# Patient Record
Sex: Female | Born: 1985 | Race: Black or African American | Hispanic: No | Marital: Single
Health system: Southern US, Academic
[De-identification: ages and names within clinical notes are randomized; demographics above are authoritative.]

## PROBLEM LIST (undated history)

## (undated) ENCOUNTER — Encounter: Attending: Adult Health | Primary: Adult Health

## (undated) ENCOUNTER — Telehealth

## (undated) ENCOUNTER — Encounter

## (undated) ENCOUNTER — Ambulatory Visit: Attending: Clinical | Primary: Clinical

## (undated) ENCOUNTER — Ambulatory Visit

## (undated) ENCOUNTER — Ambulatory Visit: Payer: PRIVATE HEALTH INSURANCE

## (undated) ENCOUNTER — Ambulatory Visit: Attending: Pharmacist | Primary: Pharmacist

## (undated) ENCOUNTER — Ambulatory Visit
Payer: BLUE CROSS/BLUE SHIELD | Attending: Student in an Organized Health Care Education/Training Program | Primary: Student in an Organized Health Care Education/Training Program

## (undated) ENCOUNTER — Ambulatory Visit: Payer: PRIVATE HEALTH INSURANCE | Attending: Adult Health | Primary: Adult Health

## (undated) ENCOUNTER — Encounter
Attending: Student in an Organized Health Care Education/Training Program | Primary: Student in an Organized Health Care Education/Training Program

## (undated) ENCOUNTER — Ambulatory Visit
Payer: BLUE CROSS/BLUE SHIELD | Attending: Rehabilitative and Restorative Service Providers" | Primary: Rehabilitative and Restorative Service Providers"

## (undated) ENCOUNTER — Ambulatory Visit: Payer: BLUE CROSS/BLUE SHIELD

## (undated) ENCOUNTER — Encounter: Attending: Registered" | Primary: Registered"

## (undated) ENCOUNTER — Encounter: Attending: Dermatology | Primary: Dermatology

## (undated) ENCOUNTER — Encounter: Attending: Addiction (Substance Use Disorder) | Primary: Addiction (Substance Use Disorder)

## (undated) ENCOUNTER — Ambulatory Visit: Payer: PRIVATE HEALTH INSURANCE | Attending: Registered" | Primary: Registered"

## (undated) ENCOUNTER — Encounter: Attending: Clinical | Primary: Clinical

## (undated) ENCOUNTER — Ambulatory Visit
Payer: PRIVATE HEALTH INSURANCE | Attending: Student in an Organized Health Care Education/Training Program | Primary: Student in an Organized Health Care Education/Training Program

## (undated) DIAGNOSIS — K859 Acute pancreatitis without necrosis or infection, unspecified: Secondary | ICD-10-CM

## (undated) DIAGNOSIS — L732 Hidradenitis suppurativa: Secondary | ICD-10-CM

## (undated) DIAGNOSIS — K219 Gastro-esophageal reflux disease without esophagitis: Secondary | ICD-10-CM

## (undated) DIAGNOSIS — F329 Major depressive disorder, single episode, unspecified: Secondary | ICD-10-CM

## (undated) DIAGNOSIS — K589 Irritable bowel syndrome without diarrhea: Secondary | ICD-10-CM

## (undated) DIAGNOSIS — F1210 Cannabis abuse, uncomplicated: Secondary | ICD-10-CM

## (undated) DIAGNOSIS — F32A Depression, unspecified: Secondary | ICD-10-CM

## (undated) DIAGNOSIS — F909 Attention-deficit hyperactivity disorder, unspecified type: Secondary | ICD-10-CM

## (undated) DIAGNOSIS — K2970 Gastritis, unspecified, without bleeding: Secondary | ICD-10-CM

## (undated) DIAGNOSIS — F419 Anxiety disorder, unspecified: Secondary | ICD-10-CM

## (undated) HISTORY — DX: Depression, unspecified: F32.A

## (undated) HISTORY — DX: Hidradenitis suppurativa: L73.2

## (undated) HISTORY — DX: Attention-deficit hyperactivity disorder, unspecified type: F90.9

## (undated) HISTORY — PX: WISDOM TOOTH EXTRACTION: SHX21

## (undated) HISTORY — PX: NO PAST SURGERIES: SHX2092

---
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
## 2008-06-21 ENCOUNTER — Emergency Department (HOSPITAL_COMMUNITY): Admission: EM | Admit: 2008-06-21 | Discharge: 2008-06-21 | Payer: Self-pay | Admitting: Emergency Medicine

## 2008-07-09 ENCOUNTER — Observation Stay (HOSPITAL_COMMUNITY): Admission: EM | Admit: 2008-07-09 | Discharge: 2008-07-10 | Payer: Self-pay | Admitting: Emergency Medicine

## 2008-07-10 ENCOUNTER — Emergency Department (HOSPITAL_COMMUNITY): Admission: EM | Admit: 2008-07-10 | Discharge: 2008-07-10 | Payer: Self-pay | Admitting: Emergency Medicine

## 2008-07-11 ENCOUNTER — Observation Stay (HOSPITAL_COMMUNITY): Admission: EM | Admit: 2008-07-11 | Discharge: 2008-07-12 | Payer: Self-pay | Admitting: Emergency Medicine

## 2009-01-18 ENCOUNTER — Emergency Department (HOSPITAL_BASED_OUTPATIENT_CLINIC_OR_DEPARTMENT_OTHER): Admission: EM | Admit: 2009-01-18 | Discharge: 2009-01-18 | Payer: Self-pay | Admitting: Emergency Medicine

## 2009-02-10 ENCOUNTER — Emergency Department (HOSPITAL_COMMUNITY): Admission: EM | Admit: 2009-02-10 | Discharge: 2009-02-10 | Payer: Self-pay | Admitting: Emergency Medicine

## 2009-03-03 ENCOUNTER — Emergency Department (HOSPITAL_BASED_OUTPATIENT_CLINIC_OR_DEPARTMENT_OTHER): Admission: EM | Admit: 2009-03-03 | Discharge: 2009-03-03 | Payer: Self-pay | Admitting: Emergency Medicine

## 2009-03-14 ENCOUNTER — Emergency Department (HOSPITAL_BASED_OUTPATIENT_CLINIC_OR_DEPARTMENT_OTHER): Admission: EM | Admit: 2009-03-14 | Discharge: 2009-03-15 | Payer: Self-pay | Admitting: Emergency Medicine

## 2009-04-02 ENCOUNTER — Ambulatory Visit: Payer: Self-pay | Admitting: Radiology

## 2009-04-02 ENCOUNTER — Emergency Department (HOSPITAL_BASED_OUTPATIENT_CLINIC_OR_DEPARTMENT_OTHER): Admission: EM | Admit: 2009-04-02 | Discharge: 2009-04-02 | Payer: Self-pay | Admitting: Emergency Medicine

## 2009-04-03 ENCOUNTER — Emergency Department (HOSPITAL_COMMUNITY): Admission: EM | Admit: 2009-04-03 | Discharge: 2009-04-03 | Payer: Self-pay | Admitting: Emergency Medicine

## 2010-07-10 LAB — CBC
HCT: 41.8 % (ref 36.0–46.0)
Hemoglobin: 14 g/dL (ref 12.0–15.0)
MCHC: 33.4 g/dL (ref 30.0–36.0)
MCV: 89.6 fL (ref 78.0–100.0)
Platelets: 182 10*3/uL (ref 150–400)
RBC: 4.67 MIL/uL (ref 3.87–5.11)
RDW: 13.1 % (ref 11.5–15.5)
WBC: 16.8 10*3/uL — ABNORMAL HIGH (ref 4.0–10.5)

## 2010-07-10 LAB — COMPREHENSIVE METABOLIC PANEL
ALT: 20 U/L (ref 0–35)
AST: 21 U/L (ref 0–37)
Albumin: 4.7 g/dL (ref 3.5–5.2)
Alkaline Phosphatase: 60 U/L (ref 39–117)
BUN: 13 mg/dL (ref 6–23)
CO2: 26 mEq/L (ref 19–32)
Calcium: 9.5 mg/dL (ref 8.4–10.5)
Chloride: 102 mEq/L (ref 96–112)
Creatinine, Ser: 0.7 mg/dL (ref 0.4–1.2)
GFR calc Af Amer: >60 mL/min (ref >60)
GFR calc non Af Amer: >60 mL/min (ref >60)
Glucose, Bld: 134 mg/dL — ABNORMAL HIGH (ref 70–99)
Potassium: 3.4 mEq/L — ABNORMAL LOW (ref 3.5–5.1)
Sodium: 141 mEq/L (ref 135–145)
Total Bilirubin: 1.8 mg/dL — ABNORMAL HIGH (ref 0.3–1.2)
Total Protein: 7.5 g/dL (ref 6.0–8.3)

## 2010-07-10 LAB — PROTIME-INR
INR: 1.08 (ref 0.00–1.49)
Prothrombin Time: 13.9 seconds (ref 11.6–15.2)

## 2010-07-10 LAB — DIFFERENTIAL
Basophils Absolute: 0.2 10*3/uL — ABNORMAL HIGH (ref 0.0–0.1)
Basophils Relative: 1 % (ref 0–1)
Eosinophils Absolute: 0 10*3/uL (ref 0.0–0.7)
Eosinophils Relative: 0 % (ref 0–5)
Lymphocytes Relative: 12 % (ref 12–46)
Lymphs Abs: 2 10*3/uL (ref 0.7–4.0)
Monocytes Absolute: 0.7 10*3/uL (ref 0.1–1.0)
Monocytes Relative: 4 % (ref 3–12)
Neutro Abs: 13.9 10*3/uL — ABNORMAL HIGH (ref 1.7–7.7)
Neutrophils Relative %: 83 % — ABNORMAL HIGH (ref 43–77)

## 2010-07-10 LAB — URINE MICROSCOPIC-ADD ON

## 2010-07-10 LAB — URINALYSIS, ROUTINE W REFLEX MICROSCOPIC
Bilirubin Urine: NEGATIVE
Glucose, UA: NEGATIVE mg/dL
Hgb urine dipstick: NEGATIVE
Ketones, ur: NEGATIVE mg/dL
Leukocytes, UA: NEGATIVE
Nitrite: NEGATIVE
Protein, ur: NEGATIVE mg/dL
Specific Gravity, Urine: 1.024 (ref 1.005–1.030)
Urobilinogen, UA: 1 mg/dL (ref 0.0–1.0)
pH: 8.5 — ABNORMAL HIGH (ref 5.0–8.0)

## 2010-07-10 LAB — ETHANOL: Alcohol, Ethyl (B): <5 mg/dL (ref 0–10)

## 2010-07-10 LAB — LIPASE, BLOOD: Lipase: 86 U/L (ref 23–300)

## 2010-07-10 LAB — PREGNANCY, URINE: Preg Test, Ur: NEGATIVE

## 2010-07-11 LAB — CBC
HCT: 42.3 % (ref 36.0–46.0)
Hemoglobin: 14.1 g/dL (ref 12.0–15.0)
MCHC: 33.3 g/dL (ref 30.0–36.0)
MCV: 90 fL (ref 78.0–100.0)
Platelets: 177 10*3/uL (ref 150–400)
RBC: 4.7 MIL/uL (ref 3.87–5.11)
RDW: 13.4 % (ref 11.5–15.5)
WBC: 18.2 10*3/uL — ABNORMAL HIGH (ref 4.0–10.5)

## 2010-07-11 LAB — URINALYSIS, ROUTINE W REFLEX MICROSCOPIC
Bilirubin Urine: NEGATIVE
Glucose, UA: NEGATIVE mg/dL
Hgb urine dipstick: NEGATIVE
Ketones, ur: NEGATIVE mg/dL
Nitrite: NEGATIVE
Protein, ur: NEGATIVE mg/dL
Specific Gravity, Urine: 1.019 (ref 1.005–1.030)
Urobilinogen, UA: 0.2 mg/dL (ref 0.0–1.0)
pH: 7.5 (ref 5.0–8.0)

## 2010-07-11 LAB — DIFFERENTIAL
Basophils Absolute: 0.4 10*3/uL — ABNORMAL HIGH (ref 0.0–0.1)
Basophils Relative: 2 % — ABNORMAL HIGH (ref 0–1)
Eosinophils Absolute: 0 10*3/uL (ref 0.0–0.7)
Eosinophils Relative: 0 % (ref 0–5)
Lymphocytes Relative: 11 % — ABNORMAL LOW (ref 12–46)
Lymphs Abs: 2 10*3/uL (ref 0.7–4.0)
Monocytes Absolute: 0.5 10*3/uL (ref 0.1–1.0)
Monocytes Relative: 3 % (ref 3–12)
Neutro Abs: 15.3 10*3/uL — ABNORMAL HIGH (ref 1.7–7.7)
Neutrophils Relative %: 84 % — ABNORMAL HIGH (ref 43–77)

## 2010-07-11 LAB — COMPREHENSIVE METABOLIC PANEL
ALT: 21 U/L (ref 0–35)
AST: 18 U/L (ref 0–37)
Albumin: 4.4 g/dL (ref 3.5–5.2)
Alkaline Phosphatase: 54 U/L (ref 39–117)
BUN: 8 mg/dL (ref 6–23)
CO2: 27 mEq/L (ref 19–32)
Calcium: 9.2 mg/dL (ref 8.4–10.5)
Chloride: 104 mEq/L (ref 96–112)
Creatinine, Ser: 0.8 mg/dL (ref 0.4–1.2)
GFR calc Af Amer: >60 mL/min (ref >60)
GFR calc non Af Amer: >60 mL/min (ref >60)
Glucose, Bld: 130 mg/dL — ABNORMAL HIGH (ref 70–99)
Potassium: 3.3 mEq/L — ABNORMAL LOW (ref 3.5–5.1)
Sodium: 142 mEq/L (ref 135–145)
Total Bilirubin: 0.9 mg/dL (ref 0.3–1.2)
Total Protein: 7.5 g/dL (ref 6.0–8.3)

## 2010-07-11 LAB — LIPASE, BLOOD: Lipase: 77 U/L (ref 23–300)

## 2010-07-11 LAB — PREGNANCY, URINE: Preg Test, Ur: NEGATIVE

## 2010-07-12 LAB — DIFFERENTIAL
Basophils Absolute: 0.1 10*3/uL (ref 0.0–0.1)
Basophils Absolute: 0 K/uL (ref 0.0–0.1)
Basophils Relative: 1 % (ref 0–1)
Basophils Relative: 0 % (ref 0–1)
Eosinophils Absolute: 0.1 10*3/uL (ref 0.0–0.7)
Eosinophils Absolute: 0 10*3/uL (ref 0.0–0.7)
Eosinophils Relative: 1 % (ref 0–5)
Eosinophils Relative: 0 % (ref 0–5)
Lymphocytes Relative: 28 % (ref 12–46)
Lymphocytes Relative: 13 % (ref 12–46)
Lymphs Abs: 2.9 10*3/uL (ref 0.7–4.0)
Lymphs Abs: 1.3 10*3/uL (ref 0.7–4.0)
Monocytes Absolute: 0.7 10*3/uL (ref 0.1–1.0)
Monocytes Absolute: 0.4 10*3/uL (ref 0.1–1.0)
Monocytes Relative: 7 % (ref 3–12)
Monocytes Relative: 4 % (ref 3–12)
Neutro Abs: 6.5 10*3/uL (ref 1.7–7.7)
Neutro Abs: 8 K/uL — ABNORMAL HIGH (ref 1.7–7.7)
Neutrophils Relative %: 64 % (ref 43–77)
Neutrophils Relative %: 83 % — ABNORMAL HIGH (ref 43–77)

## 2010-07-12 LAB — COMPREHENSIVE METABOLIC PANEL
ALT: 16 U/L (ref 0–35)
ALT: 16 U/L (ref 0–35)
AST: 21 U/L (ref 0–37)
AST: 23 U/L (ref 0–37)
Albumin: 4.7 g/dL (ref 3.5–5.2)
Albumin: 4.4 g/dL (ref 3.5–5.2)
Alkaline Phosphatase: 55 U/L (ref 39–117)
BUN: 13 mg/dL (ref 6–23)
CO2: 28 mEq/L (ref 19–32)
Calcium: 9.6 mg/dL (ref 8.4–10.5)
Calcium: 9.2 mg/dL (ref 8.4–10.5)
Chloride: 101 mEq/L (ref 96–112)
Chloride: 106 mEq/L (ref 96–112)
Creatinine, Ser: 0.8 mg/dL (ref 0.4–1.2)
Creatinine, Ser: 0.78 mg/dL (ref 0.4–1.2)
GFR calc Af Amer: >60 mL/min (ref >60)
GFR calc Af Amer: >60 mL/min (ref >60)
GFR calc non Af Amer: >60 mL/min (ref >60)
Glucose, Bld: 99 mg/dL (ref 70–99)
Potassium: 3.8 mEq/L (ref 3.5–5.1)
Sodium: 143 mEq/L (ref 135–145)
Sodium: 138 mEq/L (ref 135–145)
Total Bilirubin: 1.4 mg/dL — ABNORMAL HIGH (ref 0.3–1.2)
Total Protein: 7.9 g/dL (ref 6.0–8.3)

## 2010-07-12 LAB — COMPREHENSIVE METABOLIC PANEL WITH GFR
Alkaline Phosphatase: 47 U/L (ref 39–117)
BUN: 7 mg/dL (ref 6–23)
CO2: 24 meq/L (ref 19–32)
GFR calc non Af Amer: >60 mL/min (ref >60)
Glucose, Bld: 135 mg/dL — ABNORMAL HIGH (ref 70–99)
Potassium: 3.6 meq/L (ref 3.5–5.1)
Total Bilirubin: 1.2 mg/dL (ref 0.3–1.2)
Total Protein: 7.5 g/dL (ref 6.0–8.3)

## 2010-07-12 LAB — CBC
HCT: 43.4 % (ref 36.0–46.0)
HCT: 42.2 % (ref 36.0–46.0)
Hemoglobin: 14.7 g/dL (ref 12.0–15.0)
Hemoglobin: 14.4 g/dL (ref 12.0–15.0)
MCHC: 34 g/dL (ref 30.0–36.0)
MCHC: 34.2 g/dL (ref 30.0–36.0)
MCV: 89 fL (ref 78.0–100.0)
MCV: 90.2 fL (ref 78.0–100.0)
Platelets: 192 10*3/uL (ref 150–400)
Platelets: 172 10*3/uL (ref 150–400)
RBC: 4.88 MIL/uL (ref 3.87–5.11)
RBC: 4.67 MIL/uL (ref 3.87–5.11)
RDW: 13.4 % (ref 11.5–15.5)
RDW: 13.9 % (ref 11.5–15.5)
WBC: 10.3 10*3/uL (ref 4.0–10.5)
WBC: 9.7 10*3/uL (ref 4.0–10.5)

## 2010-07-12 LAB — ETHANOL: Alcohol, Ethyl (B): <5 mg/dL (ref 0–10)

## 2010-07-12 LAB — URINALYSIS, ROUTINE W REFLEX MICROSCOPIC
Bilirubin Urine: NEGATIVE
Glucose, UA: NEGATIVE mg/dL
Hgb urine dipstick: NEGATIVE
Ketones, ur: 15 mg/dL — AB
Leukocytes, UA: NEGATIVE
Protein, ur: 30 mg/dL — AB
pH: 7 (ref 5.0–8.0)

## 2010-07-12 LAB — URINE MICROSCOPIC-ADD ON

## 2010-07-12 LAB — LIPASE, BLOOD
Lipase: 68 U/L (ref 23–300)
Lipase: 21 U/L (ref 11–59)

## 2010-07-13 LAB — COMPREHENSIVE METABOLIC PANEL
ALT: 9 U/L (ref 0–35)
BUN: 17 mg/dL (ref 6–23)
CO2: 27 mEq/L (ref 19–32)
Calcium: 9.7 mg/dL (ref 8.4–10.5)
Creatinine, Ser: 0.8 mg/dL (ref 0.4–1.2)
GFR calc non Af Amer: >60 mL/min (ref >60)
Glucose, Bld: 159 mg/dL — ABNORMAL HIGH (ref 70–99)

## 2010-07-13 LAB — CBC
HCT: 41.6 % (ref 36.0–46.0)
Hemoglobin: 14.1 g/dL (ref 12.0–15.0)
MCHC: 33.9 g/dL (ref 30.0–36.0)
MCV: 89.3 fL (ref 78.0–100.0)
RBC: 4.31 MIL/uL (ref 3.87–5.11)
RBC: 4.66 MIL/uL (ref 3.87–5.11)
WBC: 14.5 10*3/uL — ABNORMAL HIGH (ref 4.0–10.5)

## 2010-07-13 LAB — URINALYSIS, ROUTINE W REFLEX MICROSCOPIC
Hgb urine dipstick: NEGATIVE
Protein, ur: 30 mg/dL — AB
Urobilinogen, UA: 0.2 mg/dL (ref 0.0–1.0)

## 2010-07-13 LAB — DIFFERENTIAL
Eosinophils Relative: 0 % (ref 0–5)
Lymphocytes Relative: 11 % — ABNORMAL LOW (ref 12–46)
Lymphocytes Relative: 7 % — ABNORMAL LOW (ref 12–46)
Lymphs Abs: 1.1 10*3/uL (ref 0.7–4.0)
Monocytes Absolute: 0.9 10*3/uL (ref 0.1–1.0)
Monocytes Relative: 1 % — ABNORMAL LOW (ref 3–12)
Monocytes Relative: 4 % (ref 3–12)
Neutro Abs: 13.1 10*3/uL — ABNORMAL HIGH (ref 1.7–7.7)
Neutrophils Relative %: 82 % — ABNORMAL HIGH (ref 43–77)
Neutrophils Relative %: 91 % — ABNORMAL HIGH (ref 43–77)

## 2010-07-13 LAB — URINE MICROSCOPIC-ADD ON

## 2010-07-13 LAB — PREGNANCY, URINE: Preg Test, Ur: NEGATIVE

## 2010-07-19 LAB — DIFFERENTIAL
Basophils Absolute: 0 10*3/uL (ref 0.0–0.1)
Basophils Absolute: 0 10*3/uL (ref 0.0–0.1)
Basophils Relative: 0 % (ref 0–1)
Eosinophils Relative: 0 % (ref 0–5)
Lymphocytes Relative: 11 % — ABNORMAL LOW (ref 12–46)
Lymphs Abs: 1.1 10*3/uL (ref 0.7–4.0)
Lymphs Abs: 2.3 10*3/uL (ref 0.7–4.0)
Neutro Abs: 8.2 10*3/uL — ABNORMAL HIGH (ref 1.7–7.7)
Neutro Abs: 8.7 10*3/uL — ABNORMAL HIGH (ref 1.7–7.7)
Neutrophils Relative %: 73 % (ref 43–77)
Neutrophils Relative %: 85 % — ABNORMAL HIGH (ref 43–77)

## 2010-07-19 LAB — URINALYSIS, ROUTINE W REFLEX MICROSCOPIC
Bilirubin Urine: NEGATIVE
Bilirubin Urine: NEGATIVE
Glucose, UA: NEGATIVE mg/dL
Hgb urine dipstick: NEGATIVE
Ketones, ur: 40 mg/dL — AB
Ketones, ur: >80 mg/dL — AB
Leukocytes, UA: NEGATIVE
Nitrite: NEGATIVE
Specific Gravity, Urine: 1.027 (ref 1.005–1.030)
pH: 6 (ref 5.0–8.0)
pH: 8 (ref 5.0–8.0)

## 2010-07-19 LAB — URINE MICROSCOPIC-ADD ON

## 2010-07-19 LAB — COMPREHENSIVE METABOLIC PANEL
AST: 24 U/L (ref 0–37)
BUN: 6 mg/dL (ref 6–23)
CO2: 25 mEq/L (ref 19–32)
Calcium: 9 mg/dL (ref 8.4–10.5)
Chloride: 104 mEq/L (ref 96–112)
Creatinine, Ser: 0.76 mg/dL (ref 0.4–1.2)
GFR calc Af Amer: >60 mL/min (ref >60)
GFR calc non Af Amer: >60 mL/min (ref >60)
Glucose, Bld: 125 mg/dL — ABNORMAL HIGH (ref 70–99)
Total Bilirubin: 2.1 mg/dL — ABNORMAL HIGH (ref 0.3–1.2)

## 2010-07-19 LAB — CBC
HCT: 38.8 % (ref 36.0–46.0)
MCHC: 33.8 g/dL (ref 30.0–36.0)
MCV: 88.8 fL (ref 78.0–100.0)
MCV: 89.8 fL (ref 78.0–100.0)
Platelets: 170 10*3/uL (ref 150–400)
RBC: 4.32 MIL/uL (ref 3.87–5.11)
RBC: 4.5 MIL/uL (ref 3.87–5.11)
WBC: 11.2 10*3/uL — ABNORMAL HIGH (ref 4.0–10.5)

## 2010-07-19 LAB — HEPATIC FUNCTION PANEL
ALT: 21 U/L (ref 0–35)
Albumin: 4.6 g/dL (ref 3.5–5.2)
Alkaline Phosphatase: 47 U/L (ref 39–117)
Total Protein: 7.7 g/dL (ref 6.0–8.3)

## 2010-07-19 LAB — LIPASE, BLOOD: Lipase: 55 U/L (ref 11–59)

## 2010-07-19 LAB — POCT I-STAT, CHEM 8
BUN: 12 mg/dL (ref 6–23)
Calcium, Ion: 1.08 mmol/L — ABNORMAL LOW (ref 1.12–1.32)
Chloride: 104 mEq/L (ref 96–112)
Glucose, Bld: 133 mg/dL — ABNORMAL HIGH (ref 70–99)

## 2010-07-19 LAB — BASIC METABOLIC PANEL
BUN: 11 mg/dL (ref 6–23)
Calcium: 9 mg/dL (ref 8.4–10.5)
Creatinine, Ser: 0.79 mg/dL (ref 0.4–1.2)
GFR calc Af Amer: >60 mL/min (ref >60)
GFR calc non Af Amer: >60 mL/min (ref >60)

## 2010-07-19 LAB — ETHANOL
Alcohol, Ethyl (B): <5 mg/dL (ref 0–10)
Alcohol, Ethyl (B): <5 mg/dL (ref 0–10)

## 2010-08-25 ENCOUNTER — Inpatient Hospital Stay (INDEPENDENT_AMBULATORY_CARE_PROVIDER_SITE_OTHER)
Admission: RE | Admit: 2010-08-25 | Discharge: 2010-08-25 | Disposition: A | Discharge status: Home / Self Care | Payer: Managed Care, Other (non HMO) | Source: Ambulatory Visit | Attending: Family Medicine | Admitting: Family Medicine

## 2010-08-25 DIAGNOSIS — IMO0002 Reserved for concepts with insufficient information to code with codable children: Secondary | ICD-10-CM

## 2010-08-28 LAB — CULTURE, ROUTINE-ABSCESS: Culture: NO GROWTH

## 2010-09-05 ENCOUNTER — Inpatient Hospital Stay (HOSPITAL_COMMUNITY)
Admission: RE | Admit: 2010-09-05 | Discharge: 2010-09-05 | Disposition: A | Discharge status: Home / Self Care | Payer: Managed Care, Other (non HMO) | Source: Ambulatory Visit | Attending: Family Medicine | Admitting: Family Medicine

## 2010-11-22 ENCOUNTER — Other Ambulatory Visit (HOSPITAL_COMMUNITY): Payer: Managed Care, Other (non HMO)

## 2010-11-22 ENCOUNTER — Emergency Department (HOSPITAL_COMMUNITY): Payer: Managed Care, Other (non HMO)

## 2010-11-22 ENCOUNTER — Emergency Department (HOSPITAL_COMMUNITY)
Admission: EM | Admit: 2010-11-22 | Discharge: 2010-11-22 | Disposition: A | Discharge status: Home / Self Care | Payer: Managed Care, Other (non HMO) | Attending: Emergency Medicine | Admitting: Emergency Medicine

## 2010-11-22 DIAGNOSIS — R10819 Abdominal tenderness, unspecified site: Secondary | ICD-10-CM | POA: Insufficient documentation

## 2010-11-22 DIAGNOSIS — F17200 Nicotine dependence, unspecified, uncomplicated: Secondary | ICD-10-CM | POA: Insufficient documentation

## 2010-11-22 DIAGNOSIS — R1013 Epigastric pain: Secondary | ICD-10-CM | POA: Insufficient documentation

## 2010-11-22 DIAGNOSIS — R112 Nausea with vomiting, unspecified: Secondary | ICD-10-CM | POA: Insufficient documentation

## 2010-11-22 DIAGNOSIS — K2970 Gastritis, unspecified, without bleeding: Secondary | ICD-10-CM | POA: Insufficient documentation

## 2010-11-22 LAB — URINALYSIS, ROUTINE W REFLEX MICROSCOPIC
Leukocytes, UA: NEGATIVE
Nitrite: NEGATIVE
Protein, ur: NEGATIVE mg/dL
Specific Gravity, Urine: 1.025 (ref 1.005–1.030)
Urobilinogen, UA: 1 mg/dL (ref 0.0–1.0)

## 2010-11-22 LAB — COMPREHENSIVE METABOLIC PANEL
CO2: 28 mEq/L (ref 19–32)
Calcium: 9.5 mg/dL (ref 8.4–10.5)
Creatinine, Ser: 0.7 mg/dL (ref 0.50–1.10)
GFR calc Af Amer: >60 mL/min (ref >60)
GFR calc non Af Amer: >60 mL/min (ref >60)
Glucose, Bld: 120 mg/dL — ABNORMAL HIGH (ref 70–99)
Sodium: 138 mEq/L (ref 135–145)
Total Protein: 7.2 g/dL (ref 6.0–8.3)

## 2010-11-22 LAB — DIFFERENTIAL
Basophils Absolute: 0 10*3/uL (ref 0.0–0.1)
Basophils Relative: 0 % (ref 0–1)
Eosinophils Relative: 1 % (ref 0–5)
Monocytes Absolute: 0.5 10*3/uL (ref 0.1–1.0)
Monocytes Relative: 6 % (ref 3–12)
Neutro Abs: 5.5 10*3/uL (ref 1.7–7.7)

## 2010-11-22 LAB — CBC
Hemoglobin: 13.6 g/dL (ref 12.0–15.0)
MCH: 30.4 pg (ref 26.0–34.0)
MCHC: 34.9 g/dL (ref 30.0–36.0)
RDW: 13.1 % (ref 11.5–15.5)

## 2010-11-22 LAB — POCT PREGNANCY, URINE: Preg Test, Ur: NEGATIVE

## 2010-11-22 MED ORDER — IOHEXOL 300 MG/ML  SOLN
100.0000 mL | Freq: Once | INTRAMUSCULAR | Status: AC | PRN
  Administered 2010-11-22: 100 mL via INTRAVENOUS

## 2010-11-24 ENCOUNTER — Emergency Department (HOSPITAL_COMMUNITY)
Admission: EM | Admit: 2010-11-24 | Discharge: 2010-11-24 | Discharge status: Against Medical Advice | Payer: Managed Care, Other (non HMO) | Attending: Emergency Medicine | Admitting: Emergency Medicine

## 2010-11-24 ENCOUNTER — Inpatient Hospital Stay (HOSPITAL_COMMUNITY)
Admission: AD | Admit: 2010-11-24 | Discharge: 2010-11-25 | Disposition: A | Discharge status: Home / Self Care | Payer: Managed Care, Other (non HMO) | Source: Ambulatory Visit | Attending: Obstetrics & Gynecology | Admitting: Obstetrics & Gynecology

## 2010-11-24 ENCOUNTER — Encounter (HOSPITAL_COMMUNITY): Payer: Self-pay | Admitting: *Deleted

## 2010-11-24 DIAGNOSIS — IMO0002 Reserved for concepts with insufficient information to code with codable children: Secondary | ICD-10-CM | POA: Insufficient documentation

## 2010-11-24 DIAGNOSIS — R109 Unspecified abdominal pain: Secondary | ICD-10-CM | POA: Insufficient documentation

## 2010-11-24 DIAGNOSIS — F411 Generalized anxiety disorder: Secondary | ICD-10-CM | POA: Insufficient documentation

## 2010-11-24 DIAGNOSIS — R1013 Epigastric pain: Secondary | ICD-10-CM | POA: Insufficient documentation

## 2010-11-24 DIAGNOSIS — F17200 Nicotine dependence, unspecified, uncomplicated: Secondary | ICD-10-CM | POA: Insufficient documentation

## 2010-11-24 HISTORY — DX: Gastro-esophageal reflux disease without esophagitis: K21.9

## 2010-11-24 HISTORY — DX: Acute pancreatitis without necrosis or infection, unspecified: K85.90

## 2010-11-24 LAB — COMPREHENSIVE METABOLIC PANEL
ALT: 14 U/L (ref 0–35)
AST: 15 U/L (ref 0–37)
Albumin: 4.4 g/dL (ref 3.5–5.2)
Alkaline Phosphatase: 38 U/L — ABNORMAL LOW (ref 39–117)
Alkaline Phosphatase: 38 U/L — ABNORMAL LOW (ref 39–117)
BUN: 9 mg/dL (ref 6–23)
CO2: 26 mEq/L (ref 19–32)
Calcium: 9.4 mg/dL (ref 8.4–10.5)
Calcium: 9.1 mg/dL (ref 8.4–10.5)
Chloride: 100 mEq/L (ref 96–112)
Creatinine, Ser: 0.61 mg/dL (ref 0.50–1.10)
GFR calc Af Amer: >60 mL/min (ref >60)
GFR calc non Af Amer: >60 mL/min (ref >60)
Glucose, Bld: 130 mg/dL — ABNORMAL HIGH (ref 70–99)
Potassium: 3.1 mEq/L — ABNORMAL LOW (ref 3.5–5.1)
Sodium: 139 mEq/L (ref 135–145)
Total Protein: 6.9 g/dL (ref 6.0–8.3)

## 2010-11-24 LAB — URINALYSIS, ROUTINE W REFLEX MICROSCOPIC
Bilirubin Urine: NEGATIVE
Hgb urine dipstick: NEGATIVE
Nitrite: NEGATIVE
Protein, ur: NEGATIVE mg/dL
Specific Gravity, Urine: 1.025 (ref 1.005–1.030)
Urobilinogen, UA: 1 mg/dL (ref 0.0–1.0)

## 2010-11-24 LAB — DIFFERENTIAL
Basophils Absolute: 0 10*3/uL (ref 0.0–0.1)
Basophils Relative: 0 % (ref 0–1)
Eosinophils Absolute: 0 10*3/uL (ref 0.0–0.7)
Neutro Abs: 10.6 10*3/uL — ABNORMAL HIGH (ref 1.7–7.7)
Neutrophils Relative %: 93 % — ABNORMAL HIGH (ref 43–77)

## 2010-11-24 LAB — CBC
HCT: 37.9 % (ref 36.0–46.0)
MCH: 29.8 pg (ref 26.0–34.0)
MCHC: 34.3 g/dL (ref 30.0–36.0)
MCV: 87.9 fL (ref 78.0–100.0)
Platelets: 180 10*3/uL (ref 150–400)
Platelets: 172 10*3/uL (ref 150–400)
RBC: 4.39 MIL/uL (ref 3.87–5.11)
RDW: 13.3 % (ref 11.5–15.5)
WBC: 11.4 10*3/uL — ABNORMAL HIGH (ref 4.0–10.5)
WBC: 11.2 10*3/uL — ABNORMAL HIGH (ref 4.0–10.5)

## 2010-11-24 LAB — WET PREP, GENITAL
Trich, Wet Prep: NONE SEEN
Yeast Wet Prep HPF POC: NONE SEEN

## 2010-11-24 MED ORDER — DEXTROSE IN LACTATED RINGERS 5 % IV SOLN
INTRAVENOUS | Status: DC
  Administered 2010-11-24: 22:00:00 via INTRAVENOUS

## 2010-11-24 MED ORDER — FAMOTIDINE IN NACL 20-0.9 MG/50ML-% IV SOLN
20.0000 mg | Freq: Two times a day (BID) | INTRAVENOUS | Status: DC
  Administered 2010-11-24: 23:00:00 via INTRAVENOUS
  Filled 2010-11-24: qty 50

## 2010-11-24 MED ORDER — KETOROLAC TROMETHAMINE 60 MG/2ML IM SOLN
60.0000 mg | Freq: Once | INTRAMUSCULAR | Status: AC
  Administered 2010-11-24: 60 mg via INTRAMUSCULAR
  Filled 2010-11-24: qty 2

## 2010-11-24 MED ORDER — ONDANSETRON HCL 4 MG/2ML IJ SOLN
4.0000 mg | Freq: Once | INTRAMUSCULAR | Status: AC
  Administered 2010-11-24: 4 mg via INTRAVENOUS
  Filled 2010-11-24: qty 2

## 2010-11-24 MED ORDER — LACTATED RINGERS IV SOLN
INTRAVENOUS | Status: DC
  Administered 2010-11-24: 23:00:00 via INTRAVENOUS

## 2010-11-24 MED ORDER — MORPHINE SULFATE 4 MG/ML IJ SOLN
2.0000 mg | Freq: Once | INTRAMUSCULAR | Status: AC
  Administered 2010-11-24: 2 mg via INTRAVENOUS
  Filled 2010-11-24: qty 1

## 2010-11-24 MED ORDER — PROMETHAZINE HCL 25 MG/ML IJ SOLN
12.5000 mg | Freq: Once | INTRAMUSCULAR | Status: AC
  Administered 2010-11-24: 12.5 mg via INTRAVENOUS
  Filled 2010-11-24: qty 1

## 2010-11-24 NOTE — ED Provider Notes (Signed)
Chief Complaint:  Abdominal Pain   Samantha Noble is  25 y.o. G0P0.  Patient's last menstrual period was 11/07/2010.Marland Kitchen  Her pregnancy status is negative.  She presents complaining of Abdominal Pain Sudden onset of pain Wednesday immediately after eating [employer]. Reports being treated for GERD and Gastritis with resolve of symptoms. Pain returned at 1pm today after eating. Onset is described as sudden and has been intermittent for  3 days. Pt was seen at [facility] ~2 prior to arrival in [facility] with same complaints, she was treated with Reglan and Norco. States pain has not improved and she reports coming to [facility]U because her pain was not better and she had to wait too long in the other ED.   Obstetrical/Gynecological History: OB History    Grav Para Term Preterm Abortions TAB SAB Ect Mult Living   0               Past Medical History: Past Medical History  Diagnosis Date  . Acid reflux disease   . Pancreatitis     Past Surgical History: Past Surgical History  Procedure Date  . No past surgeries     Family History: No family history on file.  Social History: History  Substance Use Topics  . Smoking status: Current Everyday Smoker -- 2 years    Types: Cigars  . Smokeless tobacco: Not on file  . Alcohol Use: No    Allergies: No Known Allergies  Prescriptions prior to admission  Medication Sig Dispense Refill  . ibuprofen (ADVIL,MOTRIN) 200 MG tablet Take 200 mg by mouth every 6 (six) hours as needed. pain       . HYDROcodone-acetaminophen (NORCO) 5-325 MG per tablet Take 1 tablet by mouth at bedtime as needed. For pain       . promethazine (PHENERGAN) 25 MG tablet Take 25 mg by mouth every 6 (six) hours as needed. For nausea       . ranitidine (ZANTAC) 150 MG tablet Take 150 mg by mouth every 12 (twelve) hours.          Review of Systems - History obtained from chart review and the patient Gastrointestinal ROS: positive for - abdominal pain, nausea/vomiting and  peri-umbilical pain  Physical Exam   Blood pressure 113/69, pulse 64, temperature 99.4 F (37.4 C), temperature source Oral, resp. rate 20, height 5' 3.5" (1.613 m), weight 55.792 kg (123 lb), last menstrual period 11/07/2010.  General: General appearance - anxious, crying and uncooperative Mental status - affect inappropriate historonic Abdomen - soft, nontender, nondistended, no masses or organomegaly no rebound tenderness noted bowel sounds normal no bladder distension noted no CVA tenderness Extremities - peripheral pulses normal, no pedal edema, no clubbing or cyanosis Skin - normal coloration and turgor, no rashes, no suspicious skin lesions noted Focused Gynecological Exam: normal external genitalia, vulva, vagina, cervix, uterus and adnexa, no CMT, uterus & adnexa non-enlarged, nontender  Labs: Recent Results (from the past 24 hour(s))  DIFFERENTIAL   Collection Time   11/24/10  7:42 PM      Component Value Range   Neutrophils Relative 93 (*) 43 - 77 (%)   Neutro Abs 10.6 (*) 1.7 - 7.7 (K/uL)   Lymphocytes Relative 6 (*) 12 - 46 (%)   Lymphs Abs 0.7  0.7 - 4.0 (K/uL)   Monocytes Relative 1 (*) 3 - 12 (%)   Monocytes Absolute 0.1  0.1 - 1.0 (K/uL)   Eosinophils Relative 0  0 - 5 (%)   Eosinophils  Absolute 0.0  0.0 - 0.7 (K/uL)   Basophils Relative 0  0 - 1 (%)   Basophils Absolute 0.0  0.0 - 0.1 (K/uL)  CBC   Collection Time   11/24/10  7:42 PM      Component Value Range   WBC 11.4 (*) 4.0 - 10.5 (K/uL)   RBC 4.39  3.87 - 5.11 (MIL/uL)   Hemoglobin 13.1  12.0 - 15.0 (g/dL)   HCT 16.1  09.6 - 04.5 (%)   MCV 88.4  78.0 - 100.0 (fL)   MCH 29.8  26.0 - 34.0 (pg)   MCHC 33.8  30.0 - 36.0 (g/dL)   RDW 40.9  81.1 - 91.4 (%)   Platelets 180  150 - 400 (K/uL)  COMPREHENSIVE METABOLIC PANEL   Collection Time   11/24/10  7:42 PM      Component Value Range   Sodium 139  135 - 145 (mEq/L)   Potassium 3.1 (*) 3.5 - 5.1 (mEq/L)   Chloride 100  96 - 112 (mEq/L)   CO2 26  19  - 32 (mEq/L)   Glucose, Bld 125 (*) 70 - 99 (mg/dL)   BUN 9  6 - 23 (mg/dL)   Creatinine, Ser 7.82  0.50 - 1.10 (mg/dL)   Calcium 9.4  8.4 - 95.6 (mg/dL)   Total Protein 7.3  6.0 - 8.3 (g/dL)   Albumin 4.4  3.5 - 5.2 (g/dL)   AST 15  0 - 37 (U/L)   ALT 14  0 - 35 (U/L)   Alkaline Phosphatase 38 (*) 39 - 117 (U/L)   Total Bilirubin 1.2  0.3 - 1.2 (mg/dL)   GFR calc non Af Amer >60  >60 (mL/min)   GFR calc Af Amer >60  >60 (mL/min)  LIPASE, BLOOD   Collection Time   11/24/10  7:42 PM      Component Value Range   Lipase 14  11 - 59 (U/L)  URINALYSIS, ROUTINE W REFLEX MICROSCOPIC   Collection Time   11/24/10  9:10 PM      Component Value Range   Color, Urine YELLOW  YELLOW    Appearance CLEAR  CLEAR    Specific Gravity, Urine 1.025  1.005 - 1.030    pH 7.0  5.0 - 8.0    Glucose, UA NEGATIVE  NEGATIVE (mg/dL)   Hgb urine dipstick NEGATIVE  NEGATIVE    Bilirubin Urine NEGATIVE  NEGATIVE    Ketones, ur >80 (*) NEGATIVE (mg/dL)   Protein, ur NEGATIVE  NEGATIVE (mg/dL)   Urobilinogen, UA 1.0  0.0 - 1.0 (mg/dL)   Nitrite NEGATIVE  NEGATIVE    Leukocytes, UA NEGATIVE  NEGATIVE   POCT PREGNANCY, URINE   Collection Time   11/24/10  9:21 PM      Component Value Range   Preg Test, Ur NEGATIVE    CBC   Collection Time   11/24/10  9:33 PM      Component Value Range   WBC 11.2 (*) 4.0 - 10.5 (K/uL)   RBC 4.31  3.87 - 5.11 (MIL/uL)   Hemoglobin 13.0  12.0 - 15.0 (g/dL)   HCT 21.3  08.6 - 57.8 (%)   MCV 87.9  78.0 - 100.0 (fL)   MCH 30.2  26.0 - 34.0 (pg)   MCHC 34.3  30.0 - 36.0 (g/dL)   RDW 46.9  62.9 - 52.8 (%)   Platelets 172  150 - 400 (K/uL)  WET PREP, GENITAL   Collection Time   11/24/10  10:30 PM      Component Value Range   Yeast, Wet Prep NONE SEEN  NONE SEEN    Trich, Wet Prep NONE SEEN  NONE SEEN    Clue Cells, Wet Prep FEW (*) NONE SEEN    WBC, Wet Prep HPF POC FEW (*) NONE SEEN    Imaging Studies:  Ct Abdomen Pelvis W Contrast  11/22/2010  *RADIOLOGY REPORT*   Clinical Data: Vomiting and abdominal pain  CT ABDOMEN AND PELVIS WITH CONTRAST  Technique:  Multidetector CT imaging of the abdomen and pelvis was performed following the standard protocol during bolus administration of intravenous contrast.  Contrast: 100 ml Omnipaque-300  Comparison: CT abdomen pelvis 03/13/2009  Findings: The lung bases are clear.   The liver, spleen, adrenal glands, pancreas, and kidneys appears stable within normal limits.  Abdominal aorta is normal in caliber.  Stomach is unremarkable.  Small bowel loops are normal in caliber and wall thickness.  The colon is normal in caliber wall thickness. The appendix is normal.  There is no lymphadenopathy or free air. No acute inflammatory changes are identified in the mesenteries.  Abdominal aorta is normal in caliber and the branch vessels are patent.  The uterus, adnexa, and urinary bladder are within normal limits for CT appearances.  There is a small to moderate amount of free fluid in the pelvis.  Inguinal regions are unremarkable.  Vertebral bodies are normal in height and alignment.  No fracture or suspicious bony abnormality.  IMPRESSION:  1.  Small to moderate amount of free fluid in the pelvis.  This may be physiologic for a female this age. 2.  No acute abnormality in the abdomen or pelvis.  Original Report Authenticated By: Britta Mccreedy, M.D.   ED Course: Tordol, IVF, Pepcid and Zofran given without relief of symptoms. Pain relieved by IV 2mg  Morphine/12.5 Phenergan.  Assessment: Gastric Abdominal Pain  Plan: Discharge home with GERD instructions FU with PCP or GI if symptoms persist  Johnna Bollier E. 11/24/2010,11:21 PM

## 2010-11-24 NOTE — ED Notes (Signed)
Toradol 60 mg IM given @ 2127; at 2130, pt asking for more pain medicine; asking for Morphine; crying and begging for a couple of minutes then having a temper tantrum the next minute; pt's mom is in the lobby and pt does not want her in her room;

## 2010-11-24 NOTE — ED Notes (Signed)
Pt pulling her blanket over her head and states that "yall don't understand I need strong pain medicine"; Mood changing between crying, pouting, begging, acting angry,sleepy and calm; Still does not want her Mom to bein the room  with her;

## 2010-11-24 NOTE — Progress Notes (Signed)
Pt states, " While I was eating and then I started hurting at my belly button, and I took the reglan, and the tylenol, but then starte vomiting. The pain feels like contractions and sharp. I had a CT scan at [facility] ED on Wed."

## 2010-11-25 LAB — AMYLASE: Amylase: 48 U/L (ref 0–105)

## 2010-11-25 LAB — LIPASE, BLOOD: Lipase: 14 U/L (ref 11–59)

## 2010-11-25 LAB — RAPID URINE DRUG SCREEN, HOSP PERFORMED
Barbiturates: POSITIVE — AB
Benzodiazepines: NOT DETECTED

## 2010-11-25 NOTE — ED Provider Notes (Signed)
Agree with above note.  ANYANWU,UGONNA A 11/25/2010 12:04 AM

## 2010-11-27 LAB — GC/CHLAMYDIA PROBE AMP, GENITAL
Chlamydia, DNA Probe: NEGATIVE
GC Probe Amp, Genital: NEGATIVE

## 2011-04-13 ENCOUNTER — Emergency Department (HOSPITAL_BASED_OUTPATIENT_CLINIC_OR_DEPARTMENT_OTHER)
Admission: EM | Admit: 2011-04-13 | Discharge: 2011-04-14 | Disposition: A | Discharge status: Home / Self Care | Payer: Managed Care, Other (non HMO) | Attending: Emergency Medicine | Admitting: Emergency Medicine

## 2011-04-13 ENCOUNTER — Encounter (HOSPITAL_BASED_OUTPATIENT_CLINIC_OR_DEPARTMENT_OTHER): Payer: Self-pay | Admitting: Emergency Medicine

## 2011-04-13 DIAGNOSIS — K219 Gastro-esophageal reflux disease without esophagitis: Secondary | ICD-10-CM | POA: Insufficient documentation

## 2011-04-13 DIAGNOSIS — R112 Nausea with vomiting, unspecified: Secondary | ICD-10-CM | POA: Insufficient documentation

## 2011-04-13 DIAGNOSIS — F17200 Nicotine dependence, unspecified, uncomplicated: Secondary | ICD-10-CM | POA: Insufficient documentation

## 2011-04-13 DIAGNOSIS — R109 Unspecified abdominal pain: Secondary | ICD-10-CM | POA: Insufficient documentation

## 2011-04-13 HISTORY — DX: Gastritis, unspecified, without bleeding: K29.70

## 2011-04-13 LAB — URINALYSIS, ROUTINE W REFLEX MICROSCOPIC
Hgb urine dipstick: NEGATIVE
Nitrite: NEGATIVE
Protein, ur: 100 mg/dL — AB
Specific Gravity, Urine: 1.038 — ABNORMAL HIGH (ref 1.005–1.030)
Urobilinogen, UA: 1 mg/dL (ref 0.0–1.0)

## 2011-04-13 LAB — PREGNANCY, URINE: Preg Test, Ur: NEGATIVE

## 2011-04-13 LAB — DIFFERENTIAL
Basophils Relative: 0 % (ref 0–1)
Eosinophils Relative: 0 % (ref 0–5)
Lymphocytes Relative: 10 % — ABNORMAL LOW (ref 12–46)
Monocytes Absolute: 0.3 10*3/uL (ref 0.1–1.0)
Monocytes Relative: 2 % — ABNORMAL LOW (ref 3–12)
Neutro Abs: 13.5 10*3/uL — ABNORMAL HIGH (ref 1.7–7.7)

## 2011-04-13 LAB — CBC
HCT: 41.5 % (ref 36.0–46.0)
Hemoglobin: 14.2 g/dL (ref 12.0–15.0)
MCHC: 34.2 g/dL (ref 30.0–36.0)
MCV: 85.9 fL (ref 78.0–100.0)

## 2011-04-13 LAB — BASIC METABOLIC PANEL
BUN: 11 mg/dL (ref 6–23)
Calcium: 9.8 mg/dL (ref 8.4–10.5)
Creatinine, Ser: 0.8 mg/dL (ref 0.50–1.10)
GFR calc non Af Amer: >90 mL/min (ref >90)
Glucose, Bld: 156 mg/dL — ABNORMAL HIGH (ref 70–99)

## 2011-04-13 LAB — URINE MICROSCOPIC-ADD ON

## 2011-04-13 MED ORDER — PANTOPRAZOLE SODIUM 40 MG IV SOLR
40.0000 mg | Freq: Once | INTRAVENOUS | Status: AC
  Administered 2011-04-13: 40 mg via INTRAVENOUS
  Filled 2011-04-13: qty 40

## 2011-04-13 MED ORDER — SODIUM CHLORIDE 0.9 % IV BOLUS (SEPSIS)
1000.0000 mL | Freq: Once | INTRAVENOUS | Status: AC
  Administered 2011-04-14: 1000 mL via INTRAVENOUS

## 2011-04-13 MED ORDER — FENTANYL CITRATE 0.05 MG/ML IJ SOLN
50.0000 ug | Freq: Once | INTRAMUSCULAR | Status: AC
  Administered 2011-04-13: 50 ug via INTRAVENOUS
  Filled 2011-04-13: qty 2

## 2011-04-13 MED ORDER — SUCRALFATE 1 G PO TABS
1.0000 g | ORAL_TABLET | Freq: Once | ORAL | Status: AC
  Administered 2011-04-13: 1 g via ORAL
  Filled 2011-04-13: qty 1

## 2011-04-13 MED ORDER — FENTANYL CITRATE 0.05 MG/ML IJ SOLN: 50.0000 ug | Freq: Once | INTRAMUSCULAR | Status: DC

## 2011-04-13 MED ORDER — SODIUM CHLORIDE 0.9 % IV BOLUS (SEPSIS)
1000.0000 mL | Freq: Once | INTRAVENOUS | Status: AC
  Administered 2011-04-13: 1000 mL via INTRAVENOUS

## 2011-04-13 MED ORDER — ONDANSETRON HCL 4 MG/2ML IJ SOLN
4.0000 mg | Freq: Once | INTRAMUSCULAR | Status: AC
  Administered 2011-04-13: 4 mg via INTRAVENOUS
  Filled 2011-04-13: qty 2

## 2011-04-13 NOTE — ED Notes (Signed)
Pt c/o abd pain with n/v onset this afternoon.

## 2011-04-13 NOTE — ED Provider Notes (Signed)
History     CSN: [id number]  Arrival date & time 04/13/11  2151   First MD Initiated Contact with Patient 04/13/11 2314      Chief Complaint  Patient presents with  . Abdominal Pain  . Emesis  . Nausea    (Consider location/radiation/quality/duration/timing/severity/associated sxs/prior treatment) HPI Is a 26 year old black female with a history of acid reflux. Although her records state she has a history of pancreatitis she is not sure if she has this, as she has never been specifically told that she did. She is here with epigastric pain, nausea and vomiting after eating at [employer] at about 4 this afternoon. She states the pain is severe and not especially worse with movement or palpation. She was given an IV fluid bolus and Zofran IV by nursing staff prior to evaluation. She states she is no longer vomiting but has been retching. She denies diarrhea. She states her lips are dry but her mouth is not.  Past Medical History  Diagnosis Date  . Acid reflux disease   . Pancreatitis   . Gastritis     Past Surgical History  Procedure Date  . No past surgeries     History reviewed. No pertinent family history.  History  Substance Use Topics  . Smoking status: Current Everyday Smoker -- 2 years    Types: Cigars  . Smokeless tobacco: Not on file  . Alcohol Use: No    OB History    Grav Para Term Preterm Abortions TAB SAB Ect Mult Living   0               Review of Systems  All other systems reviewed and are negative.    Allergies  Review of patient's allergies indicates no known allergies.  Home Medications   Current Outpatient Rx  Name Route Sig Dispense Refill  . ACETAMINOPHEN 500 MG PO TABS Oral Take 1,000 mg by mouth every 6 (six) hours as needed. For pain     . ASPIRIN EFFERVESCENT 325 MG PO TBEF Oral Take 650 mg by mouth every 6 (six) hours as needed. For indigestion     . IBUPROFEN-DIPHENHYDRAMINE HCL 200-25 MG PO CAPS Oral Take 2 capsules by mouth at  bedtime.      . ADULT MULTIVITAMIN W/MINERALS CH Oral Take 1 tablet by mouth daily.        BP 131/76  Pulse 62  Temp(Src) 98.2 F (36.8 C) (Oral)  Resp 18  SpO2 96%  Physical Exam General: Well-developed, well-nourished female in no acute distress; appearance consistent with age of record HENT: normocephalic, atraumatic; mucous membranes moist Eyes: pupils equal round and reactive to light; extraocular muscles intact Neck: supple Heart: regular rate and rhythm Lungs: clear to auscultation bilaterally Abdomen: soft; mild epigastric tenderness; nondistended; no masses or hepatosplenomegaly; bowel sounds present Extremities: No deformity; full range of motion Neurologic: Awake, alert and oriented; motor function intact in all extremities and symmetric; no facial droop Skin: Warm and dry     ED Course  Procedures (including critical care time)     MDM   Nursing notes and vitals signs, including pulse oximetry, reviewed.  Summary of this visit's results, reviewed by myself:  Labs:  Results for orders placed during the hospital encounter of 04/13/11  URINALYSIS, ROUTINE W REFLEX MICROSCOPIC      Component Value Range   Color, Urine YELLOW  YELLOW    APPearance TURBID (*) CLEAR    Specific Gravity, Urine 1.038 (*) 1.005 - 1.030  pH 8.5 (*) 5.0 - 8.0    Glucose, UA NEGATIVE  NEGATIVE (mg/dL)   Hgb urine dipstick NEGATIVE  NEGATIVE    Bilirubin Urine NEGATIVE  NEGATIVE    Ketones, ur >80 (*) NEGATIVE (mg/dL)   Protein, ur 161 (*) NEGATIVE (mg/dL)   Urobilinogen, UA 1.0  0.0 - 1.0 (mg/dL)   Nitrite NEGATIVE  NEGATIVE    Leukocytes, UA NEGATIVE  NEGATIVE   PREGNANCY, URINE      Component Value Range   Preg Test, Ur NEGATIVE    URINE MICROSCOPIC-ADD ON      Component Value Range   Squamous Epithelial / LPF RARE  RARE    RBC / HPF 0-2  <3 (RBC/hpf)   Bacteria, UA RARE  RARE    Urine-Other AMORPHOUS URATES/PHOSPHATES    CBC      Component Value Range   WBC 15.4  (*) 4.0 - 10.5 (K/uL)   RBC 4.83  3.87 - 5.11 (MIL/uL)   Hemoglobin 14.2  12.0 - 15.0 (g/dL)   HCT 09.6  04.5 - 40.9 (%)   MCV 85.9  78.0 - 100.0 (fL)   MCH 29.4  26.0 - 34.0 (pg)   MCHC 34.2  30.0 - 36.0 (g/dL)   RDW 81.1  91.4 - 78.2 (%)   Platelets 270  150 - 400 (K/uL)  DIFFERENTIAL      Component Value Range   Neutrophils Relative 88 (*) 43 - 77 (%)   Neutro Abs 13.5 (*) 1.7 - 7.7 (K/uL)   Lymphocytes Relative 10 (*) 12 - 46 (%)   Lymphs Abs 1.6  0.7 - 4.0 (K/uL)   Monocytes Relative 2 (*) 3 - 12 (%)   Monocytes Absolute 0.3  0.1 - 1.0 (K/uL)   Eosinophils Relative 0  0 - 5 (%)   Eosinophils Absolute 0.0  0.0 - 0.7 (K/uL)   Basophils Relative 0  0 - 1 (%)   Basophils Absolute 0.0  0.0 - 0.1 (K/uL)  BASIC METABOLIC PANEL      Component Value Range   Sodium 138  135 - 145 (mEq/L)   Potassium 3.6  3.5 - 5.1 (mEq/L)   Chloride 97  96 - 112 (mEq/L)   CO2 27  19 - 32 (mEq/L)   Glucose, Bld 156 (*) 70 - 99 (mg/dL)   BUN 11  6 - 23 (mg/dL)   Creatinine, Ser 9.56  0.50 - 1.10 (mg/dL)   Calcium 9.8  8.4 - 21.3 (mg/dL)   GFR calc non Af Amer >90  >90 (mL/min)   GFR calc Af Amer >90  >90 (mL/min)  LIPASE, BLOOD      Component Value Range   Lipase 16  11 - 59 (U/L)   1:52 AM Feels better, wants to go home.          Hanley Seamen, MD 04/14/11 [phone]

## 2011-04-14 ENCOUNTER — Emergency Department (HOSPITAL_BASED_OUTPATIENT_CLINIC_OR_DEPARTMENT_OTHER)
Admission: EM | Admit: 2011-04-14 | Discharge: 2011-04-14 | Disposition: A | Discharge status: Home / Self Care | Payer: Managed Care, Other (non HMO) | Attending: Emergency Medicine | Admitting: Emergency Medicine

## 2011-04-14 ENCOUNTER — Encounter (HOSPITAL_BASED_OUTPATIENT_CLINIC_OR_DEPARTMENT_OTHER): Payer: Self-pay | Admitting: *Deleted

## 2011-04-14 DIAGNOSIS — K219 Gastro-esophageal reflux disease without esophagitis: Secondary | ICD-10-CM | POA: Insufficient documentation

## 2011-04-14 DIAGNOSIS — R109 Unspecified abdominal pain: Secondary | ICD-10-CM | POA: Insufficient documentation

## 2011-04-14 DIAGNOSIS — R1110 Vomiting, unspecified: Secondary | ICD-10-CM | POA: Insufficient documentation

## 2011-04-14 DIAGNOSIS — F17200 Nicotine dependence, unspecified, uncomplicated: Secondary | ICD-10-CM | POA: Insufficient documentation

## 2011-04-14 MED ORDER — FENTANYL CITRATE 0.05 MG/ML IJ SOLN
INTRAMUSCULAR | Status: AC
  Administered 2011-04-14: 50 ug via INTRAVENOUS
  Filled 2011-04-14: qty 2

## 2011-04-14 MED ORDER — FENTANYL CITRATE 0.05 MG/ML IJ SOLN
50.0000 ug | Freq: Once | INTRAMUSCULAR | Status: AC
  Administered 2011-04-14: 50 ug via INTRAVENOUS

## 2011-04-14 MED ORDER — SUCRALFATE 1 G PO TABS: 1.0000 g | ORAL_TABLET | Freq: Four times a day (QID) | ORAL | Status: DC

## 2011-04-14 MED ORDER — LANSOPRAZOLE 15 MG PO CPDR: 15.0000 mg | DELAYED_RELEASE_CAPSULE | Freq: Every day | ORAL | Status: DC

## 2011-04-14 MED ORDER — ONDANSETRON 4 MG PO TBDP: 4.0000 mg | ORAL_TABLET | Freq: Once | ORAL | Status: DC

## 2011-04-14 MED ORDER — ONDANSETRON 8 MG PO TBDP: 8.0000 mg | ORAL_TABLET | Freq: Three times a day (TID) | ORAL | Status: AC | PRN

## 2011-04-14 MED ORDER — PROMETHAZINE HCL 25 MG/ML IJ SOLN
25.0000 mg | Freq: Once | INTRAMUSCULAR | Status: AC
  Administered 2011-04-14: 25 mg via INTRAMUSCULAR
  Filled 2011-04-14: qty 1

## 2011-04-14 MED ORDER — SODIUM CHLORIDE 0.9 % IV BOLUS (SEPSIS)
1000.0000 mL | Freq: Once | INTRAVENOUS | Status: AC
  Administered 2011-04-14: 1000 mL via INTRAVENOUS

## 2011-04-14 MED ORDER — HYDROCODONE-ACETAMINOPHEN 5-500 MG PO TABS: 1.0000 | ORAL_TABLET | Freq: Four times a day (QID) | ORAL | Status: AC | PRN

## 2011-04-14 MED ORDER — MORPHINE SULFATE 4 MG/ML IJ SOLN
4.0000 mg | Freq: Once | INTRAMUSCULAR | Status: AC
  Administered 2011-04-14: 4 mg via INTRAMUSCULAR
  Filled 2011-04-14: qty 1

## 2011-04-14 NOTE — ED Notes (Signed)
Pt states that she continues to have severe pain.  Provider has given VO for repeat dose of of Fentanyl

## 2011-04-14 NOTE — ED Provider Notes (Signed)
History     CSN: [id number]  Arrival date & time 04/14/11  1417   First MD Initiated Contact with Patient 04/14/11 1523      Chief Complaint  Patient presents with  . Abdominal Pain    (Consider location/radiation/quality/duration/timing/severity/associated sxs/prior treatment) HPI Comments: Pt states that she was seen here yesterday for vomiting and pain:pt states that she wasn't given anything for pain at home:pt states that she has a history of this over several years where certain food seem to trigger the symptoms:pt states that the zofran is helping at home  Patient is a 26 y.o. female presenting with abdominal pain. The history is provided by the patient. No language interpreter was used.  Abdominal Pain The primary symptoms of the illness include abdominal pain, nausea, vomiting and vaginal discharge. The primary symptoms of the illness do not include fever. The current episode started yesterday. The onset of the illness was sudden. The problem has not changed since onset. The patient states that she believes she is currently not pregnant. The patient has not had a change in bowel habit. Symptoms associated with the illness do not include frequency or back pain. Significant associated medical issues include GERD.    Past Medical History  Diagnosis Date  . Acid reflux disease   . Pancreatitis   . Gastritis     Past Surgical History  Procedure Date  . No past surgeries     History reviewed. No pertinent family history.  History  Substance Use Topics  . Smoking status: Current Everyday Smoker -- 2 years    Types: Cigars  . Smokeless tobacco: Not on file  . Alcohol Use: No    OB History    Grav Para Term Preterm Abortions TAB SAB Ect Mult Living   0               Review of Systems  Constitutional: Negative for fever.  Gastrointestinal: Positive for nausea, vomiting and abdominal pain.  Genitourinary: Positive for vaginal discharge. Negative for frequency.    Musculoskeletal: Negative for back pain.  All other systems reviewed and are negative.    Allergies  Review of patient's allergies indicates no known allergies.  Home Medications   Current Outpatient Rx  Name Route Sig Dispense Refill  . ACETAMINOPHEN 500 MG PO TABS Oral Take 1,000 mg by mouth every 6 (six) hours as needed. For pain     . ASPIRIN EFFERVESCENT 325 MG PO TBEF Oral Take 650 mg by mouth every 6 (six) hours as needed. For indigestion     . IBUPROFEN-DIPHENHYDRAMINE HCL 200-25 MG PO CAPS Oral Take 2 capsules by mouth at bedtime.      Marland Kitchen LANSOPRAZOLE 15 MG PO CPDR Oral Take 1 capsule (15 mg total) by mouth daily. 14 capsule 0  . ADULT MULTIVITAMIN W/MINERALS CH Oral Take 1 tablet by mouth daily.      Marland Kitchen ONDANSETRON 8 MG PO TBDP Oral Take 1 tablet (8 mg total) by mouth every 8 (eight) hours as needed for nausea. 10 tablet 0  . SUCRALFATE 1 G PO TABS Oral Take 1 tablet (1 g total) by mouth 4 (four) times daily. 40 tablet 0    BP 144/106  Pulse 66  Temp(Src) 98.1 F (36.7 C) (Oral)  Resp 20  Ht 5\' 3"  (1.6 m)  Wt 120 lb (54.432 kg)  BMI 21.26 kg/m2  SpO2 100%  LMP 04/08/2011  Physical Exam  Nursing note and vitals reviewed. Constitutional: She is oriented to  person, place, and time. She appears well-developed and well-nourished.  HENT:  Head: Normocephalic and atraumatic.  Neck: Normal range of motion. Neck supple.  Cardiovascular: Normal rate and regular rhythm.   Pulmonary/Chest: Effort normal and breath sounds normal.  Abdominal: Soft. Bowel sounds are normal. There is no tenderness. There is no rebound and no guarding.  Neurological: She is alert and oriented to person, place, and time.  Skin: Skin is warm and dry.  Psychiatric: She has a normal mood and affect.    ED Course  Procedures (including critical care time)  Labs Reviewed - No data to display No results found.   1. Vomiting       MDM  3:37 PM Pt had labs and work in the last 24  hours:abdomen not acute:will treat for pain and nausea and re evaluate  5:24 PM Pt tolerating:pt symptoms have resolved:will treat with something for pain and have follow up      Teressa Lower, NP 04/14/11 1724

## 2011-04-14 NOTE — ED Notes (Signed)
Pt seen here yesterday. Meds given. Helped with nausea, but not pain. Vomiting at triage. Restless.

## 2011-04-15 NOTE — ED Provider Notes (Signed)
Medical screening examination/treatment/procedure(s) were performed by non-physician practitioner and as supervising physician I was immediately available for consultation/collaboration.   Kassem Kibbe A. Patrica Duel, MD 04/15/11 [phone]

## 2012-06-26 ENCOUNTER — Encounter (HOSPITAL_BASED_OUTPATIENT_CLINIC_OR_DEPARTMENT_OTHER): Payer: Self-pay

## 2012-06-26 ENCOUNTER — Emergency Department (HOSPITAL_BASED_OUTPATIENT_CLINIC_OR_DEPARTMENT_OTHER): Payer: Managed Care, Other (non HMO)

## 2012-06-26 ENCOUNTER — Emergency Department (HOSPITAL_BASED_OUTPATIENT_CLINIC_OR_DEPARTMENT_OTHER)
Admission: EM | Admit: 2012-06-26 | Discharge: 2012-06-27 | Disposition: A | Discharge status: Home / Self Care | Payer: Managed Care, Other (non HMO) | Attending: Emergency Medicine | Admitting: Emergency Medicine

## 2012-06-26 DIAGNOSIS — K219 Gastro-esophageal reflux disease without esophagitis: Secondary | ICD-10-CM | POA: Insufficient documentation

## 2012-06-26 DIAGNOSIS — Z3202 Encounter for pregnancy test, result negative: Secondary | ICD-10-CM | POA: Insufficient documentation

## 2012-06-26 DIAGNOSIS — R112 Nausea with vomiting, unspecified: Secondary | ICD-10-CM | POA: Insufficient documentation

## 2012-06-26 DIAGNOSIS — K2900 Acute gastritis without bleeding: Secondary | ICD-10-CM | POA: Insufficient documentation

## 2012-06-26 DIAGNOSIS — F17200 Nicotine dependence, unspecified, uncomplicated: Secondary | ICD-10-CM | POA: Insufficient documentation

## 2012-06-26 DIAGNOSIS — Z8719 Personal history of other diseases of the digestive system: Secondary | ICD-10-CM | POA: Insufficient documentation

## 2012-06-26 DIAGNOSIS — Z79899 Other long term (current) drug therapy: Secondary | ICD-10-CM | POA: Insufficient documentation

## 2012-06-26 LAB — COMPREHENSIVE METABOLIC PANEL
ALT: 22 U/L (ref 0–35)
AST: 28 U/L (ref 0–37)
CO2: 26 mEq/L (ref 19–32)
Chloride: 99 mEq/L (ref 96–112)
Creatinine, Ser: 0.9 mg/dL (ref 0.50–1.10)
GFR calc non Af Amer: 89 mL/min — ABNORMAL LOW (ref >90)
Sodium: 141 mEq/L (ref 135–145)
Total Bilirubin: 1.9 mg/dL — ABNORMAL HIGH (ref 0.3–1.2)

## 2012-06-26 LAB — URINALYSIS, ROUTINE W REFLEX MICROSCOPIC
Bilirubin Urine: NEGATIVE
Glucose, UA: NEGATIVE mg/dL
Hgb urine dipstick: NEGATIVE
Ketones, ur: >80 mg/dL — AB
Protein, ur: 30 mg/dL — AB

## 2012-06-26 LAB — CBC WITH DIFFERENTIAL/PLATELET
Basophils Absolute: 0 10*3/uL (ref 0.0–0.1)
HCT: 46 % (ref 36.0–46.0)
Lymphocytes Relative: 4 % — ABNORMAL LOW (ref 12–46)
Lymphs Abs: 0.9 10*3/uL (ref 0.7–4.0)
Monocytes Absolute: 0.5 10*3/uL (ref 0.1–1.0)
Neutro Abs: 19.5 10*3/uL — ABNORMAL HIGH (ref 1.7–7.7)
RBC: 5.26 MIL/uL — ABNORMAL HIGH (ref 3.87–5.11)
RDW: 13.2 % (ref 11.5–15.5)
WBC: 21 10*3/uL — ABNORMAL HIGH (ref 4.0–10.5)

## 2012-06-26 LAB — PREGNANCY, URINE: Preg Test, Ur: NEGATIVE

## 2012-06-26 LAB — URINE MICROSCOPIC-ADD ON

## 2012-06-26 MED ORDER — GI COCKTAIL ~~LOC~~
30.0000 mL | Freq: Once | ORAL | Status: AC
  Administered 2012-06-26: 30 mL via ORAL
  Filled 2012-06-26: qty 30

## 2012-06-26 MED ORDER — ONDANSETRON HCL 4 MG/2ML IJ SOLN
4.0000 mg | Freq: Once | INTRAMUSCULAR | Status: AC
  Administered 2012-06-26: 4 mg via INTRAVENOUS
  Filled 2012-06-26: qty 2

## 2012-06-26 MED ORDER — LORAZEPAM 2 MG/ML IJ SOLN
0.5000 mg | Freq: Once | INTRAMUSCULAR | Status: AC
  Administered 2012-06-26: 0.5 mg via INTRAVENOUS
  Filled 2012-06-26: qty 1

## 2012-06-26 MED ORDER — HYDROMORPHONE HCL PF 1 MG/ML IJ SOLN: 1.0000 mg | Freq: Once | INTRAMUSCULAR | Status: DC

## 2012-06-26 MED ORDER — HYDROMORPHONE HCL PF 1 MG/ML IJ SOLN
1.0000 mg | Freq: Once | INTRAMUSCULAR | Status: AC
  Administered 2012-06-26: 1 mg via INTRAVENOUS
  Filled 2012-06-26: qty 1

## 2012-06-26 NOTE — ED Provider Notes (Signed)
History     CSN: [id number]  Arrival date & time 06/26/12  1953   First MD Initiated Contact with Patient 06/26/12 2113      Chief Complaint  Patient presents with  . Abdominal Pain    (Consider location/radiation/quality/duration/timing/severity/associated sxs/prior treatment) HPI  The patient presents with ongoing abdominal pain, nausea, vomiting.  This episode began approximately 12 hours prior to ER arrival.  This episode is similar to several prior similar events.  She has not seen a gastroenterologist since the initial events.  Since onset the pain has been severe, sharp, burning with mild radiation superiorly.  There is associated nausea, multiple episodes of emesis.  Concurrent anorexia, chills, fever/subjective. No relief with anything.  No clear exacerbating factors, though food seems to aggravate the nausea. No other chest pain, no other abdominal pain, no vaginal complaints, no urinary complaints.   Past Medical History  Diagnosis Date  . Acid reflux disease   . Pancreatitis   . Gastritis     Past Surgical History  Procedure Laterality Date  . No past surgeries      No family history on file.  History  Substance Use Topics  . Smoking status: Current Every Day Smoker -- 2 years  . Smokeless tobacco: Not on file  . Alcohol Use: No    OB History   Grav Para Term Preterm Abortions TAB SAB Ect Mult Living   0               Review of Systems  Constitutional:       Per HPI, otherwise negative  HENT:       Per HPI, otherwise negative  Respiratory:       Per HPI, otherwise negative  Cardiovascular:       Per HPI, otherwise negative  Gastrointestinal: Positive for nausea, vomiting and abdominal pain. Negative for diarrhea.  Endocrine:       Negative aside from HPI  Genitourinary:       Neg aside from HPI   Musculoskeletal:       Per HPI, otherwise negative  Skin: Negative.   Neurological: Negative for syncope.    Allergies  Review of patient's  allergies indicates no known allergies.  Home Medications   Current Outpatient Rx  Name  Route  Sig  Dispense  Refill  . acetaminophen (TYLENOL) 500 MG tablet   Oral   Take 1,000 mg by mouth every 6 (six) hours as needed. For pain          . aspirin-sod bicarb-citric acid (ALKA-SELTZER) 325 MG TBEF   Oral   Take 650 mg by mouth every 6 (six) hours as needed. For indigestion          . Ibuprofen-Diphenhydramine HCl (ADVIL PM) 200-25 MG CAPS   Oral   Take 2 capsules by mouth at bedtime.           Marland Kitchen EXPIRED: lansoprazole (PREVACID 24HR) 15 MG capsule   Oral   Take 1 capsule (15 mg total) by mouth daily.   14 capsule   0   . Multiple Vitamin (MULITIVITAMIN WITH MINERALS) TABS   Oral   Take 1 tablet by mouth daily.           Marland Kitchen EXPIRED: sucralfate (CARAFATE) 1 G tablet   Oral   Take 1 tablet (1 g total) by mouth 4 (four) times daily.   40 tablet   0     BP 120/89  Pulse 80  Temp(Src) 98.3  F (36.8 C) (Oral)  Resp 20  SpO2 100%  LMP 06/12/2012  Physical Exam  Nursing note and vitals reviewed. Constitutional: She is oriented to person, place, and time. She appears well-developed and well-nourished. No distress.  HENT:  Head: Normocephalic and atraumatic.  Eyes: Conjunctivae and EOM are normal.  Cardiovascular: Normal rate and regular rhythm.   Pulmonary/Chest: Effort normal and breath sounds normal. No stridor. No respiratory distress.  Abdominal: Soft. She exhibits no distension. There is no hepatosplenomegaly. There is tenderness in the epigastric area. There is no rigidity, no rebound, no guarding and no CVA tenderness.  Musculoskeletal: She exhibits no edema.  Neurological: She is alert and oriented to person, place, and time. No cranial nerve deficit.  Skin: Skin is warm and dry.  Psychiatric: She has a normal mood and affect.    ED Course  Procedures (including critical care time)  Labs Reviewed  URINALYSIS, ROUTINE W REFLEX MICROSCOPIC - Abnormal;  Notable for the following:    Color, Urine AMBER (*)    Specific Gravity, Urine 1.038 (*)    Ketones, ur >80 (*)    Protein, ur 30 (*)    All other components within normal limits  CBC WITH DIFFERENTIAL - Abnormal; Notable for the following:    WBC 21.0 (*)    RBC 5.26 (*)    Hemoglobin 16.0 (*)    Neutrophils Relative 93 (*)    Neutro Abs 19.5 (*)    Lymphocytes Relative 4 (*)    Monocytes Relative 2 (*)    All other components within normal limits  COMPREHENSIVE METABOLIC PANEL - Abnormal; Notable for the following:    Glucose, Bld 155 (*)    Total Protein 8.7 (*)    Total Bilirubin 1.9 (*)    GFR calc non Af Amer 89 (*)    All other components within normal limits  PREGNANCY, URINE  URINE MICROSCOPIC-ADD ON  LIPASE, BLOOD   US Abdomen Complete  06/26/2012  *RADIOLOGY REPORT*  Clinical Data:  Right upper quadrant abdominal pain.  Nausea and vomiting.  Prior history of pancreatitis and gastritis.  COMPLETE ABDOMINAL ULTRASOUND  Comparison:  CT abdomen pelvis 11/22/2010, 04/02/2009.  No prior ultrasound.  Findings:  Gallbladder:  No shadowing gallstones or echogenic sludge.  No gallbladder wall thickening or pericholecystic fluid.  Negative sonographic Murphy's sign according to the ultrasound technologist.  Common bile duct:  Normal in caliber with maximum diameter approximating 2 mm.  Liver:  Normal size and echotexture without focal parenchymal abnormality.  Patent portal vein with hepatopetal flow.  IVC:  Patent.  Pancreas:  Although the pancreas is difficult to visualize in its entirety, no focal pancreatic abnormality is identified.  Spleen:  Normal size and echotexture without focal parenchymal abnormality.  Right Kidney:  No hydronephrosis.  Well-preserved cortex.  No shadowing calculi.  Normal size and parenchymal echotexture without focal abnormalities.  Approximately 10.4 cm in length.  Left Kidney:  No hydronephrosis.  Well-preserved cortex.  No shadowing calculi.  Normal size  and parenchymal echotexture without focal abnormalities.  Approximately 9.8 cm in length.  Abdominal aorta:  Normal in caliber throughout its visualized course in the abdomen without significant atherosclerosis.  IMPRESSION: Normal examination.   Original Report Authenticated By: Hulan Saas, M.D.      No diagnosis found.  Initial labs demonstrate leukocytosis and with the patient's epigastric pain, persistent nausea vomiting, she ultrasound rule out hepatobiliary dysfunction.   11:31 PM Patient appears better.  Ultrasound results reviewed with patient  and her mother.  No evidence of acute cholecystitis or other hepatobiliary dysfunction.  MDM  This young female with recurrent abdominal pain presents with acute exacerbation.  On exam she is uncomfortable.  Initially, though she presents with fluids, analgesics, antiemetics.  Patient's ultrasound was unremarkable, and although she has a leukocytosis, his presentation is most consistent with gastroesophageal irritation, with little suspicion for atypical appendicitis, or other new acute intra-abdominal process.  The patient was discharged in stable condition after a discussion on the need for GI followup.        Gerhard Munch, MD 06/26/12 2332

## 2012-06-26 NOTE — ED Notes (Signed)
C/o abd pain since 930am-vomited after eating-denies diarrhea, vaginal d/c and urinary s/s

## 2012-06-26 NOTE — ED Notes (Signed)
Pt. Continually yelling out and dry heaving as loud as she can.  Entered room several times and Pt. Stops heaving immediately.  Encouraged Pt. That some one will be in to see her soon.

## 2012-06-27 MED ORDER — PANTOPRAZOLE SODIUM 40 MG PO TBEC
40.0000 mg | DELAYED_RELEASE_TABLET | Freq: Once | ORAL | Status: AC
  Administered 2012-06-27: 40 mg via ORAL
  Filled 2012-06-27: qty 1

## 2012-06-27 MED ORDER — PANTOPRAZOLE SODIUM 20 MG PO TBEC: 40.0000 mg | DELAYED_RELEASE_TABLET | Freq: Every day | ORAL | Status: DC

## 2012-06-27 MED ORDER — SUCRALFATE 1 G PO TABS: 1.0000 g | ORAL_TABLET | Freq: Four times a day (QID) | ORAL | Status: DC

## 2014-02-23 ENCOUNTER — Encounter (HOSPITAL_BASED_OUTPATIENT_CLINIC_OR_DEPARTMENT_OTHER): Payer: Self-pay | Admitting: *Deleted

## 2014-02-23 ENCOUNTER — Emergency Department (HOSPITAL_BASED_OUTPATIENT_CLINIC_OR_DEPARTMENT_OTHER)
Admission: EM | Admit: 2014-02-23 | Discharge: 2014-02-23 | Disposition: A | Discharge status: Home / Self Care | Payer: 59 | Attending: Emergency Medicine | Admitting: Emergency Medicine

## 2014-02-23 ENCOUNTER — Emergency Department (HOSPITAL_BASED_OUTPATIENT_CLINIC_OR_DEPARTMENT_OTHER): Payer: 59

## 2014-02-23 DIAGNOSIS — R112 Nausea with vomiting, unspecified: Secondary | ICD-10-CM

## 2014-02-23 DIAGNOSIS — Z79899 Other long term (current) drug therapy: Secondary | ICD-10-CM | POA: Diagnosis not present

## 2014-02-23 DIAGNOSIS — R1084 Generalized abdominal pain: Secondary | ICD-10-CM | POA: Diagnosis not present

## 2014-02-23 DIAGNOSIS — K219 Gastro-esophageal reflux disease without esophagitis: Secondary | ICD-10-CM | POA: Diagnosis not present

## 2014-02-23 DIAGNOSIS — Z720 Tobacco use: Secondary | ICD-10-CM | POA: Insufficient documentation

## 2014-02-23 DIAGNOSIS — Z3202 Encounter for pregnancy test, result negative: Secondary | ICD-10-CM | POA: Diagnosis not present

## 2014-02-23 DIAGNOSIS — R109 Unspecified abdominal pain: Secondary | ICD-10-CM

## 2014-02-23 HISTORY — DX: Gastro-esophageal reflux disease without esophagitis: K21.9

## 2014-02-23 LAB — URINALYSIS, ROUTINE W REFLEX MICROSCOPIC
Bilirubin Urine: NEGATIVE
GLUCOSE, UA: NEGATIVE mg/dL
HGB URINE DIPSTICK: NEGATIVE
Ketones, ur: 15 mg/dL — AB
Leukocytes, UA: NEGATIVE
Nitrite: NEGATIVE
Protein, ur: NEGATIVE mg/dL
Specific Gravity, Urine: 1.022 (ref 1.005–1.030)
Urobilinogen, UA: 0.2 mg/dL (ref 0.0–1.0)
pH: 7 (ref 5.0–8.0)

## 2014-02-23 LAB — BASIC METABOLIC PANEL
ANION GAP: 16 — AB (ref 5–15)
BUN: 12 mg/dL (ref 6–23)
CHLORIDE: 98 meq/L (ref 96–112)
CO2: 27 meq/L (ref 19–32)
Calcium: 9.7 mg/dL (ref 8.4–10.5)
Creatinine, Ser: 0.8 mg/dL (ref 0.50–1.10)
GFR calc Af Amer: >90 mL/min (ref >90)
GFR calc non Af Amer: >90 mL/min (ref >90)
Glucose, Bld: 163 mg/dL — ABNORMAL HIGH (ref 70–99)
POTASSIUM: 3.5 meq/L — AB (ref 3.7–5.3)
Sodium: 141 mEq/L (ref 137–147)

## 2014-02-23 LAB — HEPATIC FUNCTION PANEL
ALBUMIN: 4.3 g/dL (ref 3.5–5.2)
ALT: 17 U/L (ref 0–35)
AST: 25 U/L (ref 0–37)
Alkaline Phosphatase: 48 U/L (ref 39–117)
Total Bilirubin: 1 mg/dL (ref 0.3–1.2)
Total Protein: 8 g/dL (ref 6.0–8.3)

## 2014-02-23 LAB — CBC WITH DIFFERENTIAL/PLATELET
BASOS ABS: 0 10*3/uL (ref 0.0–0.1)
Basophils Relative: 0 % (ref 0–1)
Eosinophils Absolute: 0.1 10*3/uL (ref 0.0–0.7)
Eosinophils Relative: 0 % (ref 0–5)
HEMATOCRIT: 42.1 % (ref 36.0–46.0)
Hemoglobin: 13.5 g/dL (ref 12.0–15.0)
Lymphocytes Relative: 18 % (ref 12–46)
Lymphs Abs: 3.4 10*3/uL (ref 0.7–4.0)
MCH: 28.6 pg (ref 26.0–34.0)
MCHC: 32.1 g/dL (ref 30.0–36.0)
MCV: 89.2 fL (ref 78.0–100.0)
MONO ABS: 1 10*3/uL (ref 0.1–1.0)
Monocytes Relative: 5 % (ref 3–12)
NEUTROS ABS: 14.9 10*3/uL — AB (ref 1.7–7.7)
NEUTROS PCT: 77 % (ref 43–77)
Platelets: 236 10*3/uL (ref 150–400)
RBC: 4.72 MIL/uL (ref 3.87–5.11)
RDW: 13.7 % (ref 11.5–15.5)
WBC: 19.4 10*3/uL — AB (ref 4.0–10.5)

## 2014-02-23 LAB — PREGNANCY, URINE: Preg Test, Ur: NEGATIVE

## 2014-02-23 MED ORDER — ONDANSETRON HCL 4 MG/2ML IJ SOLN: 4.0000 mg | Freq: Once | INTRAMUSCULAR | Status: DC

## 2014-02-23 MED ORDER — GI COCKTAIL ~~LOC~~
30.0000 mL | Freq: Once | ORAL | Status: AC
  Administered 2014-02-23: 30 mL via ORAL
  Filled 2014-02-23: qty 30

## 2014-02-23 MED ORDER — IOHEXOL 300 MG/ML  SOLN
100.0000 mL | Freq: Once | INTRAMUSCULAR | Status: AC | PRN
  Administered 2014-02-23: 100 mL via INTRAVENOUS

## 2014-02-23 MED ORDER — HALOPERIDOL LACTATE 5 MG/ML IJ SOLN
5.0000 mg | Freq: Once | INTRAMUSCULAR | Status: AC
  Administered 2014-02-23: 5 mg via INTRAVENOUS
  Filled 2014-02-23: qty 1

## 2014-02-23 MED ORDER — ONDANSETRON HCL 4 MG/2ML IJ SOLN
4.0000 mg | Freq: Once | INTRAMUSCULAR | Status: AC
  Administered 2014-02-23: 4 mg via INTRAVENOUS
  Filled 2014-02-23: qty 2

## 2014-02-23 MED ORDER — SODIUM CHLORIDE 0.9 % IV BOLUS (SEPSIS)
1000.0000 mL | Freq: Once | INTRAVENOUS | Status: AC
  Administered 2014-02-23: 1000 mL via INTRAVENOUS

## 2014-02-23 MED ORDER — OXYCODONE-ACETAMINOPHEN 5-325 MG PO TABS
1.0000 | ORAL_TABLET | Freq: Once | ORAL | Status: AC
  Administered 2014-02-23: 1 via ORAL
  Filled 2014-02-23: qty 1

## 2014-02-23 MED ORDER — ONDANSETRON 4 MG PO TBDP: ORAL_TABLET | ORAL | Status: DC

## 2014-02-23 MED ORDER — IOHEXOL 300 MG/ML  SOLN
25.0000 mL | Freq: Once | INTRAMUSCULAR | Status: AC | PRN
  Administered 2014-02-23: 25 mL via ORAL

## 2014-02-23 NOTE — ED Notes (Addendum)
Notified by Calton Dach, RN that she spoke with pt and family members about pt exaggerated behavior-no noise from room at this time

## 2014-02-23 NOTE — ED Notes (Signed)
Reports vomiting non-stop since eating [employer] at 1600- abd pain also

## 2014-02-23 NOTE — ED Notes (Signed)
No loud noises of dry heaves or yelling in pain heard from pt's room at this time

## 2014-02-23 NOTE — ED Notes (Signed)
Pt was up to BR-CCUA obtained with 120cc-pt has restarted loud dry heaves-EDP aware

## 2014-02-23 NOTE — ED Notes (Signed)
Able to hear pt at nse desk with loud dry heaves and yelling-back in with pt-states she is having abd pain and is why she is yelling-pt given instructions for slow deep breathing and no dry heaves noted while in room with pt-advised EDP would be seeing pt asap-and any further meds would be given per EDP order-pt was up to BR but did not obtain urine

## 2014-02-23 NOTE — Discharge Instructions (Signed)
Abdominal Pain Many things can cause abdominal pain. Usually, abdominal pain is not caused by a disease and will improve without treatment. It can often be observed and treated at home. Your health care provider will do a physical exam and possibly order blood tests and X-rays to help determine the seriousness of your pain. However, in many cases, more time must pass before a clear cause of the pain can be found. Before that point, your health care provider may not know if you need more testing or further treatment. HOME CARE INSTRUCTIONS  Monitor your abdominal pain for any changes. The following actions may help to alleviate any discomfort you are experiencing:  Only take over-the-counter or prescription medicines as directed by your health care provider.  Do not take laxatives unless directed to do so by your health care provider.  Try a clear liquid diet (broth, tea, or water) as directed by your health care provider. Slowly move to a bland diet as tolerated. SEEK MEDICAL CARE IF:  You have unexplained abdominal pain.  You have abdominal pain associated with nausea or diarrhea.  You have pain when you urinate or have a bowel movement.  You experience abdominal pain that wakes you in the night.  You have abdominal pain that is worsened or improved by eating food.  You have abdominal pain that is worsened with eating fatty foods.  You have a fever. SEEK IMMEDIATE MEDICAL CARE IF:   Your pain does not go away within 2 hours.  You keep throwing up (vomiting).  Your pain is felt only in portions of the abdomen, such as the right side or the left lower portion of the abdomen.  You pass bloody or black tarry stools. MAKE SURE YOU:  Understand these instructions.   Will watch your condition.   Will get help right away if you are not doing well or get worse.  Document Released: 01/03/2005 Document Revised: 03/31/2013 Document Reviewed: 12/03/2012 [facility] Patient Information  2015 [place], [place]. This information is not intended to replace advice given to you by your health care provider. Make sure you discuss any questions you have with your health care provider.   Emergency Department Resource Guide 1) Find a Doctor and Pay Out of Pocket Although you won't have to find out who is covered by your insurance plan, it is a good idea to ask around and get recommendations. You will then need to call the office and see if the doctor you have chosen will accept you as a new patient and what types of options they offer for patients who are self-pay. Some doctors offer discounts or will set up payment plans for their patients who do not have insurance, but you will need to ask so you aren't surprised when you get to your appointment.  2) Contact Your Local Health Department Not all health departments have doctors that can see patients for sick visits, but many do, so it is worth a call to see if yours does. If you don't know where your local health department is, you can check in your phone book. The CDC also has a tool to help you locate your state's health department, and many state websites also have listings of all of their local health departments.  3) Find a [facility] If your illness is not likely to be very severe or complicated, you may want to try a walk in clinic. These are popping up all over the country in pharmacies, drugstores, and shopping centers. They're  usually staffed by nurse practitioners or physician assistants that have been trained to treat common illnesses and complaints. They're usually fairly quick and inexpensive. However, if you have serious medical issues or chronic medical problems, these are probably not your best option.  No Primary Care Doctor: - Call Health Connect at  [phone] - they can help you locate a primary care doctor that  accepts your insurance, provides certain services, etc. - Physician Referral Service- [phone]  Chronic  Pain Problems: Organization         Address  Phone   Notes  [facility]  [phone] Patients need to be referred by their primary care doctor.   Medication Assistance: Organization         Address  Phone   Notes  [facility] Medication [facility] [address] [place], [place] [address] [phone] --Must be a resident of [facility] -- Must have NO insurance coverage whatsoever (no Medicaid/ Medicare, etc.) -- The pt. MUST have a primary care doctor that directs their care regularly and follows them in the community   [facility]ist  [phone]   [employer]  [phone]    Agencies that provide inexpensive medical care: Organization         Address  Phone   Notes  [place]  [phone]   [facility]    [phone]   [facility] [place], [place] [address] [phone]   [place] [address] [place] [phone]   Planned Parenthood    [phone]   [facility]    [phone]   [place] and [place] [address], [place] Phone:  [phone], Fax:  [phone] Hours of Operation:  9 am - 6 pm, M-F.  Also accepts Medicaid/Medicare and self-pay.  [facility] for [place], [address], [place] Phone: [phone], Fax: [phone]. Hours of Operation:  8:30 am - 5:30 pm, M-F.  Also accepts Medicaid and self-pay.  [facility] [address], [place] Phone: [phone]   [place], [place], [place] [phone], Ext. [phone] Mondays & Thursdays: 7-9 AM.  First 15 patients are seen on a first come, first serve basis.    [place] Providers:  Organization         Address  Phone   Notes  [facility] [address], [place] [phone] Also  accepts self-pay patients.  [facility] [id number] [place], [place]  [phone]   [place], [address], [place] [phone]   [facility] [address] [place] [phone]   Lucianne Lei [address], [place]   [phone] Only accepts [place] Access [place] patients after they have their name applied to their card.   Self-Pay (no insurance) in [facility]:  Organization         Address  Phone   Notes  Sickle Cell Patients, [facility] Internal Medicine [place] [phone]   [facility] [place] [phone]   [facility] Urgent [place]  [place], [address], [place] [phone]   [facility]/Dr. Osei-Bonsu  [address], [place] or [address], [place] [phone] Phone number for both [place] and [place] locations is the same.  [facility] [address], [place] [phone]   [facility] [address], [place] or [address] [phone]   [facility] [address] [place] [phone], phone; [phone], fax Sees patients 1st and 3rd Saturday of every month.  Must not qualify for public or private insurance (i.e. Medicaid, Medicare, [place] Health Choice, Veterans' Benefits)  Household income should be no more than 200% of the poverty level The clinic cannot treat you if you are pregnant or think you are pregnant  Sexually transmitted diseases are not treated at the clinic.    Dental Care: Organization         Address  Phone  Notes  [facility] Department of [facility] [place] [phone] Accepts children up to age 33 who are enrolled in [place] or [place]; pregnant  women with a Medicaid card; and children who have applied for Medicaid or Eugenio Saenz Health Choice, but were declined, whose parents can pay a reduced fee at time of service.  [facility] Department of [facility]  [address], [place] [phone] Accepts children up to age 48 who are enrolled in [place] or [place]; pregnant women with a Medicaid card; and children who have applied for Medicaid or [place] Health Choice, but were declined, whose parents can pay a reduced fee at time of service.  [place] Adult Dental Access PROGRAM  [place] [phone] Patients are seen by appointment only. Walk-ins are not accepted. [place] will see patients 42 years of age and older. Monday - Tuesday (8am-5pm) Most Wednesdays (8:30-5pm) $30 per visit, cash only  [facility] Adult Dental Access PROGRAM  [address], [facility] [phone] Patients are seen by appointment only. Walk-ins are not accepted. [place] will see patients 31 years of age and older. One Wednesday Evening (Monthly: Volunteer Based).  $30 per visit, cash only  [place]  [phone] for adults; Children under age 65, call Graduate Pediatric Dentistry at [phone]. Children aged 5-14, please call [phone] to request a pediatric application.  Dental services are provided in all areas of dental care including fillings, crowns and bridges, complete and partial dentures, implants, gum treatment, root canals, and extractions. Preventive care is also provided. Treatment is provided to both adults and children. Patients are selected via a lottery and there is often a waiting list.   [facility] [address], [place]  [phone] [email or web address]   [facility] [address] [place], [place] [phone], Ext. [phone] Second and Fourth Thursday of each month, opens at 6:30 AM; Clinic ends at 9 AM.  Patients are  seen on a first-come first-served basis, and a limited number are seen during each clinic.   [facility]  [address] Hillard Danker [place], [place] [phone]   Eligibility Requirements You must have lived in [place], [place], or [place] counties for at least the last three months.   You cannot be eligible for state or federal sponsored [employer], including [employer], [place], or [employer].   You generally cannot be eligible for healthcare insurance through  your employer.    How to apply: Eligibility screenings are held every Tuesday and Wednesday afternoon from 1:00 pm until 4:00 pm. You do not need an appointment for the interview!  [facility] [address] [place], [place]   [place]  [place] Department  [place]  [phone]    Behavioral Health Resources in the Community: Intensive Outpatient Programs Organization         Address  Phone  Notes  [place]. [address] [place], [place] [phone]   [facility] Outpatient [address] [place], [place]   ADS: Alcohol & Drug Svcs [address], [place], [place]   [place] [address],  [place], [place] or [phone]   Substance Abuse Resources Organization         Address  Phone  Notes  Alcohol and Drug Services  [phone]   [place]  [phone]   [place]   Chinita Pester  [phone]   Residential & Outpatient Substance Abuse Program  [phone]   Psychological Services Organization         Address  Phone  Notes  [facility] [place]  [place]  [phone]   [place] [address], [place] or [phone]    Mobile Crisis  Teams Organization         Address  Phone  Notes  [facility], Mobile Crisis Care Unit  [phone]   Assertive Psychotherapeutic Services  [address] [place], [place]   [place] [address], [place] [phone]    Self-Help/Support Groups Organization         Address  Phone             Notes  [place]. of [place] - variety of support groups  [place] Call for more information  Narcotics Anonymous (NA), [facility] [address], [employer] [place]  2 meetings at this location   Special educational needs teacher         Address  Phone  Notes  ASAP Residential Treatment [place],    [place]  [phone]   [facility]  [address], [place] [phone], [place], [place]   [place], [place] [phone] Admissions: 8am-3pm M-F  Incentives Substance [place] [address],    [place], [place] [phone]   [facility] [address] [place], [place], [place]   [facility] [address],  [place], [place]   [facility] - Intensive Outpatient [address], [place], [place]   [facility].,  [place], [place] [phone] or [phone]   [facility] Services (RTS) [address] [place], [place] Accepts Medicaid  [facility] [place] [address],  [place] [phone] Substance Abuse/Addiction Treatment   [facility] Organization         Address  Phone  Notes  [facility]  [phone]   Domenic Schwab, PhD [address] [place], [place]   [phone] or [phone]   [place], [place] [phone]  [facility] [address], [place], [place] [phone]  Insurance/Medicaid/sponsorship through [facility]  °[facility] [address]                                    [place], [place] [phone] Therapy/tele-psych/case  °[facility] [address]  ° [place], [place] [phone]    °Dr. Arfeen  [phone]   °[facility] [place]  [employer] [place] [facility] 1) [address], [place] °2) [address], [place] °3)  [address], [place] [phone] °([phone]) [phone] ° °([phone]) [phone]   °[place] Child Abuse Hotline [phone]) [phone] or ([phone] (After Hours)    ° ° ° ° °

## 2014-02-23 NOTE — ED Provider Notes (Signed)
CSN: [id number]     Arrival date & time 02/23/14  1836 History   This chart was scribed for Samantha Freiberg, MD by Randa Evens, ED Scribe. This patient was seen in room MH08/MH08 and the patient's care was started at 7:18 PM.    Chief Complaint  Patient presents with  . Emesis   Patient is a 28 y.o. female presenting with vomiting. The history is provided by the patient. No language interpreter was used.  Emesis Severity:  Moderate Duration:  3 hours Timing:  Intermittent Progression:  Unchanged Chronicity:  New Relieved by:  Nothing Worsened by:  Nothing tried Ineffective treatments:  None tried Associated symptoms: abdominal pain   Associated symptoms: no diarrhea and no fever    HPI Comments: Samantha Noble is a 28 y.o. female who presents to the Emergency Department complaining of vomiting onset 4 PM today. She states she has associated nausea and abdominal pain. Pt states that she started some new medication for anxiety and depression today and hour after taking the medication and eating [employer] she started vomiting. She denies diarrhea, constipation, fever, dysuria, vaginal bleeding or vaginal discharge. Pt states she has a Hx of GERD and her symptoms feel similar today.   Past Medical History  Diagnosis Date  . Acid reflux disease   . Pancreatitis   . Gastritis   . GERD (gastroesophageal reflux disease)    Past Surgical History  Procedure Laterality Date  . No past surgeries     No family history on file. History  Substance Use Topics  . Smoking status: Current Every Day Smoker -- 2 years    Types: Cigarettes  . Smokeless tobacco: Not on file  . Alcohol Use: Yes     Comment: last drink yesterday   OB History    Gravida Para Term Preterm AB TAB SAB Ectopic Multiple Living   0              Review of Systems  Constitutional: Negative for fever.  Gastrointestinal: Positive for nausea, vomiting and abdominal pain. Negative for diarrhea, constipation and  blood in stool.  Genitourinary: Negative for dysuria, vaginal bleeding and vaginal discharge.  All other systems reviewed and are negative.     Allergies  Review of patient's allergies indicates no known allergies.  Home Medications   Prior to Admission medications   Medication Sig Start Date End Date Taking? Authorizing Provider  ARIPiprazole (ABILIFY) 5 MG tablet Take 5 mg by mouth daily.   Yes Historical Provider, MD  escitalopram (LEXAPRO) 5 MG tablet Take 5 mg by mouth daily.   Yes Historical Provider, MD  acetaminophen (TYLENOL) 500 MG tablet Take 1,000 mg by mouth every 6 (six) hours as needed. For pain     Historical Provider, MD  Multiple Vitamin (MULITIVITAMIN WITH MINERALS) TABS Take 1 tablet by mouth daily.      Historical Provider, MD  ondansetron (ZOFRAN ODT) 4 MG disintegrating tablet 4mg  ODT q4 hours prn nausea/vomit 02/23/14   Samantha Freiberg, MD  pantoprazole (PROTONIX) 20 MG tablet Take 2 tablets (40 mg total) by mouth daily. 06/28/12   Carmin Muskrat, MD  sucralfate (CARAFATE) 1 G tablet Take 1 tablet (1 g total) by mouth 4 (four) times daily. 06/27/12 06/27/13  Carmin Muskrat, MD   Triage Vitals: BP 107/86 mmHg  Pulse 67  Temp(Src) 98.1 F (36.7 C) (Oral)  Resp 20  Ht 5\' 4"  (1.626 m)  Wt 147 lb (66.679 kg)  BMI 25.22 kg/m2  SpO2  100%  LMP 02/09/2014  Physical Exam  Constitutional: She is oriented to person, place, and time. She appears well-developed and well-nourished. No distress.  HENT:  Head: Normocephalic and atraumatic.  Right Ear: External ear normal.  Left Ear: External ear normal.  Eyes: Conjunctivae and EOM are normal. Pupils are equal, round, and reactive to light.  Neck: Normal range of motion. Neck supple. No tracheal deviation present.  Cardiovascular: Normal rate, regular rhythm, normal heart sounds and intact distal pulses.   Pulmonary/Chest: Effort normal and breath sounds normal. No respiratory distress.  Abdominal: Soft. Bowel sounds  are normal. There is generalized tenderness.  Musculoskeletal: Normal range of motion.  Neurological: She is alert and oriented to person, place, and time.  Skin: Skin is warm and dry.  Psychiatric: She has a normal mood and affect. Her behavior is normal.  Nursing note and vitals reviewed.   ED Course  Procedures (including critical care time) DIAGNOSTIC STUDIES: Oxygen Saturation is 100% on RA, normal by my interpretation.    COORDINATION OF CARE: 7:23 PM-Discussed treatment plan which includes pain medication and GI cocktail with pt at bedside and pt agreed to plan.     Labs Review Labs Reviewed  URINALYSIS, ROUTINE W REFLEX MICROSCOPIC - Abnormal; Notable for the following:    Ketones, ur 15 (*)    All other components within normal limits  CBC WITH DIFFERENTIAL - Abnormal; Notable for the following:    WBC 19.4 (*)    Neutro Abs 14.9 (*)    All other components within normal limits  BASIC METABOLIC PANEL - Abnormal; Notable for the following:    Potassium 3.5 (*)    Glucose, Bld 163 (*)    Anion gap 16 (*)    All other components within normal limits  PREGNANCY, URINE  HEPATIC FUNCTION PANEL    Imaging Review Ct Abdomen Pelvis W Contrast  02/23/2014   CLINICAL DATA:  Nausea, vomiting and abdominal pain  EXAM: CT ABDOMEN AND PELVIS WITH CONTRAST  TECHNIQUE: Multidetector CT imaging of the abdomen and pelvis was performed using the standard protocol following bolus administration of intravenous contrast.  CONTRAST:  37mL OMNIPAQUE IOHEXOL 300 MG/ML SOLN, 184mL OMNIPAQUE IOHEXOL 300 MG/ML SOLN  COMPARISON:  06/26/2013  FINDINGS: Lung bases are free of acute infiltrate or sizable effusion.  The liver, gallbladder, spleen, adrenal glands and pancreas are all normal in their CT appearance. The kidneys are well visualized bilaterally without renal calculi or urinary tract obstructive changes.  The bladder is partially distended. The pelvis shows evidence of ovarian cystic change  bilaterally. The appendix is not well visualized. No definitive inflammatory changes to suggest appendicitis are noted. The osseous structures are grossly unremarkable.  IMPRESSION: The appendix is not well visualized. No significant inflammatory changes are seen.  Ovarian cystic change stable from the prior exam.  No acute abnormality is noted.   Electronically Signed   By: Inez Catalina M.D.   On: 02/23/2014 22:55     EKG Interpretation None      MDM   Final diagnoses:  Abdominal pain  Non-intractable vomiting with nausea, vomiting of unspecified type   28 y.o. female with pertinent PMH of GERD, chronic abd pain presents with recurrent abdominal pain after eating [employer] approximately 2 hours prior to arrival.  No recent fevers or symptoms yesterday.  Patient endorses frequent vomiting, no diarrhea. No fevers. No chest pain or shortness of breath, no respiratory symptoms.  On arrival today vitals signs and physical exam as  above. Although patient loudly retches, this does not occur while providers are in the room.  Symptoms resolved with Haldol. Patient able to tolerate by mouth intake. CT scan unremarkable. Lab work and urine similarly unremarkable with the exception of nonspecific leukocytosis. Discharged home in stable condition with strict return precautions.    1. Non-intractable vomiting with nausea, vomiting of unspecified type   2. Abdominal pain   3. Generalized abdominal pain               Samantha Freiberg, MD 02/24/14 [phone]

## 2014-02-23 NOTE — ED Notes (Signed)
Pt was given oral contrast to start by Xray tech-adult female at [facility]

## 2014-10-15 ENCOUNTER — Encounter (HOSPITAL_BASED_OUTPATIENT_CLINIC_OR_DEPARTMENT_OTHER): Payer: Self-pay | Admitting: *Deleted

## 2014-10-15 ENCOUNTER — Emergency Department (HOSPITAL_BASED_OUTPATIENT_CLINIC_OR_DEPARTMENT_OTHER)
Admission: EM | Admit: 2014-10-15 | Discharge: 2014-10-15 | Disposition: A | Discharge status: Home / Self Care | Payer: Commercial Managed Care - HMO | Attending: Emergency Medicine | Admitting: Emergency Medicine

## 2014-10-15 DIAGNOSIS — R1110 Vomiting, unspecified: Secondary | ICD-10-CM | POA: Diagnosis not present

## 2014-10-15 DIAGNOSIS — Z720 Tobacco use: Secondary | ICD-10-CM | POA: Insufficient documentation

## 2014-10-15 DIAGNOSIS — R1013 Epigastric pain: Secondary | ICD-10-CM | POA: Insufficient documentation

## 2014-10-15 DIAGNOSIS — Z79899 Other long term (current) drug therapy: Secondary | ICD-10-CM | POA: Insufficient documentation

## 2014-10-15 DIAGNOSIS — K219 Gastro-esophageal reflux disease without esophagitis: Secondary | ICD-10-CM | POA: Diagnosis not present

## 2014-10-15 DIAGNOSIS — Z3202 Encounter for pregnancy test, result negative: Secondary | ICD-10-CM | POA: Diagnosis not present

## 2014-10-15 DIAGNOSIS — R61 Generalized hyperhidrosis: Secondary | ICD-10-CM | POA: Diagnosis not present

## 2014-10-15 LAB — URINALYSIS, ROUTINE W REFLEX MICROSCOPIC
Bilirubin Urine: NEGATIVE
Glucose, UA: NEGATIVE mg/dL
Hgb urine dipstick: NEGATIVE
KETONES UR: NEGATIVE mg/dL
LEUKOCYTES UA: NEGATIVE
NITRITE: NEGATIVE
PROTEIN: NEGATIVE mg/dL
Specific Gravity, Urine: 1.04 — ABNORMAL HIGH (ref 1.005–1.030)
UROBILINOGEN UA: 0.2 mg/dL (ref 0.0–1.0)
pH: 6.5 (ref 5.0–8.0)

## 2014-10-15 LAB — COMPREHENSIVE METABOLIC PANEL
ALT: 17 U/L (ref 14–54)
AST: 24 U/L (ref 15–41)
Albumin: 4.7 g/dL (ref 3.5–5.0)
Alkaline Phosphatase: 48 U/L (ref 38–126)
Anion gap: 11 (ref 5–15)
BILIRUBIN TOTAL: 1 mg/dL (ref 0.3–1.2)
BUN: 14 mg/dL (ref 6–20)
CALCIUM: 9.4 mg/dL (ref 8.9–10.3)
CO2: 29 mmol/L (ref 22–32)
Chloride: 99 mmol/L — ABNORMAL LOW (ref 101–111)
Creatinine, Ser: 0.79 mg/dL (ref 0.44–1.00)
GFR calc Af Amer: >60 mL/min (ref >60)
Glucose, Bld: 145 mg/dL — ABNORMAL HIGH (ref 65–99)
Potassium: 3.8 mmol/L (ref 3.5–5.1)
SODIUM: 139 mmol/L (ref 135–145)
Total Protein: 8.3 g/dL — ABNORMAL HIGH (ref 6.5–8.1)

## 2014-10-15 LAB — PREGNANCY, URINE: Preg Test, Ur: NEGATIVE

## 2014-10-15 LAB — CBC
HCT: 41.3 % (ref 36.0–46.0)
Hemoglobin: 13.6 g/dL (ref 12.0–15.0)
MCH: 28.6 pg (ref 26.0–34.0)
MCHC: 32.9 g/dL (ref 30.0–36.0)
MCV: 86.8 fL (ref 78.0–100.0)
PLATELETS: 235 10*3/uL (ref 150–400)
RBC: 4.76 MIL/uL (ref 3.87–5.11)
RDW: 13.3 % (ref 11.5–15.5)
WBC: 15.5 10*3/uL — ABNORMAL HIGH (ref 4.0–10.5)

## 2014-10-15 LAB — LIPASE, BLOOD: LIPASE: 22 U/L (ref 22–51)

## 2014-10-15 MED ORDER — PROMETHAZINE HCL 25 MG RE SUPP: 25.0000 mg | Freq: Four times a day (QID) | RECTAL | Status: DC | PRN

## 2014-10-15 MED ORDER — OXYCODONE-ACETAMINOPHEN 5-325 MG PO TABS: 1.0000 | ORAL_TABLET | Freq: Four times a day (QID) | ORAL | Status: DC | PRN

## 2014-10-15 MED ORDER — HYDROMORPHONE HCL 1 MG/ML IJ SOLN
1.0000 mg | Freq: Once | INTRAMUSCULAR | Status: AC
Start: 2014-10-15 — End: 2014-10-15
  Administered 2014-10-15: 1 mg via INTRAVENOUS
  Filled 2014-10-15: qty 1

## 2014-10-15 MED ORDER — LORAZEPAM 2 MG/ML IJ SOLN
1.0000 mg | Freq: Once | INTRAMUSCULAR | Status: AC
  Administered 2014-10-15: 1 mg via INTRAVENOUS
  Filled 2014-10-15: qty 1

## 2014-10-15 MED ORDER — SODIUM CHLORIDE 0.9 % IV BOLUS (SEPSIS)
1000.0000 mL | Freq: Once | INTRAVENOUS | Status: AC
  Administered 2014-10-15: 1000 mL via INTRAVENOUS

## 2014-10-15 MED ORDER — ONDANSETRON HCL 4 MG/2ML IJ SOLN
4.0000 mg | Freq: Once | INTRAMUSCULAR | Status: AC
  Administered 2014-10-15: 4 mg via INTRAVENOUS
  Filled 2014-10-15: qty 2

## 2014-10-15 MED ORDER — PROMETHAZINE HCL 25 MG/ML IJ SOLN
25.0000 mg | Freq: Once | INTRAMUSCULAR | Status: AC
  Administered 2014-10-15: 25 mg via INTRAVENOUS
  Filled 2014-10-15: qty 1

## 2014-10-15 NOTE — ED Provider Notes (Signed)
CSN: [id number]     Arrival date & time 10/15/14  1410 History   First MD Initiated Contact with Patient 10/15/14 1457     Chief Complaint  Patient presents with  . Abdominal Pain     (Consider location/radiation/quality/duration/timing/severity/associated sxs/prior Treatment) Patient is a 29 y.o. female presenting with abdominal pain. The history is provided by the patient.  Abdominal Pain Pain location:  Epigastric Pain quality: cramping   Pain radiates to:  Does not radiate Pain severity:  Severe Onset quality:  Sudden Duration:  1 hour Timing:  Constant Progression:  Unchanged Chronicity:  Chronic Context: not alcohol use, not previous surgeries and not sick contacts   Relieved by:  Nothing Worsened by:  Nothing tried Associated symptoms: vomiting   Associated symptoms: no chest pain, no cough, no diarrhea, no fever and no shortness of breath     Past Medical History  Diagnosis Date  . Acid reflux disease   . Pancreatitis   . Gastritis   . GERD (gastroesophageal reflux disease)    Past Surgical History  Procedure Laterality Date  . No past surgeries     History reviewed. No pertinent family history. History  Substance Use Topics  . Smoking status: Current Every Day Smoker -- 0.50 packs/day for 2 years    Types: Cigarettes  . Smokeless tobacco: Not on file  . Alcohol Use: Yes     Comment: last drink yesterday   OB History    Gravida Para Term Preterm AB TAB SAB Ectopic Multiple Living   0              Review of Systems  Constitutional: Positive for diaphoresis. Negative for fever.  Respiratory: Negative for cough and shortness of breath.   Cardiovascular: Negative for chest pain and leg swelling.  Gastrointestinal: Positive for vomiting and abdominal pain. Negative for diarrhea.  All other systems reviewed and are negative.     Allergies  Review of patient's allergies indicates no known allergies.  Home Medications   Prior to Admission medications    Medication Sig Start Date End Date Taking? Authorizing Provider  acetaminophen (TYLENOL) 500 MG tablet Take 1,000 mg by mouth every 6 (six) hours as needed. For pain     Historical Provider, MD  Multiple Vitamin (MULITIVITAMIN WITH MINERALS) TABS Take 1 tablet by mouth daily.      Historical Provider, MD  ondansetron (ZOFRAN ODT) 4 MG disintegrating tablet 4mg  ODT q4 hours prn nausea/vomit 02/23/14   [name], MD  sucralfate (CARAFATE) 1 G tablet Take 1 tablet (1 g total) by mouth 4 (four) times daily. 06/27/12 06/27/13  Carmin Muskrat, MD   BP 152/97 mmHg  Pulse 66  Temp(Src) 98.2 F (36.8 C) (Oral)  Resp 16  Ht 5\' 4"  (1.626 m)  Wt 156 lb (70.761 kg)  BMI 26.76 kg/m2  SpO2 100%  LMP 10/14/2014 Physical Exam  Constitutional: She is oriented to person, place, and time. She appears well-developed and well-nourished. No distress.  HENT:  Head: Normocephalic and atraumatic.  Mouth/Throat: Oropharynx is clear and moist.  Eyes: EOM are normal. Pupils are equal, round, and reactive to light.  Neck: Normal range of motion. Neck supple.  Cardiovascular: Normal rate and regular rhythm.  Exam reveals no friction rub.   No murmur heard. Pulmonary/Chest: Effort normal and breath sounds normal. No respiratory distress. She has no wheezes. She has no rales.  Abdominal: Soft. She exhibits no distension and no mass. There is tenderness (mild epigastric pain). There  is no rebound and no guarding.  Musculoskeletal: Normal range of motion. She exhibits no edema.  Neurological: She is alert and oriented to person, place, and time.  Skin: No rash noted. She is not diaphoretic.  Nursing note and vitals reviewed.   ED Course  Procedures (including critical care time) Labs Review Labs Reviewed  CBC - Abnormal; Notable for the following:    WBC 15.5 (*)    All other components within normal limits  COMPREHENSIVE METABOLIC PANEL - Abnormal; Notable for the following:    Chloride 99 (*)     Glucose, Bld 145 (*)    Total Protein 8.3 (*)    All other components within normal limits  URINALYSIS, ROUTINE W REFLEX MICROSCOPIC (NOT AT [facility]) - Abnormal; Notable for the following:    Specific Gravity, Urine 1.040 (*)    All other components within normal limits  LIPASE, BLOOD  PREGNANCY, URINE    Imaging Review No results found.   EKG Interpretation None      MDM   Final diagnoses:  Epigastric pain    22F with hx of pancreatitis, GERD presents with epigastric pain. Feels like intense epigastric cramping. Hx of multiple ED visits and CTs for this before. Actively vomiting here. Very mild epigastric pain. No rebound or guarding. No other focal tenderness. Will hydrate, check labs, give fluids and pain meds. Abdominal exam is not concerning, no need for CT scan today. Labs ok. Feeling better after pain meds and anti-emetics. Given phenergan suppositories to go home. Given small amount of pain medicine. Given GI referral.    Evelina Bucy, MD 10/15/14 1754

## 2014-10-15 NOTE — ED Notes (Signed)
Patient vomited in the bed, bed linen changed and bed wiped down, and patient given a clean blanket given

## 2014-10-15 NOTE — ED Notes (Signed)
MD at bedside. 

## 2014-10-15 NOTE — Discharge Instructions (Signed)

## 2014-10-15 NOTE — ED Notes (Signed)
Pt c/o diffuse abd pain and vomiting x 1 hr.  HX of same

## 2015-04-05 DIAGNOSIS — F410 Panic disorder [episodic paroxysmal anxiety] without agoraphobia: Secondary | ICD-10-CM | POA: Insufficient documentation

## 2015-04-05 DIAGNOSIS — F901 Attention-deficit hyperactivity disorder, predominantly hyperactive type: Secondary | ICD-10-CM | POA: Insufficient documentation

## 2015-04-05 DIAGNOSIS — F430 Acute stress reaction: Secondary | ICD-10-CM | POA: Insufficient documentation

## 2015-05-15 DIAGNOSIS — F419 Anxiety disorder, unspecified: Secondary | ICD-10-CM | POA: Insufficient documentation

## 2015-05-15 DIAGNOSIS — R1084 Generalized abdominal pain: Secondary | ICD-10-CM

## 2015-05-15 DIAGNOSIS — L709 Acne, unspecified: Secondary | ICD-10-CM | POA: Insufficient documentation

## 2015-08-16 DIAGNOSIS — F32A Depression, unspecified: Secondary | ICD-10-CM | POA: Insufficient documentation

## 2015-12-28 ENCOUNTER — Emergency Department (HOSPITAL_BASED_OUTPATIENT_CLINIC_OR_DEPARTMENT_OTHER)
Admission: EM | Admit: 2015-12-28 | Discharge: 2015-12-28 | Disposition: A | Discharge status: Home / Self Care | Payer: Commercial Managed Care - HMO | Attending: Emergency Medicine | Admitting: Emergency Medicine

## 2015-12-28 ENCOUNTER — Encounter (HOSPITAL_BASED_OUTPATIENT_CLINIC_OR_DEPARTMENT_OTHER): Payer: Self-pay | Admitting: Emergency Medicine

## 2015-12-28 DIAGNOSIS — R109 Unspecified abdominal pain: Secondary | ICD-10-CM | POA: Diagnosis present

## 2015-12-28 DIAGNOSIS — Z79899 Other long term (current) drug therapy: Secondary | ICD-10-CM | POA: Insufficient documentation

## 2015-12-28 DIAGNOSIS — R197 Diarrhea, unspecified: Secondary | ICD-10-CM | POA: Insufficient documentation

## 2015-12-28 DIAGNOSIS — F17210 Nicotine dependence, cigarettes, uncomplicated: Secondary | ICD-10-CM | POA: Diagnosis not present

## 2015-12-28 DIAGNOSIS — R112 Nausea with vomiting, unspecified: Secondary | ICD-10-CM | POA: Diagnosis not present

## 2015-12-28 HISTORY — DX: Irritable bowel syndrome, unspecified: K58.9

## 2015-12-28 LAB — URINALYSIS, ROUTINE W REFLEX MICROSCOPIC
Bilirubin Urine: NEGATIVE
Glucose, UA: NEGATIVE mg/dL
Hgb urine dipstick: NEGATIVE
KETONES UR: NEGATIVE mg/dL
LEUKOCYTES UA: NEGATIVE
NITRITE: NEGATIVE
PROTEIN: NEGATIVE mg/dL
Specific Gravity, Urine: 1.02 (ref 1.005–1.030)
pH: 7.5 (ref 5.0–8.0)

## 2015-12-28 LAB — COMPREHENSIVE METABOLIC PANEL
ALT: 13 U/L — ABNORMAL LOW (ref 14–54)
AST: 18 U/L (ref 15–41)
Albumin: 3.7 g/dL (ref 3.5–5.0)
Alkaline Phosphatase: 41 U/L (ref 38–126)
Anion gap: 5 (ref 5–15)
BUN: 13 mg/dL (ref 6–20)
CO2: 28 mmol/L (ref 22–32)
CREATININE: 0.77 mg/dL (ref 0.44–1.00)
Calcium: 8.7 mg/dL — ABNORMAL LOW (ref 8.9–10.3)
Chloride: 106 mmol/L (ref 101–111)
GFR calc Af Amer: >60 mL/min (ref >60)
Glucose, Bld: 88 mg/dL (ref 65–99)
Potassium: 4 mmol/L (ref 3.5–5.1)
Sodium: 139 mmol/L (ref 135–145)
Total Bilirubin: 0.8 mg/dL (ref 0.3–1.2)
Total Protein: 6.5 g/dL (ref 6.5–8.1)

## 2015-12-28 LAB — CBC
HEMATOCRIT: 37 % (ref 36.0–46.0)
Hemoglobin: 12.2 g/dL (ref 12.0–15.0)
MCH: 29.5 pg (ref 26.0–34.0)
MCHC: 33 g/dL (ref 30.0–36.0)
MCV: 89.4 fL (ref 78.0–100.0)
PLATELETS: 206 10*3/uL (ref 150–400)
RBC: 4.14 MIL/uL (ref 3.87–5.11)
RDW: 14 % (ref 11.5–15.5)
WBC: 9.8 10*3/uL (ref 4.0–10.5)

## 2015-12-28 LAB — LIPASE, BLOOD: Lipase: 30 U/L (ref 11–51)

## 2015-12-28 LAB — PREGNANCY, URINE: PREG TEST UR: NEGATIVE

## 2015-12-28 MED ORDER — ONDANSETRON 4 MG PO TBDP
4.0000 mg | ORAL_TABLET | Freq: Once | ORAL | Status: AC
  Administered 2015-12-28: 4 mg via ORAL
  Filled 2015-12-28: qty 1

## 2015-12-28 MED ORDER — ONDANSETRON HCL 4 MG PO TABS: 4.0000 mg | ORAL_TABLET | Freq: Four times a day (QID) | ORAL | 0 refills | Status: DC

## 2015-12-28 NOTE — ED Triage Notes (Signed)
Pt reports abdominal pain since eating sushi on Sunday. States "I have IBS and have taken bentyl and nothing is making it better."

## 2015-12-28 NOTE — ED Provider Notes (Signed)
Emergency Department Provider Note   I have reviewed the triage vital signs and the nursing notes.   HISTORY  Chief Complaint Abdominal Pain and Irritable Bowel Syndrome   HPI Samantha Noble is a 30 y.o. female with PMH of GERD, IBS, and pancreatitis presents to the emergency department for evaluation of 3 days of severe nausea with mild abdominal cramping. Symptoms began in the setting of eating sushi. Patient had initial nonbloody emesis and 1-2 episodes of diarrhea. Since that time patient has had persistent nausea and intermittent cramping central abdominal discomfort. No radiation of pain. No vaginal bleeding or discharge. No back pain, dysuria, or fever. Vomiting and diarrhea have resolved. No chest pain or difficulty breathing. Patient has taken Bentyl at home with mild relief in pain but continued nausea.    Past Medical History:  Diagnosis Date  . Acid reflux disease   . Gastritis   . GERD (gastroesophageal reflux disease)   . IBS (irritable bowel syndrome)   . Pancreatitis     There are no active problems to display for this patient.   Past Surgical History:  Procedure Laterality Date  . NO PAST SURGERIES      Current Outpatient Rx  . Order #: [id number] Class: Historical Med  . Order #: [id number] Class: Historical Med  . Order #: [id number] Class: Historical Med  . Order #: [id number] Class: Historical Med  . Order #: [id number] Class: Print  . Order #: [id number] Class: Print  . Order #: [id number] Class: Print  . Order #: [id number] Class: Print  . Order #: [id number] Class: Print    Allergies Review of patient's allergies indicates no known allergies.  No family history on file.  Social History Social History  Substance Use Topics  . Smoking status: Current Every Day Smoker    Packs/day: 0.10    Years: 2.00    Types: Cigarettes  . Smokeless tobacco: Never Used  . Alcohol use Yes     Comment: last drink SAT    Review of Systems  Constitutional: No  fever/chills Eyes: No visual changes. ENT: No sore throat. Cardiovascular: Denies chest pain. Respiratory: Denies shortness of breath. Gastrointestinal: Positive cramping abdominal pain. Positive nausea and vomiting. Positive diarrhea.  No constipation. Genitourinary: Negative for dysuria. Musculoskeletal: Negative for back pain. Skin: Negative for rash. Neurological: Negative for headaches, focal weakness or numbness.  10-point ROS otherwise negative.  ____________________________________________   PHYSICAL EXAM:  VITAL SIGNS: ED Triage Vitals  Enc Vitals Group     BP 12/28/15 1710 110/90     Pulse Rate 12/28/15 1710 74     Resp 12/28/15 1710 16     Temp 12/28/15 1710 98.4 F (36.9 C)     Temp Source 12/28/15 1710 Oral     SpO2 12/28/15 1710 100 %     Weight 12/28/15 1710 130 lb (59 kg)     Height 12/28/15 1710 5\' 4"  (1.626 m)     Pain Score 12/28/15 1703 7   Constitutional: Alert and oriented. Well appearing and in no acute distress. Eyes: Conjunctivae are normal.  Head: Atraumatic. Mouth/Throat: Mucous membranes are moist.  Oropharynx non-erythematous. Neck: No stridor.   Cardiovascular: Normal rate, regular rhythm. Good peripheral circulation. Grossly normal heart sounds.   Respiratory: Normal respiratory effort.  No retractions. Lungs CTAB. Gastrointestinal: Soft and nontender. No distention.  Musculoskeletal: No lower extremity tenderness nor edema. No gross deformities of extremities. Neurologic:  Normal speech and language. No gross focal neurologic deficits are appreciated.  Skin:  Skin is warm, dry  and intact. No rash noted. Psychiatric: Mood and affect are normal. Speech and behavior are normal.  ____________________________________________   LABS (all labs ordered are listed, but only abnormal results are displayed)  Labs Reviewed  COMPREHENSIVE METABOLIC PANEL - Abnormal; Notable for the following:       Result Value   Calcium 8.7 (*)    ALT 13 (*)      All other components within normal limits  LIPASE, BLOOD  CBC  URINALYSIS, ROUTINE W REFLEX MICROSCOPIC (NOT AT [facility])  PREGNANCY, URINE   ____________________________________________   PROCEDURES  Procedure(s) performed:   Procedures  None ____________________________________________   INITIAL IMPRESSION / ASSESSMENT AND PLAN / ED COURSE  Pertinent labs & imaging results that were available during my care of the patient were reviewed by me and considered in my medical decision making (see chart for details).  Patient presents to the emergency department for evaluation of persistent nausea and cramping central abdominal discomfort. Her abdominal exam is benign with no appreciable tenderness to palpation, rebound, or guarding. No fever here. Plan for labs, urinalysis, pregnancy test, treatment with nausea medication. Low suspicion for surgical process in the abdomen.   Differential diagnosis includes but is not exclusive to ectopic pregnancy, ovarian cyst, ovarian torsion, acute appendicitis, urinary tract infection, endometriosis, bowel obstruction, hernia, colitis, renal colic, gastroenteritis, volvulus etc.  06:45 PM Nausea significantly improved with Zofran. Tolerating PO. Non-concerning abdominal pain. No indication for further imaging. Plan for discharge with work note, Zofran, and return precautions.   At this time, I do not feel there is any life-threatening condition present. I have reviewed and discussed all results (EKG, imaging, lab, urine as appropriate), exam findings with patient. I have reviewed nursing notes and appropriate previous records.  I feel the patient is safe to be discharged home without further emergent workup. Discussed usual and customary return precautions. Patient and family (if present) verbalize understanding and are comfortable with this plan.  Patient will follow-up with their primary care provider. If they do not have a primary care provider,  information for follow-up has been provided to them. All questions have been answered.  ____________________________________________  FINAL CLINICAL IMPRESSION(S) / ED DIAGNOSES  Final diagnoses:  Nausea vomiting and diarrhea     MEDICATIONS GIVEN DURING THIS VISIT:  Medications  ondansetron (ZOFRAN-ODT) disintegrating tablet 4 mg (4 mg Oral Given 12/28/15 1813)     NEW OUTPATIENT MEDICATIONS STARTED DURING THIS VISIT:  Discharge Medication List as of 12/28/2015  6:46 PM    START taking these medications   Details  ondansetron (ZOFRAN) 4 MG tablet Take 1 tablet (4 mg total) by mouth every 6 (six) hours., Starting Wed 12/28/2015, Print        Note:  This document was prepared using Dragon voice recognition software and may include unintentional dictation errors.  Nanda Quinton, MD Emergency Medicine   Margette Fast, MD 12/28/15 2322

## 2015-12-28 NOTE — Discharge Instructions (Signed)

## 2017-10-25 ENCOUNTER — Encounter (HOSPITAL_COMMUNITY): Payer: Self-pay

## 2017-10-25 ENCOUNTER — Ambulatory Visit (HOSPITAL_COMMUNITY)
Admission: EM | Admit: 2017-10-25 | Discharge: 2017-10-25 | Disposition: A | Discharge status: Home / Self Care | Payer: 59 | Attending: Internal Medicine | Admitting: Internal Medicine

## 2017-10-25 DIAGNOSIS — R1012 Left upper quadrant pain: Secondary | ICD-10-CM | POA: Diagnosis not present

## 2017-10-25 DIAGNOSIS — K581 Irritable bowel syndrome with constipation: Secondary | ICD-10-CM

## 2017-10-25 MED ORDER — POLYETHYLENE GLYCOL 3350 17 G PO PACK: 17.0000 g | PACK | Freq: Every day | ORAL | 0 refills | Status: DC

## 2017-10-25 MED ORDER — DICYCLOMINE HCL 20 MG PO TABS: 20.0000 mg | ORAL_TABLET | Freq: Two times a day (BID) | ORAL | 0 refills | Status: DC

## 2017-10-25 NOTE — Discharge Instructions (Addendum)
No alarming signs on exam. Start miralax as directed. You can also use an over the counter enema/suppository to help with bowel movement. Bentyl can help with the abdominal cramping, but can cause slower bowel movement. So take sparingly for severe cramping. Continue zofran as needed for nausea/vomiting. Bland diet, advance as tolerated. Keep hydrated, your urine should be clear to pale yellow in color. Follow up with PCP for further evaluation and management for IBS.

## 2017-10-25 NOTE — ED Triage Notes (Signed)
Pt presents with an IBS flare up

## 2017-10-25 NOTE — ED Notes (Signed)
Bed: UC01 Expected date:  Expected time:  Means of arrival:  Comments: 

## 2017-10-25 NOTE — ED Provider Notes (Signed)
[place]    CSN: [id number] Arrival date & time: 10/25/17  1438     History   Chief Complaint Chief Complaint  Patient presents with  . Abdominal Pain    APPT 234 (IBS recurrence)    HPI Samantha Noble is a 32 y.o. female.   32 year old female with history of IBS, GERD, comes in for 4-5 day history of abdominal cramping and constipation. States last weekend, had some wine and caused an IBS flare up. Had felt bloated with constipation. Took over the counter medicine 4 days ago and had a bowel movement. Since then, has not been able to move her bowels and has had nausea and few episodes of nonbilious nonbloody vomit. States LUQ cramping is intermittent, and can be associated with food. Denies blood in stool. Denies fever, chills, night sweats. Denies URI symptoms such as cough, congestion, sore throat. States has not taken other laxatives since 4 days ago. She has been taking zofran for nausea/vomiting with temporary relief. Denies urinary symptoms such as frequency, dysuria, hematuria. LMP 09/28/2017 on oral birth control. Denies abdominal surgery.      Past Medical History:  Diagnosis Date  . Acid reflux disease   . Gastritis   . GERD (gastroesophageal reflux disease)   . IBS (irritable bowel syndrome)   . Pancreatitis     There are no active problems to display for this patient.   Past Surgical History:  Procedure Laterality Date  . NO PAST SURGERIES      OB History    Gravida  0   Para      Term      Preterm      AB      Living        SAB      TAB      Ectopic      Multiple      Live Births               Home Medications    Prior to Admission medications   Medication Sig Start Date End Date Taking? Authorizing Provider  acetaminophen (TYLENOL) 500 MG tablet Take 1,000 mg by mouth every 6 (six) hours as needed. For pain     [provider]  dicyclomine (BENTYL) 20 MG tablet Take 1 tablet (20 mg total) by mouth 2  (two) times daily. 10/25/17   Tasia Catchings, Breona Cherubin V, PA-C  hydrOXYzine (ATARAX/VISTARIL) 50 MG tablet Take 50 mg by mouth 3 (three) times daily as needed.    [provider]  Multiple Vitamin (MULITIVITAMIN WITH MINERALS) TABS Take 1 tablet by mouth daily.      [provider]  ondansetron (ZOFRAN ODT) 4 MG disintegrating tablet 4mg  ODT q4 hours prn nausea/vomit 02/23/14   [name], MD  ondansetron (ZOFRAN) 4 MG tablet Take 1 tablet (4 mg total) by mouth every 6 (six) hours. 12/28/15   Long, Wonda Olds, MD  oxyCODONE-acetaminophen (PERCOCET) 5-325 MG per tablet Take 1 tablet by mouth every 6 (six) hours as needed for moderate pain. 10/15/14   Evelina Bucy, MD  polyethylene glycol [facility]) packet Take 17 g by mouth daily. 10/25/17   Ok Edwards, PA-C  promethazine (PHENERGAN) 25 MG suppository Place 1 suppository (25 mg total) rectally every 6 (six) hours as needed for nausea or vomiting. 10/15/14   Evelina Bucy, MD  sucralfate (CARAFATE) 1 G tablet Take 1 tablet (1 g total) by mouth 4 (four) times daily. 06/27/12 06/27/13  Carmin Muskrat, MD    Family History History reviewed. No pertinent family history.  Social History Social History   Tobacco Use  . Smoking status: Current Every Day Smoker    Packs/day: 0.10    Years: 2.00    Pack years: 0.20    Types: Cigarettes  . Smokeless tobacco: Never Used  Substance Use Topics  . Alcohol use: Yes    Comment: last drink SAT  . Drug use: Yes    Types: Marijuana     Allergies   Patient has no known allergies.   Review of Systems Review of Systems  Reason unable to perform ROS: See HPI as above.     Physical Exam Triage Vital Signs ED Triage Vitals [10/25/17 1450]  Enc Vitals Group     BP 131/90     Pulse Rate 90     Resp 20     Temp 98.8 F (37.1 C)     Temp Source Oral     SpO2 96 %     Weight      Height      Head Circumference      Peak Flow      Pain Score      Pain Loc      Pain Edu?      Excl. in [place]?     No data found.  Updated Vital Signs BP 131/90 (BP Location: Right Arm)   Pulse 90   Temp 98.8 F (37.1 C) (Oral)   Resp 20   LMP  (LMP Unknown)   SpO2 96%   Physical Exam  Constitutional: She is oriented to person, place, and time. She appears well-developed and well-nourished. No distress.  HENT:  Head: Normocephalic and atraumatic.  Eyes: Pupils are equal, round, and reactive to light. Conjunctivae are normal.  Cardiovascular: Normal rate, regular rhythm and normal heart sounds. Exam reveals no gallop and no friction rub.  No murmur heard. Pulmonary/Chest: Effort normal and breath sounds normal. She has no wheezes. She has no rales.  Abdominal: Soft. Bowel sounds are normal. She exhibits no mass. There is no rigidity, no rebound, no guarding and no CVA tenderness.  Mild tenderness to palpation of the LUQ.   Neurological: She is alert and oriented to person, place, and time.  Skin: Skin is warm and dry.  Psychiatric: She has a normal mood and affect. Her behavior is normal. Judgment normal.   UC Treatments / Results  Labs (all labs ordered are listed, but only abnormal results are displayed) Labs Reviewed - No data to display  EKG None  Radiology No results found.  Procedures Procedures (including critical care time)  Medications Ordered in UC Medications - No data to display  Initial Impression / Assessment and Plan / UC Course  I have reviewed the triage vital signs and the nursing notes.  Pertinent labs & imaging results that were available during my care of the patient were reviewed by me and considered in my medical decision making (see chart for details).    No alarming signs on exam.  patient to start MiraLAX for constipation.  Discussed other symptomatic treatment.  Will provide Bentyl for abdominal cramping, discussed this may cause worsening constipation, and to use sparingly for severe cramping.  She can continue Zofran for nausea/vomiting.  Bland diet,  advance as tolerated.  Push fluids.  Return precautions given.  Provided resources for PCP for further management of IBS.  Patient expresses understanding and agrees to plan.  Final  Clinical Impressions(s) / UC Diagnoses   Final diagnoses:  Left upper quadrant pain  Irritable bowel syndrome with constipation    ED Prescriptions    Medication Sig Dispense Auth. Provider   polyethylene glycol (MIRALAX) packet Take 17 g by mouth daily. 14 each Kathie Posa V, PA-C   dicyclomine (BENTYL) 20 MG tablet Take 1 tablet (20 mg total) by mouth 2 (two) times daily. 20 tablet Tobin Chad, [place] 10/25/17 1721

## 2018-03-20 ENCOUNTER — Ambulatory Visit (HOSPITAL_COMMUNITY)
Admission: RE | Admit: 2018-03-20 | Discharge: 2018-03-20 | Disposition: A | Discharge status: Home / Self Care | Payer: 59 | Attending: Psychiatry | Admitting: Psychiatry

## 2018-03-20 DIAGNOSIS — K589 Irritable bowel syndrome without diarrhea: Secondary | ICD-10-CM | POA: Diagnosis not present

## 2018-03-20 DIAGNOSIS — F1290 Cannabis use, unspecified, uncomplicated: Secondary | ICD-10-CM | POA: Diagnosis not present

## 2018-03-20 DIAGNOSIS — F17210 Nicotine dependence, cigarettes, uncomplicated: Secondary | ICD-10-CM | POA: Diagnosis not present

## 2018-03-20 DIAGNOSIS — K219 Gastro-esophageal reflux disease without esophagitis: Secondary | ICD-10-CM | POA: Diagnosis not present

## 2018-03-20 DIAGNOSIS — F411 Generalized anxiety disorder: Secondary | ICD-10-CM | POA: Insufficient documentation

## 2018-03-20 NOTE — BH Assessment (Signed)
Assessment Note  Samantha Noble is an 32 y.o. female. Pt denies SI/HI and AVH. The Pt reports recent crises in her life ie stress at work, ending a long-term relationship, and exacerbated ADHD symptoms. The Pt is a Pt at the [facility]. The Pt is seen by Elmarie Shiley and Cam. The Pt states she is compliant with her medication. The Pt reports recent panic attacks. The Pt states her anxiety has increased tremendously. Pt denies SA.  Shuvon, NP recommends D/C and referral for IOP.   Diagnosis:  F41.1 GAD  Past Medical History:  Past Medical History:  Diagnosis Date  . Acid reflux disease   . Gastritis   . GERD (gastroesophageal reflux disease)   . IBS (irritable bowel syndrome)   . Pancreatitis     Past Surgical History:  Procedure Laterality Date  . NO PAST SURGERIES      Family History: No family history on file.  Social History:  reports that she has been smoking cigarettes. She has a 0.20 pack-year smoking history. She has never used smokeless tobacco. She reports current alcohol use. She reports current drug use. Drug: Marijuana.  Additional Social History:  Alcohol / Drug Use Pain Medications: please see mar Prescriptions: please see mar Over the Counter: please see mar History of alcohol / drug use?: No history of alcohol / drug abuse Longest period of sobriety (when/how long): NA  CIWA: CIWA-Ar BP: (!) 132/92(No history of HTN reports feeling really stressed) Pulse Rate: 92 COWS:    Allergies: No Known Allergies  Home Medications: (Not in a hospital admission)   OB/GYN Status:  No LMP recorded. (Menstrual status: Oral contraceptives).  General Assessment Data Location of Assessment: [facility] Assessment Services TTS Assessment: In system Is this a Tele or Face-to-Face Assessment?: Face-to-Face Is this an Initial Assessment or a Re-assessment for this encounter?: Initial Assessment Patient Accompanied by:: N/A Language Other than English: No Living Arrangements:  Other (Comment) What gender do you identify as?: Female Marital status: Single Maiden name: NA Pregnancy Status: No Living Arrangements: Alone Can pt return to current living arrangement?: Yes Admission Status: Voluntary Is patient capable of signing voluntary admission?: Yes Referral Source: Self/Family/Friend Insurance type: [employer]  Medical Screening Exam ([place]) Medical Exam completed: Yes  Crisis Care Plan Living Arrangements: Alone Legal Guardian: Other:(self) Name of Psychiatrist: Elmarie Shiley Name of Therapist: Cam  Education Status Is patient currently in school?: No Is the patient employed, unemployed or receiving disability?: Employed  Risk to self with the past 6 months Suicidal Ideation: No Has patient been a risk to self within the past 6 months prior to admission? : No Suicidal Intent: No Has patient had any suicidal intent within the past 6 months prior to admission? : No Is patient at risk for suicide?: No Suicidal Plan?: No Has patient had any suicidal plan within the past 6 months prior to admission? : No Access to Means: No What has been your use of drugs/alcohol within the last 12 months?: NA Previous Attempts/Gestures: No How many times?: 0 Other Self Harm Risks: NA Triggers for Past Attempts: None known Intentional Self Injurious Behavior: None Family Suicide History: No Recent stressful life event(s): Other (Comment) Persecutory voices/beliefs?: No Depression: Yes Depression Symptoms: Tearfulness, Fatigue, Loss of interest in usual pleasures Substance abuse history and/or treatment for substance abuse?: No Suicide prevention information given to non-admitted patients: Not applicable  Risk to Others within the past 6 months Homicidal Ideation: No Does patient have any lifetime  risk of violence toward others beyond the six months prior to admission? : No Thoughts of Harm to Others: No Current Homicidal Intent: No Current Homicidal  Plan: No Access to Homicidal Means: No Identified Victim: NA History of harm to others?: No Assessment of Violence: None Noted Violent Behavior Description: NA Does patient have access to weapons?: No Criminal Charges Pending?: No Does patient have a court date: No Is patient on probation?: No  Psychosis Hallucinations: None noted Delusions: None noted  Mental Status Report Appearance/Hygiene: Unremarkable Eye Contact: Fair Motor Activity: Freedom of movement Speech: Logical/coherent Level of Consciousness: Alert Mood: Depressed, Anxious Affect: Anxious, Depressed Anxiety Level: Minimal Thought Processes: Coherent, Relevant Judgement: Unimpaired Orientation: Person, Place, Time, Situation Obsessive Compulsive Thoughts/Behaviors: None  Cognitive Functioning Concentration: Normal Memory: Recent Intact, Remote Intact Is patient IDD: No Insight: Fair Impulse Control: Fair Appetite: Fair Have you had any weight changes? : No Change Sleep: No Change Total Hours of Sleep: 8 Vegetative Symptoms: None  ADLScreening [facility] Assessment Services) Patient's cognitive ability adequate to safely complete daily activities?: Yes Patient able to express need for assistance with ADLs?: Yes Independently performs ADLs?: Yes (appropriate for developmental age)  Prior Inpatient Therapy Prior Inpatient Therapy: No  Prior Outpatient Therapy Prior Outpatient Therapy: Yes Prior Therapy Dates: current Prior Therapy Facilty/Provider(s): [place] Reason for Treatment: depression, ADHD Does patient have an ACCT team?: No Does patient have Intensive In-House Services?  : No Does patient have [facility] services? : No Does patient have P4CC services?: No  ADL Screening (condition at time of admission) Patient's cognitive ability adequate to safely complete daily activities?: Yes Is the patient deaf or have difficulty hearing?: No Does the patient have difficulty seeing, even when wearing  glasses/contacts?: No Does the patient have difficulty concentrating, remembering, or making decisions?: No Patient able to express need for assistance with ADLs?: Yes Does the patient have difficulty dressing or bathing?: No Independently performs ADLs?: Yes (appropriate for developmental age) Does the patient have difficulty walking or climbing stairs?: No       Abuse/Neglect Assessment (Assessment to be complete while patient is alone) Abuse/Neglect Assessment Can Be Completed: Yes Physical Abuse: Denies Verbal Abuse: Denies Sexual Abuse: Denies Exploitation of patient/patient's resources: Denies     Regulatory affairs officer (For Healthcare) Does Patient Have a Medical Advance Directive?: No Would patient like information on creating a medical advance directive?: No - Patient declined          Disposition:  Disposition Initial Assessment Completed for this Encounter: Yes Disposition of Patient: Discharge Patient refused recommended treatment: No  On Site Evaluation by:   Reviewed with Physician:    Cyndia Bent 03/20/2018 4:34 PM

## 2018-03-20 NOTE — H&P (Signed)
Behavioral Health Medical Screening Exam  Samantha Noble is an 32 y.o. female patient presents to [facility] as walk in with complaints of feeling overwhelmed and stressed related to work; and recent break up with long term boyfriend.  States that she has had panic attacks and crying spells.  States she tried to make an appointment with her outpatient psychiatric provider but the earliest she could be seen was Tuesday 03/25/18.  Patient denies suicidal/self-ham/homicidal ideation, psychosis, and paranoia.  Total Time spent with patient: 30 minutes  Psychiatric Specialty Exam: Physical Exam  Vitals reviewed. Constitutional: She is oriented to person, place, and time. She appears well-developed and well-nourished. No distress.  Neck: Normal range of motion. Neck supple.  Respiratory: Effort normal.  Musculoskeletal: Normal range of motion.  Neurological: She is alert and oriented to person, place, and time.  Skin: Skin is warm and dry.  Psychiatric: Her speech is normal and behavior is normal. Judgment and thought content normal. Her mood appears anxious (Stable). Cognition and memory are normal. She exhibits a depressed mood (Stable).    Review of Systems  Psychiatric/Behavioral: Positive for depression (Stable). Hallucinations: Denies. Substance abuse: Denies. Suicidal ideas: Denies. The patient is nervous/anxious (Stable). Insomnia: Recent episodes of not being able to sleep through the night related to feeling overwhemed.   All other systems reviewed and are negative.   Blood pressure (!) 132/92, pulse 92, temperature 97.7 F (36.5 C), resp. rate 18, SpO2 100 %.There is no height or weight on file to calculate BMI.  General Appearance: Casual  Eye Contact:  Good  Speech:  Clear and Coherent and Normal Rate  Volume:  Normal  Mood:  Anxious and Depressed  Affect:  Appropriate and Congruent  Thought Process:  Coherent and Goal Directed  Orientation:  Full (Time, Place, and Person)   Thought Content:  WDL and Logical  Suicidal Thoughts:  No  Homicidal Thoughts:  No  Memory:  Immediate;   Good Recent;   Good Remote;   Good  Judgement:  Intact  Insight:  Present  Psychomotor Activity:  Normal  Concentration: Concentration: Good and Attention Span: Good  Recall:  Good  Fund of Knowledge:Good  Language: Good  Akathisia:  No  Handed:  Right  AIMS (if indicated):     Assets:  Communication Skills Desire for Improvement Housing Physical Health Resilience Social Support  Sleep:       Musculoskeletal: Strength & Muscle Tone: within normal limits Gait & Station: normal Patient leans: N/A  Blood pressure (!) 132/92, pulse 92, temperature 97.7 F (36.5 C), resp. rate 18, SpO2 100 %.  Recommendations:  Referral to IOP; Keep scheduled appointment with outpatient psychiatric provider.  Follow up with PCP if blood pressure does not decrease or headaches blurred vision s/s of hypertension.   Disposition: No evidence of imminent risk to self or others at present.   Patient does not meet criteria for psychiatric inpatient admission. Supportive therapy provided about ongoing stressors. Refer to IOP. Discussed crisis plan, support from social network, calling 911, coming to the Emergency Department, and calling Suicide Hotline.  Based on my evaluation the patient does not appear to have an emergency medical condition.  Shuvon Rankin, NP 03/20/2018, 3:51 PM

## 2018-08-12 ENCOUNTER — Inpatient Hospital Stay (HOSPITAL_COMMUNITY)
Admission: EM | Admit: 2018-08-12 | Discharge: 2018-08-16 | DRG: 392 | Disposition: A | Discharge status: Home / Self Care | Payer: 59 | Attending: Surgery | Admitting: Surgery

## 2018-08-12 ENCOUNTER — Other Ambulatory Visit: Payer: Self-pay

## 2018-08-12 ENCOUNTER — Encounter (HOSPITAL_COMMUNITY): Payer: Self-pay | Admitting: Emergency Medicine

## 2018-08-12 DIAGNOSIS — K219 Gastro-esophageal reflux disease without esophagitis: Secondary | ICD-10-CM | POA: Diagnosis present

## 2018-08-12 DIAGNOSIS — K668 Other specified disorders of peritoneum: Secondary | ICD-10-CM | POA: Diagnosis present

## 2018-08-12 DIAGNOSIS — F17210 Nicotine dependence, cigarettes, uncomplicated: Secondary | ICD-10-CM | POA: Diagnosis present

## 2018-08-12 DIAGNOSIS — N179 Acute kidney failure, unspecified: Secondary | ICD-10-CM

## 2018-08-12 DIAGNOSIS — F1290 Cannabis use, unspecified, uncomplicated: Secondary | ICD-10-CM | POA: Diagnosis present

## 2018-08-12 DIAGNOSIS — K529 Noninfective gastroenteritis and colitis, unspecified: Principal | ICD-10-CM | POA: Diagnosis present

## 2018-08-12 DIAGNOSIS — R1084 Generalized abdominal pain: Secondary | ICD-10-CM

## 2018-08-12 DIAGNOSIS — R17 Unspecified jaundice: Secondary | ICD-10-CM | POA: Diagnosis present

## 2018-08-12 DIAGNOSIS — R109 Unspecified abdominal pain: Secondary | ICD-10-CM

## 2018-08-12 DIAGNOSIS — Z1159 Encounter for screening for other viral diseases: Secondary | ICD-10-CM

## 2018-08-12 DIAGNOSIS — R935 Abnormal findings on diagnostic imaging of other abdominal regions, including retroperitoneum: Secondary | ICD-10-CM | POA: Diagnosis present

## 2018-08-12 DIAGNOSIS — Z8719 Personal history of other diseases of the digestive system: Secondary | ICD-10-CM

## 2018-08-12 DIAGNOSIS — Z8379 Family history of other diseases of the digestive system: Secondary | ICD-10-CM

## 2018-08-12 DIAGNOSIS — F419 Anxiety disorder, unspecified: Secondary | ICD-10-CM | POA: Diagnosis present

## 2018-08-12 NOTE — ED Triage Notes (Signed)
Pt reports left abdominal and flank pain, feeling feverish, runny nose, sore throat, headache and body aches x 3 days.

## 2018-08-12 NOTE — ED Provider Notes (Signed)
[facility] EMERGENCY DEPARTMENT Provider Note   CSN: [id number] Arrival date & time: 08/12/18  2250    History   Chief Complaint Abdominal pain  HPI Samantha Noble is a 33 y.o. female.   The history is provided by the patient.  She has history of GERD, irritable bowel syndrome, pancreatitis and comes in with abdominal pain for the last 2 days.  Pain is worse in the epigastric area and left upper quadrant.  Today, she has had a sore throat, nausea, vomiting, diarrhea.  Pain does get slightly better after emesis or bowel movement, but returns quickly.  Pain does radiate into her back.  She rates pain a 10/10.  Nothing else seems to make it better or worse.  She did take a dose of ondansetron and nausea improved slightly.  She denies fever, chills, sweats.  She denies any sick contacts.  Past Medical History:  Diagnosis Date  . Acid reflux disease   . Gastritis   . GERD (gastroesophageal reflux disease)   . IBS (irritable bowel syndrome)   . Pancreatitis     There are no active problems to display for this patient.   Past Surgical History:  Procedure Laterality Date  . NO PAST SURGERIES       OB History    Gravida  0   Para      Term      Preterm      AB      Living        SAB      TAB      Ectopic      Multiple      Live Births               Home Medications    Prior to Admission medications   Medication Sig Start Date End Date Taking? Authorizing Provider  acetaminophen (TYLENOL) 500 MG tablet Take 1,000 mg by mouth every 6 (six) hours as needed. For pain     [provider]  dicyclomine (BENTYL) 20 MG tablet Take 1 tablet (20 mg total) by mouth 2 (two) times daily. 10/25/17   Tasia Catchings, Amy V, PA-C  hydrOXYzine (ATARAX/VISTARIL) 50 MG tablet Take 50 mg by mouth 3 (three) times daily as needed.    [provider]  Multiple Vitamin (MULITIVITAMIN WITH MINERALS) TABS Take 1 tablet by mouth daily.      [provider]  ondansetron (ZOFRAN ODT) 4 MG disintegrating tablet 4mg  ODT q4 hours prn nausea/vomit 02/23/14   [name], MD  ondansetron (ZOFRAN) 4 MG tablet Take 1 tablet (4 mg total) by mouth every 6 (six) hours. 12/28/15   Long, Wonda Olds, MD  oxyCODONE-acetaminophen (PERCOCET) 5-325 MG per tablet Take 1 tablet by mouth every 6 (six) hours as needed for moderate pain. 10/15/14   Evelina Bucy, MD  polyethylene glycol [facility]) packet Take 17 g by mouth daily. 10/25/17   Ok Edwards, PA-C  promethazine (PHENERGAN) 25 MG suppository Place 1 suppository (25 mg total) rectally every 6 (six) hours as needed for nausea or vomiting. 10/15/14   Evelina Bucy, MD  sucralfate (CARAFATE) 1 G tablet Take 1 tablet (1 g total) by mouth 4 (four) times daily. 06/27/12 06/27/13  Carmin Muskrat, MD    Family History History reviewed. No pertinent family history.  Social History Social History   Tobacco Use  . Smoking status: Current Some Day Smoker    Packs/day: 0.10    Years: 2.00  Pack years: 0.20    Types: Cigarettes  . Smokeless tobacco: Never Used  Substance Use Topics  . Alcohol use: Yes    Comment: last drink SAT  . Drug use: Yes    Types: Marijuana     Allergies   Patient has no known allergies.   Review of Systems Review of Systems  All other systems reviewed and are negative.    Physical Exam Updated Vital Signs BP (!) 136/99 (BP Location: Right Arm)   Pulse 83   Temp 97.9 F (36.6 C) (Oral)   Resp 18   LMP 08/10/2018   SpO2 99%   Physical Exam Vitals signs and nursing note reviewed.    34 year old female, appears uncomfortable, but is in no acute distress. Vital signs are significant for elevated blood pressure. Oxygen saturation is 99%, which is normal. Head is normocephalic and atraumatic. PERRLA, EOMI. Oropharynx is clear. Neck is nontender and supple without adenopathy or JVD. Back is nontender and there is no CVA tenderness. Lungs are clear without rales,  wheezes, or rhonchi. Chest is nontender. Heart has regular rate and rhythm without murmur. Abdomen is soft, flat, with mild tenderness in the epigastrium and left upper quadrant.  There is no rebound or guarding.  There are no masses or hepatosplenomegaly and peristalsis is slightly hypoactive. Extremities have no cyanosis or edema, full range of motion is present. Skin is warm and dry without rash. Neurologic: Mental status is normal, cranial nerves are intact, there are no motor or sensory deficits.  ED Treatments / Results  Labs (all labs ordered are listed, but only abnormal results are displayed) Labs Reviewed  COMPREHENSIVE METABOLIC PANEL - Abnormal; Notable for the following components:      Result Value   Glucose, Bld 205 (*)    BUN 21 (*)    Creatinine, Ser 1.31 (*)    Calcium 10.4 (*)    Total Protein 8.9 (*)    Albumin 5.4 (*)    Total Bilirubin 1.3 (*)    GFR calc non Af Amer 54 (*)    All other components within normal limits  CBC WITH DIFFERENTIAL/PLATELET - Abnormal; Notable for the following components:   WBC 28.0 (*)    Hemoglobin 15.2 (*)    Neutro Abs 26.2 (*)    Abs Immature Granulocytes 0.15 (*)    All other components within normal limits  URINALYSIS, ROUTINE W REFLEX MICROSCOPIC - Abnormal; Notable for the following components:   Color, Urine AMBER (*)    APPearance HAZY (*)    Specific Gravity, Urine >1.046 (*)    Ketones, ur 5 (*)    Protein, ur >=300 (*)    All other components within normal limits  GROUP A STREP BY PCR  LIPASE, BLOOD  HIV ANTIBODY (ROUTINE TESTING W REFLEX)  BASIC METABOLIC PANEL  CBC  POC URINE PREG, ED    Radiology Ct Abdomen Pelvis W Contrast  Result Date: 08/13/2018 CLINICAL DATA:  33 y/o F; left abdominal and flank pain. Runny nose, sore throat, headache, and body aches for 3 days. EXAM: CT ABDOMEN AND PELVIS WITH CONTRAST TECHNIQUE: Multidetector CT imaging of the abdomen and pelvis was performed using the standard  protocol following bolus administration of intravenous contrast. CONTRAST:  143mL OMNIPAQUE IOHEXOL 300 MG/ML  SOLN COMPARISON:  02/23/2014 CT abdomen and pelvis. FINDINGS: Lower chest: Clear lung bases. Hepatobiliary: No focal liver abnormality is seen. No gallstones, gallbladder wall thickening, or biliary dilatation. Pancreas: Unremarkable. No pancreatic ductal  dilatation or surrounding inflammatory changes. Spleen: Normal in size without focal abnormality. Adrenals/Urinary Tract: Adrenal glands are unremarkable. Kidneys are normal, without renal calculi, focal lesion, or hydronephrosis. Bladder is unremarkable. Stomach/Bowel: There is wall thickening of the ascending and transverse colon with mucosal enhancement. Normal appendix. Small bowel and stomach are unremarkable. Vascular/Lymphatic: No significant vascular findings are present. No enlarged abdominal or pelvic lymph nodes. Reproductive: Uterus and bilateral adnexa are unremarkable. Other: There is free air within the right-sided retroperitoneum, right posterior abdominal wall, extraperitoneal space surrounding right lobe of liver, midthoracic epidural space, and inferior mediastinum. Musculoskeletal: No fracture is seen. IMPRESSION: Free air within the right retroperitoneum along psoas, right posterior abdominal wall, extraperitoneal space surrounding right lobe of liver, midthoracic epidural space, and inferior mediastinum. Source of air is suspected to be the retroperitoneal portion of the ascending colon, however, a clear point of perforation is not identified. Underlying gastroduodenal ulcer is also possible. There is mild ascending and transverse colitis. These results were called by telephone at the time of interpretation on 08/13/2018 at 2:10 am to Dr. Delora Fuel , who verbally acknowledged these results. Electronically Signed   By: Kristine Garbe M.D.   On: 08/13/2018 02:14    Procedures Procedures   Medications Ordered in ED  Medications  enoxaparin (LOVENOX) injection 40 mg (has no administration in time range)  dextrose 5 %-0.9 % sodium chloride infusion (has no administration in time range)  morphine 2 MG/ML injection 1-2 mg (has no administration in time range)  diphenhydrAMINE (BENADRYL) injection 25 mg (has no administration in time range)  ondansetron (ZOFRAN-ODT) disintegrating tablet 4 mg (has no administration in time range)    Or  ondansetron (ZOFRAN) injection 4 mg (has no administration in time range)  pantoprazole (PROTONIX) injection 40 mg (has no administration in time range)  piperacillin-tazobactam (ZOSYN) IVPB 3.375 g (has no administration in time range)  sodium chloride 0.9 % bolus 1,000 mL (0 mLs Intravenous Stopped 08/13/18 0148)  metoCLOPramide (REGLAN) injection 10 mg (10 mg Intravenous Given 08/13/18 0024)  diphenhydrAMINE (BENADRYL) injection 25 mg (25 mg Intravenous Given 08/13/18 0024)  dicyclomine (BENTYL) capsule 20 mg (20 mg Oral Given 08/13/18 0030)  morphine 4 MG/ML injection 4 mg (4 mg Intravenous Given 08/13/18 0045)  morphine 4 MG/ML injection 4 mg (4 mg Intravenous Given 08/13/18 0200)  prochlorperazine (COMPAZINE) injection 10 mg (10 mg Intravenous Given 08/13/18 0200)  sodium chloride 0.9 % bolus 1,000 mL (1,000 mLs Intravenous New Bag/Given 08/13/18 0200)  iohexol (OMNIPAQUE) 300 MG/ML solution 100 mL (100 mLs Intravenous Contrast Given 08/13/18 0148)  piperacillin-tazobactam (ZOSYN) IVPB 3.375 g (3.375 g Intravenous New Bag/Given 08/13/18 0249)  HYDROmorphone (DILAUDID) injection 1 mg (1 mg Intravenous Given 08/13/18 0246)     Initial Impression / Assessment and Plan / ED Course  I have reviewed the triage vital signs and the nursing notes.  Pertinent labs & imaging results that were available during my care of the patient were reviewed by me and considered in my medical decision making (see chart for details).  Abdominal pain with nausea and vomiting of uncertain cause.  Exam is benign  although patient is complaining of severe pain.  Oropharynx appears benign, but will send strep screen.  Will check screening labs and give IV fluids and metoclopramide.  Old records are reviewed, and she has had similar ED visits in the past, but somewhat infrequently.  We will also give a trial of dicyclomine.  WBC has come back markedly elevated at 28.0,  with 98% neutrophils.  Urinalysis is significant for proteinuria and high specific gravity indicating dehydration.  She is given morphine for pain and will get CT of abdomen and pelvis.  Metabolic panel and lipase are still pending at this point.  Metabolic panel which does show slight rise in creatinine although most recent test for comparison was from 2017.  Lipase is normal.  CT scan shows free air that appears to be in the retroperitoneal space.  I suspect this is from a penetrating duodenal ulcer, but radiologist also noted changes of colitis in the ascending colon and that is another possible source of the air.  She is given Zosyn intravenously.  Case is discussed with Dr. Mike Craze is on-call for general surgery who is coming to evaluate the patient for admission.  Final Clinical Impressions(s) / ED Diagnoses   Final diagnoses:  Intra-abdominal free air of unknown etiology  Acute kidney injury (nontraumatic) (HCC)  Total bilirubin, elevated    ED Discharge Orders    None       Delora Fuel, MD 59/47/07 [phone]

## 2018-08-13 ENCOUNTER — Inpatient Hospital Stay (HOSPITAL_COMMUNITY): Payer: 59

## 2018-08-13 ENCOUNTER — Emergency Department (HOSPITAL_COMMUNITY): Payer: 59

## 2018-08-13 DIAGNOSIS — K529 Noninfective gastroenteritis and colitis, unspecified: Secondary | ICD-10-CM | POA: Diagnosis present

## 2018-08-13 DIAGNOSIS — F419 Anxiety disorder, unspecified: Secondary | ICD-10-CM | POA: Diagnosis present

## 2018-08-13 DIAGNOSIS — R17 Unspecified jaundice: Secondary | ICD-10-CM | POA: Diagnosis present

## 2018-08-13 DIAGNOSIS — F1290 Cannabis use, unspecified, uncomplicated: Secondary | ICD-10-CM | POA: Diagnosis present

## 2018-08-13 DIAGNOSIS — K668 Other specified disorders of peritoneum: Secondary | ICD-10-CM | POA: Diagnosis present

## 2018-08-13 DIAGNOSIS — Z8719 Personal history of other diseases of the digestive system: Secondary | ICD-10-CM | POA: Diagnosis not present

## 2018-08-13 DIAGNOSIS — K219 Gastro-esophageal reflux disease without esophagitis: Secondary | ICD-10-CM | POA: Diagnosis present

## 2018-08-13 DIAGNOSIS — F17210 Nicotine dependence, cigarettes, uncomplicated: Secondary | ICD-10-CM | POA: Diagnosis present

## 2018-08-13 DIAGNOSIS — R1084 Generalized abdominal pain: Secondary | ICD-10-CM | POA: Diagnosis not present

## 2018-08-13 DIAGNOSIS — Z1159 Encounter for screening for other viral diseases: Secondary | ICD-10-CM | POA: Diagnosis not present

## 2018-08-13 DIAGNOSIS — R935 Abnormal findings on diagnostic imaging of other abdominal regions, including retroperitoneum: Secondary | ICD-10-CM | POA: Diagnosis present

## 2018-08-13 DIAGNOSIS — Z8379 Family history of other diseases of the digestive system: Secondary | ICD-10-CM | POA: Diagnosis not present

## 2018-08-13 LAB — COMPREHENSIVE METABOLIC PANEL
ALT: 24 U/L (ref 0–44)
AST: 27 U/L (ref 15–41)
Albumin: 5.4 g/dL — ABNORMAL HIGH (ref 3.5–5.0)
Alkaline Phosphatase: 48 U/L (ref 38–126)
Anion gap: 15 (ref 5–15)
BUN: 21 mg/dL — ABNORMAL HIGH (ref 6–20)
CO2: 23 mmol/L (ref 22–32)
Calcium: 10.4 mg/dL — ABNORMAL HIGH (ref 8.9–10.3)
Chloride: 102 mmol/L (ref 98–111)
Creatinine, Ser: 1.31 mg/dL — ABNORMAL HIGH (ref 0.44–1.00)
GFR calc Af Amer: >60 mL/min (ref >60)
GFR calc non Af Amer: 54 mL/min — ABNORMAL LOW (ref >60)
Glucose, Bld: 205 mg/dL — ABNORMAL HIGH (ref 70–99)
Potassium: 4.2 mmol/L (ref 3.5–5.1)
Sodium: 140 mmol/L (ref 135–145)
Total Bilirubin: 1.3 mg/dL — ABNORMAL HIGH (ref 0.3–1.2)
Total Protein: 8.9 g/dL — ABNORMAL HIGH (ref 6.5–8.1)

## 2018-08-13 LAB — BASIC METABOLIC PANEL
Anion gap: 9 (ref 5–15)
BUN: 14 mg/dL (ref 6–20)
CO2: 21 mmol/L — ABNORMAL LOW (ref 22–32)
Calcium: 8.9 mg/dL (ref 8.9–10.3)
Chloride: 108 mmol/L (ref 98–111)
Creatinine, Ser: 0.92 mg/dL (ref 0.44–1.00)
GFR calc Af Amer: >60 mL/min (ref >60)
GFR calc non Af Amer: >60 mL/min (ref >60)
Glucose, Bld: 131 mg/dL — ABNORMAL HIGH (ref 70–99)
Potassium: 4.3 mmol/L (ref 3.5–5.1)
Sodium: 138 mmol/L (ref 135–145)

## 2018-08-13 LAB — POC URINE PREG, ED: Preg Test, Ur: NEGATIVE

## 2018-08-13 LAB — URINALYSIS, ROUTINE W REFLEX MICROSCOPIC
Bacteria, UA: NONE SEEN
Bilirubin Urine: NEGATIVE
Glucose, UA: NEGATIVE mg/dL
Hgb urine dipstick: NEGATIVE
Ketones, ur: 5 mg/dL — AB
Leukocytes,Ua: NEGATIVE
Nitrite: NEGATIVE
Protein, ur: >=300 mg/dL — AB
Specific Gravity, Urine: >1.046 — ABNORMAL HIGH (ref 1.005–1.030)
pH: 5 (ref 5.0–8.0)

## 2018-08-13 LAB — CBC WITH DIFFERENTIAL/PLATELET
Abs Immature Granulocytes: 0.15 10*3/uL — ABNORMAL HIGH (ref 0.00–0.07)
Basophils Absolute: 0 10*3/uL (ref 0.0–0.1)
Basophils Relative: 0 %
Eosinophils Absolute: 0 10*3/uL (ref 0.0–0.5)
Eosinophils Relative: 0 %
HCT: 45 % (ref 36.0–46.0)
Hemoglobin: 15.2 g/dL — ABNORMAL HIGH (ref 12.0–15.0)
Immature Granulocytes: 1 %
Lymphocytes Relative: 4 %
Lymphs Abs: 1.1 10*3/uL (ref 0.7–4.0)
MCH: 30.9 pg (ref 26.0–34.0)
MCHC: 33.8 g/dL (ref 30.0–36.0)
MCV: 91.5 fL (ref 80.0–100.0)
Monocytes Absolute: 0.5 10*3/uL (ref 0.1–1.0)
Monocytes Relative: 2 %
Neutro Abs: 26.2 10*3/uL — ABNORMAL HIGH (ref 1.7–7.7)
Neutrophils Relative %: 93 %
Platelets: 327 10*3/uL (ref 150–400)
RBC: 4.92 MIL/uL (ref 3.87–5.11)
RDW: 12.9 % (ref 11.5–15.5)
WBC: 28 10*3/uL — ABNORMAL HIGH (ref 4.0–10.5)
nRBC: 0 % (ref 0.0–0.2)

## 2018-08-13 LAB — CBC
HCT: 39.7 % (ref 36.0–46.0)
Hemoglobin: 13.2 g/dL (ref 12.0–15.0)
MCH: 30.4 pg (ref 26.0–34.0)
MCHC: 33.2 g/dL (ref 30.0–36.0)
MCV: 91.5 fL (ref 80.0–100.0)
Platelets: 249 10*3/uL (ref 150–400)
RBC: 4.34 MIL/uL (ref 3.87–5.11)
RDW: 12.8 % (ref 11.5–15.5)
WBC: 20.4 10*3/uL — ABNORMAL HIGH (ref 4.0–10.5)
nRBC: 0 % (ref 0.0–0.2)

## 2018-08-13 LAB — SARS CORONAVIRUS 2 BY RT PCR (HOSPITAL ORDER, PERFORMED IN ~~LOC~~ HOSPITAL LAB): SARS Coronavirus 2: NEGATIVE

## 2018-08-13 LAB — HIV ANTIBODY (ROUTINE TESTING W REFLEX): HIV Screen 4th Generation wRfx: NONREACTIVE

## 2018-08-13 LAB — GROUP A STREP BY PCR: Group A Strep by PCR: NOT DETECTED

## 2018-08-13 LAB — LIPASE, BLOOD: Lipase: 24 U/L (ref 11–51)

## 2018-08-13 MED ORDER — PROCHLORPERAZINE EDISYLATE 10 MG/2ML IJ SOLN
10.0000 mg | Freq: Once | INTRAMUSCULAR | Status: AC
  Administered 2018-08-13: 02:00:00 10 mg via INTRAVENOUS
  Filled 2018-08-13: qty 2

## 2018-08-13 MED ORDER — ENOXAPARIN SODIUM 40 MG/0.4ML ~~LOC~~ SOLN
40.0000 mg | SUBCUTANEOUS | Status: DC
  Administered 2018-08-14 – 2018-08-15 (×2): 40 mg via SUBCUTANEOUS
  Filled 2018-08-13 (×2): qty 0.4

## 2018-08-13 MED ORDER — SODIUM CHLORIDE 0.9 % IV BOLUS
1000.0000 mL | Freq: Once | INTRAVENOUS | Status: AC
  Administered 2018-08-13: 1000 mL via INTRAVENOUS

## 2018-08-13 MED ORDER — METOCLOPRAMIDE HCL 5 MG/ML IJ SOLN
10.0000 mg | Freq: Once | INTRAMUSCULAR | Status: AC
  Administered 2018-08-13: 10 mg via INTRAVENOUS
  Filled 2018-08-13: qty 2

## 2018-08-13 MED ORDER — ONDANSETRON HCL 4 MG/2ML IJ SOLN
4.0000 mg | Freq: Four times a day (QID) | INTRAMUSCULAR | Status: DC | PRN
  Administered 2018-08-14 – 2018-08-15 (×3): 4 mg via INTRAVENOUS
  Filled 2018-08-13 (×3): qty 2

## 2018-08-13 MED ORDER — PROMETHAZINE HCL 25 MG/ML IJ SOLN
12.5000 mg | Freq: Four times a day (QID) | INTRAMUSCULAR | Status: DC | PRN
  Administered 2018-08-14 – 2018-08-15 (×3): 25 mg via INTRAVENOUS
  Administered 2018-08-15 – 2018-08-16 (×2): 12.5 mg via INTRAVENOUS
  Filled 2018-08-13 (×5): qty 1

## 2018-08-13 MED ORDER — DICYCLOMINE HCL 10 MG PO CAPS
20.0000 mg | ORAL_CAPSULE | Freq: Once | ORAL | Status: AC
  Administered 2018-08-13: 01:00:00 20 mg via ORAL
  Filled 2018-08-13: qty 2

## 2018-08-13 MED ORDER — ACETAMINOPHEN 325 MG PO TABS
650.0000 mg | ORAL_TABLET | Freq: Four times a day (QID) | ORAL | Status: DC | PRN
  Administered 2018-08-13 – 2018-08-14 (×2): 650 mg via ORAL
  Filled 2018-08-13 (×2): qty 2

## 2018-08-13 MED ORDER — ONDANSETRON 4 MG PO TBDP
4.0000 mg | ORAL_TABLET | Freq: Four times a day (QID) | ORAL | Status: DC | PRN
  Administered 2018-08-13: 12:00:00 4 mg via ORAL
  Filled 2018-08-13: qty 1

## 2018-08-13 MED ORDER — MORPHINE SULFATE (PF) 4 MG/ML IV SOLN
4.0000 mg | Freq: Once | INTRAVENOUS | Status: AC
  Administered 2018-08-13: 02:00:00 4 mg via INTRAVENOUS
  Filled 2018-08-13: qty 1

## 2018-08-13 MED ORDER — PIPERACILLIN-TAZOBACTAM 3.375 G IVPB 30 MIN
3.3750 g | Freq: Once | INTRAVENOUS | Status: AC
  Administered 2018-08-13: 03:00:00 3.375 g via INTRAVENOUS
  Filled 2018-08-13: qty 50

## 2018-08-13 MED ORDER — DIPHENHYDRAMINE HCL 50 MG/ML IJ SOLN
25.0000 mg | Freq: Three times a day (TID) | INTRAMUSCULAR | Status: DC | PRN
  Administered 2018-08-13 – 2018-08-14 (×2): 25 mg via INTRAVENOUS
  Filled 2018-08-13 (×2): qty 1

## 2018-08-13 MED ORDER — PROMETHAZINE HCL 25 MG/ML IJ SOLN
25.0000 mg | Freq: Once | INTRAMUSCULAR | Status: AC
  Administered 2018-08-13: 14:00:00 25 mg via INTRAVENOUS
  Filled 2018-08-13: qty 1

## 2018-08-13 MED ORDER — DEXTROSE-NACL 5-0.9 % IV SOLN
INTRAVENOUS | Status: DC
  Administered 2018-08-13 – 2018-08-15 (×5): via INTRAVENOUS

## 2018-08-13 MED ORDER — PANTOPRAZOLE SODIUM 40 MG IV SOLR
40.0000 mg | Freq: Every day | INTRAVENOUS | Status: DC
  Administered 2018-08-13: 21:00:00 40 mg via INTRAVENOUS
  Filled 2018-08-13: qty 40

## 2018-08-13 MED ORDER — MORPHINE SULFATE (PF) 4 MG/ML IV SOLN
4.0000 mg | Freq: Once | INTRAVENOUS | Status: AC
  Administered 2018-08-13: 4 mg via INTRAVENOUS
  Filled 2018-08-13: qty 1

## 2018-08-13 MED ORDER — DIPHENHYDRAMINE HCL 50 MG/ML IJ SOLN: 25.0000 mg | Freq: Every evening | INTRAMUSCULAR | Status: DC | PRN

## 2018-08-13 MED ORDER — HYDROMORPHONE HCL 1 MG/ML IJ SOLN
1.0000 mg | INTRAMUSCULAR | Status: DC | PRN
  Administered 2018-08-13 – 2018-08-16 (×12): 1 mg via INTRAVENOUS
  Filled 2018-08-13 (×12): qty 1

## 2018-08-13 MED ORDER — HYDROMORPHONE HCL 1 MG/ML IJ SOLN
1.0000 mg | Freq: Once | INTRAMUSCULAR | Status: AC
  Administered 2018-08-13: 03:00:00 1 mg via INTRAVENOUS
  Filled 2018-08-13: qty 1

## 2018-08-13 MED ORDER — PIPERACILLIN-TAZOBACTAM 3.375 G IVPB
3.3750 g | Freq: Three times a day (TID) | INTRAVENOUS | Status: DC
  Administered 2018-08-13 (×2): 3.375 g via INTRAVENOUS
  Filled 2018-08-13 (×4): qty 50

## 2018-08-13 MED ORDER — DICYCLOMINE HCL 10 MG/ML IM SOLN
20.0000 mg | Freq: Once | INTRAMUSCULAR | Status: DC
  Filled 2018-08-13: qty 2

## 2018-08-13 MED ORDER — IOHEXOL 300 MG/ML  SOLN
100.0000 mL | Freq: Once | INTRAMUSCULAR | Status: AC | PRN
  Administered 2018-08-13: 100 mL via INTRAVENOUS

## 2018-08-13 MED ORDER — SODIUM CHLORIDE 0.9 % IV BOLUS
1000.0000 mL | Freq: Once | INTRAVENOUS | Status: AC
  Administered 2018-08-13: 02:00:00 1000 mL via INTRAVENOUS

## 2018-08-13 MED ORDER — HYDROMORPHONE HCL 1 MG/ML IJ SOLN
1.0000 mg | Freq: Once | INTRAMUSCULAR | Status: AC
  Administered 2018-08-13: 14:00:00 1 mg via INTRAVENOUS
  Filled 2018-08-13: qty 1

## 2018-08-13 MED ORDER — SODIUM CHLORIDE 0.9 % IV BOLUS
500.0000 mL | Freq: Once | INTRAVENOUS | Status: AC
  Administered 2018-08-13: 15:00:00 500 mL via INTRAVENOUS

## 2018-08-13 MED ORDER — DIPHENHYDRAMINE HCL 50 MG/ML IJ SOLN
25.0000 mg | Freq: Once | INTRAMUSCULAR | Status: AC
  Administered 2018-08-13: 25 mg via INTRAVENOUS
  Filled 2018-08-13: qty 1

## 2018-08-13 MED ORDER — MORPHINE SULFATE (PF) 2 MG/ML IV SOLN
1.0000 mg | INTRAVENOUS | Status: DC | PRN
  Administered 2018-08-13 (×2): 2 mg via INTRAVENOUS
  Filled 2018-08-13 (×2): qty 1

## 2018-08-13 NOTE — Progress Notes (Signed)
Around [id number] pt took a shower and after that began complaining of increasing pain, sharp and burning in lower left quadrant. Zofran Sublingual given and Morphine 2 mg IV without relief. Patient began near constant mostly dry retching, rocking herself and loud crying. PA notified came to unit; orders received for pain medication (Dilaudid 1 mg IV) and Phenergan IV (slow IV push diluted in 10CC of NS), and 500 cc NS bolus. Given and symptoms relieved by 1430. PA stopped by again to check on patient. Simmie Davies RN

## 2018-08-13 NOTE — Progress Notes (Signed)
Pharmacy Antibiotic Note  Samantha Noble is a 33 y.o. female admitted on 08/12/2018 with abdominal pain and possible perforation.  Pharmacy has been consulted for zosyn dosing.  AF, ABC 28, VSS.   SCr 1.31 (BL <1)  Plan: Zosyn 3.375g IV every 8 hours Monitor renal function, GI recs and LOT     Temp (24hrs), Avg:97.9 F (36.6 C), Min:97.9 F (36.6 C), Max:97.9 F (36.6 C)  Recent Labs  Lab 08/13/18 0004  WBC 28.0*  CREATININE 1.31*    CrCl cannot be calculated (Unknown ideal weight.).    No Known Allergies  Antimicrobials this admission: Zosyn 5/6>>  Dose adjustments this admission: n/a  Microbiology results:   Bertis Ruddy, PharmD Clinical Pharmacist Please check AMION for all [place] numbers 08/13/2018 3:30 AM

## 2018-08-13 NOTE — Progress Notes (Signed)
Subjective: CC: Abdominal pain Patient reports that her pain is much improved this morning.  She reports it was a 10/10 yesterday and is currently 3/10.  Her nausea has resolved.  She continues to pass gas.  She did have 3 normal BMs yesterday.  She reports her pain yesterday was similar to the pain she has had over the last 2 years.  It usually comes and goes and is worsened postprandial.  Is been worsening over the last 2 weeks, and became more severe in the last 2 days.  It is a pressure-like sensation in her epigastrium and left upper quadrant and states it feels "like I have to burp or pass gas".  She states that she does have a long history of prior alcohol use up until 3 years ago.  She was using a large amount of NSAIDs last week when she was on her menstrual cycle.  She previously followed with Dr. Oletta Lamas of [facility].  She reports she does smoke marijuana and usually is able to relieve her normal symptoms with a hot shower but this did not help yesterday.   Objective: Vital signs in last 24 hours: Temp:  [97.9 F (36.6 C)-98.4 F (36.9 C)] 98.4 F (36.9 C) (05/06 0823) Pulse Rate:  [64-94] 94 (05/06 0823) Resp:  [12-21] 18 (05/06 0823) BP: (102-140)/(64-117) 105/69 (05/06 0823) SpO2:  [97 %-100 %] 100 % (05/06 0823)    Intake/Output from previous day: 05/05 0701 - 05/06 0700 In: 2036.5 [IV Piggyback:2036.5] Out: -  Intake/Output this shift: No intake/output data recorded.  PE: Gen: Awake and alert, NAD Heart: RRR Lungs: CTA bilaterally, normal effort Abd: Soft, ND, NT, normoactive BS. No prior abdominal scars. Negative Murphy's sign.  Msk: No edema   Lab Results:  Recent Labs    08/13/18 0004 08/13/18 0537  WBC 28.0* 20.4*  HGB 15.2* 13.2  HCT 45.0 39.7  PLT 327 249   BMET Recent Labs    08/13/18 0004 08/13/18 0537  NA 140 138  K 4.2 4.3  CL 102 108  CO2 23 21*  GLUCOSE 205* 131*  BUN 21* 14  CREATININE 1.31* 0.92  CALCIUM 10.4* 8.9   PT/INR  No results for input(s): LABPROT, INR in the last 72 hours. CMP     Component Value Date/Time   NA 138 08/13/2018 0537   K 4.3 08/13/2018 0537   CL 108 08/13/2018 0537   CO2 21 (L) 08/13/2018 0537   GLUCOSE 131 (H) 08/13/2018 0537   BUN 14 08/13/2018 0537   CREATININE 0.92 08/13/2018 0537   CALCIUM 8.9 08/13/2018 0537   PROT 8.9 (H) 08/13/2018 0004   ALBUMIN 5.4 (H) 08/13/2018 0004   AST 27 08/13/2018 0004   ALT 24 08/13/2018 0004   ALKPHOS 48 08/13/2018 0004   BILITOT 1.3 (H) 08/13/2018 0004   GFRNONAA >60 08/13/2018 0537   GFRAA >60 08/13/2018 0537   Lipase     Component Value Date/Time   LIPASE 24 08/13/2018 0004       Studies/Results: Ct Abdomen Pelvis W Contrast  Result Date: 08/13/2018 CLINICAL DATA:  33 y/o F; left abdominal and flank pain. Runny nose, sore throat, headache, and body aches for 3 days. EXAM: CT ABDOMEN AND PELVIS WITH CONTRAST TECHNIQUE: Multidetector CT imaging of the abdomen and pelvis was performed using the standard protocol following bolus administration of intravenous contrast. CONTRAST:  111mL OMNIPAQUE IOHEXOL 300 MG/ML  SOLN COMPARISON:  02/23/2014 CT abdomen and pelvis. FINDINGS:  Lower chest: Clear lung bases. Hepatobiliary: No focal liver abnormality is seen. No gallstones, gallbladder wall thickening, or biliary dilatation. Pancreas: Unremarkable. No pancreatic ductal dilatation or surrounding inflammatory changes. Spleen: Normal in size without focal abnormality. Adrenals/Urinary Tract: Adrenal glands are unremarkable. Kidneys are normal, without renal calculi, focal lesion, or hydronephrosis. Bladder is unremarkable. Stomach/Bowel: There is wall thickening of the ascending and transverse colon with mucosal enhancement. Normal appendix. Small bowel and stomach are unremarkable. Vascular/Lymphatic: No significant vascular findings are present. No enlarged abdominal or pelvic lymph nodes. Reproductive: Uterus and bilateral adnexa are unremarkable.  Other: There is free air within the right-sided retroperitoneum, right posterior abdominal wall, extraperitoneal space surrounding right lobe of liver, midthoracic epidural space, and inferior mediastinum. Musculoskeletal: No fracture is seen. IMPRESSION: Free air within the right retroperitoneum along psoas, right posterior abdominal wall, extraperitoneal space surrounding right lobe of liver, midthoracic epidural space, and inferior mediastinum. Source of air is suspected to be the retroperitoneal portion of the ascending colon, however, a clear point of perforation is not identified. Underlying gastroduodenal ulcer is also possible. There is mild ascending and transverse colitis. These results were called by telephone at the time of interpretation on 08/13/2018 at 2:10 am to Dr. Delora Fuel , who verbally acknowledged these results. Electronically Signed   By: Kristine Garbe M.D.   On: 08/13/2018 02:14    Anti-infectives: Anti-infectives (From admission, onward)   Start     Dose/Rate Route Frequency Ordered Stop   08/13/18 1100  piperacillin-tazobactam (ZOSYN) IVPB 3.375 g     3.375 g 12.5 mL/hr over 240 Minutes Intravenous Every 8 hours 08/13/18 0332     08/13/18 0215  piperacillin-tazobactam (ZOSYN) IVPB 3.375 g     3.375 g 100 mL/hr over 30 Minutes Intravenous  Once 08/13/18 0214 08/13/18 0319       Assessment/Plan Abdominal pain with retroperitoneal free air - CT scan with free air within right retroperitoneum along psoas, right posterior abdominal wall, extraperitoneal space surrounding right lobe of liver, midthoracic epidural space, and inferior mediastinum - Leukocytosis improving 28 > 20.4. Cont IV Zosyn - 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 NSAID use last week. Continue Protonix  - Benign abdominal exam. No peritonitis. No indication for emergent surgery. - Will plan for UGI study tomorrow. Leave NPO today for bowel rest.  - Will need follow up w/ GI. Previously followed  with [facility] (Dr. Oletta Lamas).   FEN - NPO, IVF, K 4.3  VTE - Lovenox, SCD, Mobilize  ID - Zosyn    LOS: 0 days    Jillyn Ledger , [facility] 08/13/2018, 9:33 AM Pager: [phone]

## 2018-08-13 NOTE — ED Notes (Signed)
ED TO INPATIENT HANDOFF REPORT  ED Nurse Name and Phone #: Tray Martinique, [phone]  S Name/Age/Gender Samantha Noble 33 y.o. female Room/Bed: 028C/028C  Code Status   Code Status: Full Code  Home/SNF/Other Home Patient oriented to: self, place, time and situation Is this baseline? Yes   Triage Complete: Triage complete  Chief Complaint Stomach Pain,Weakness,SOB  Triage Note Pt reports left abdominal and flank pain, feeling feverish, runny nose, sore throat, headache and body aches x 3 days.    Allergies No Known Allergies  Level of Care/Admitting Diagnosis ED Disposition    ED Disposition Condition Comment   Admit  Hospital Area: [facility] [[id number]]  Level of Care: Med-Surg [16]  Covid Evaluation: Screening Protocol (No Symptoms)  Diagnosis: Intra-abdominal free air of unknown etiology [481856]  Admitting Physician: CCS, [place]  Attending Physician: CCS, MD [[id number]]  Bed request comments: 6N  PT Class (Do Not Modify): Observation [104]  PT Acc Code (Do Not Modify): Observation [10022]       B Medical/Surgery History Past Medical History:  Diagnosis Date  . Acid reflux disease   . Gastritis   . GERD (gastroesophageal reflux disease)   . IBS (irritable bowel syndrome)   . Pancreatitis    Past Surgical History:  Procedure Laterality Date  . NO PAST SURGERIES       A IV Location/Drains/Wounds Patient Lines/Drains/Airways Status   Active Line/Drains/Airways    Name:   Placement date:   Placement time:   Site:   Days:   Peripheral IV 12/28/15 Right Antecubital   12/28/15    1813    Antecubital   959   Peripheral IV 08/13/18 Right Antecubital   08/13/18    0023    Antecubital   less than 1          Intake/Output Last 24 hours  Intake/Output Summary (Last 24 hours) at 08/13/2018 0459 Last data filed at 08/13/2018 0415 Gross per 24 hour  Intake 2036.48 ml  Output -  Net 2036.48 ml    Labs/Imaging Results for orders placed or  performed during the hospital encounter of 08/12/18 (from the past 48 hour(s))  Comprehensive metabolic panel     Status: Abnormal   Collection Time: 08/13/18 12:04 AM  Result Value Ref Range   Sodium 140 135 - 145 mmol/L   Potassium 4.2 3.5 - 5.1 mmol/L   Chloride 102 98 - 111 mmol/L   CO2 23 22 - 32 mmol/L   Glucose, Bld 205 (H) 70 - 99 mg/dL   BUN 21 (H) 6 - 20 mg/dL   Creatinine, Ser 1.31 (H) 0.44 - 1.00 mg/dL   Calcium 10.4 (H) 8.9 - 10.3 mg/dL   Total Protein 8.9 (H) 6.5 - 8.1 g/dL   Albumin 5.4 (H) 3.5 - 5.0 g/dL   AST 27 15 - 41 U/L   ALT 24 0 - 44 U/L   Alkaline Phosphatase 48 38 - 126 U/L   Total Bilirubin 1.3 (H) 0.3 - 1.2 mg/dL   GFR calc non Af Amer 54 (L) >60 mL/min   GFR calc Af Amer >60 >60 mL/min   Anion gap 15 5 - 15    Comment: Performed at [facility], [address] [place], [place] [address]  Lipase, blood     Status: None   Collection Time: 08/13/18 12:04 AM  Result Value Ref Range   Lipase 24 11 - 51 U/L    Comment: Performed at [facility], [place] [address], [place], [place] [address]  CBC with Differential     Status: Abnormal   Collection Time: 08/13/18 12:04 AM  Result Value Ref Range   WBC 28.0 (H) 4.0 - 10.5 K/uL   RBC 4.92 3.87 - 5.11 MIL/uL   Hemoglobin 15.2 (H) 12.0 - 15.0 g/dL   HCT 45.0 36.0 - 46.0 %   MCV 91.5 80.0 - 100.0 fL   MCH 30.9 26.0 - 34.0 pg   MCHC 33.8 30.0 - 36.0 g/dL   RDW 12.9 11.5 - 15.5 %   Platelets 327 150 - 400 K/uL   nRBC 0.0 0.0 - 0.2 %   Neutrophils Relative % 93 %   Neutro Abs 26.2 (H) 1.7 - 7.7 K/uL   Lymphocytes Relative 4 %   Lymphs Abs 1.1 0.7 - 4.0 K/uL   Monocytes Relative 2 %   Monocytes Absolute 0.5 0.1 - 1.0 K/uL   Eosinophils Relative 0 %   Eosinophils Absolute 0.0 0.0 - 0.5 K/uL   Basophils Relative 0 %   Basophils Absolute 0.0 0.0 - 0.1 K/uL   Immature Granulocytes 1 %   Abs Immature Granulocytes 0.15 (H) 0.00 - 0.07 K/uL    Comment: Performed at [facility] [address], [place], [place] [address]  Urinalysis, Routine w reflex microscopic     Status: Abnormal   Collection Time: 08/13/18 12:10 AM  Result Value Ref Range   Color, Urine AMBER (A) YELLOW    Comment: BIOCHEMICALS MAY BE AFFECTED BY COLOR   APPearance HAZY (A) CLEAR   Specific Gravity, Urine >1.046 (H) 1.005 - 1.030   pH 5.0 5.0 - 8.0   Glucose, UA NEGATIVE NEGATIVE mg/dL   Hgb urine dipstick NEGATIVE NEGATIVE   Bilirubin Urine NEGATIVE NEGATIVE   Ketones, ur 5 (A) NEGATIVE mg/dL   Protein, ur >=300 (A) NEGATIVE mg/dL   Nitrite NEGATIVE NEGATIVE   Leukocytes,Ua NEGATIVE NEGATIVE   RBC / HPF 0-5 0 - 5 RBC/hpf   WBC, UA 0-5 0 - 5 WBC/hpf   Bacteria, UA NONE SEEN NONE SEEN   Squamous Epithelial / LPF 0-5 0 - 5   Mucus PRESENT    Hyaline Casts, UA PRESENT     Comment: Performed at [facility], [place] [address], [place], [place] [address]  POC urine preg, ED     Status: None   Collection Time: 08/13/18 12:19 AM  Result Value Ref Range   Preg Test, Ur NEGATIVE NEGATIVE    Comment:        THE SENSITIVITY OF THIS METHODOLOGY IS >24 mIU/mL   Group A Strep by PCR     Status: None   Collection Time: 08/13/18 12:34 AM  Result Value Ref Range   Group A Strep by PCR NOT DETECTED NOT DETECTED    Comment: Performed at [facility], [place] [address] [place], [place] [address]   Ct Abdomen Pelvis W Contrast  Result Date: 08/13/2018 CLINICAL DATA:  33 y/o F; left abdominal and flank pain. Runny nose, sore throat, headache, and body aches for 3 days. EXAM: CT ABDOMEN AND PELVIS WITH CONTRAST TECHNIQUE: Multidetector CT imaging of the abdomen and pelvis was performed using the standard protocol following bolus administration of intravenous contrast. CONTRAST:  158mL OMNIPAQUE IOHEXOL 300 MG/ML  SOLN COMPARISON:  02/23/2014 CT abdomen and pelvis. FINDINGS: Lower chest: Clear lung bases. Hepatobiliary: No focal liver abnormality is seen. No gallstones, gallbladder wall thickening, or biliary  dilatation. Pancreas: Unremarkable. No pancreatic ductal dilatation or surrounding inflammatory changes. Spleen: Normal in size without focal abnormality. Adrenals/Urinary Tract: Adrenal glands are unremarkable. Kidneys are normal, without renal calculi, focal lesion, or hydronephrosis. Bladder is unremarkable. Stomach/Bowel: There is wall thickening of the ascending and transverse colon with mucosal enhancement. Normal appendix. Small bowel and stomach are unremarkable. Vascular/Lymphatic: No significant vascular findings are present. No enlarged abdominal or pelvic lymph nodes. Reproductive: Uterus and bilateral adnexa are unremarkable. Other: There is free air within the right-sided retroperitoneum, right posterior abdominal wall, extraperitoneal space surrounding right lobe of liver, midthoracic epidural space, and inferior mediastinum. Musculoskeletal: No fracture is seen. IMPRESSION: Free air within the right retroperitoneum along psoas, right posterior abdominal wall, extraperitoneal space surrounding right lobe of liver, midthoracic epidural space, and inferior mediastinum. Source of air is suspected to be the retroperitoneal portion of the ascending colon, however, a clear point of perforation is not identified. Underlying gastroduodenal ulcer is also possible. There is mild ascending and transverse colitis. These results were called by telephone at the time of interpretation on 08/13/2018 at 2:10 am to Dr. Delora Fuel , who verbally acknowledged these results. Electronically Signed   By: Kristine Garbe M.D.   On: 08/13/2018 02:14    Pending Labs Unresulted Labs (From admission, onward)    Start     Ordered   08/13/18 0600  Basic metabolic panel  Tomorrow morning,   R     08/13/18 0326   08/13/18 0500  CBC  Tomorrow morning,   R     08/13/18 0326   08/13/18 0411  SARS Coronavirus 2 [facility] order, Performed in [place] hospital lab)  (Novel Coronavirus, [facility] Order))  Once,   R      08/13/18 0411   08/13/18 0324  HIV antibody (Routine Testing)  Once,   R     08/13/18 0326          Vitals/Pain Today's Vitals   08/13/18 0130 08/13/18 0152 08/13/18 0230 08/13/18 0243  BP:   113/76   Pulse: 84  64   Resp: 16  (!) 21   Temp:      TempSrc:      SpO2: 99%  99%   PainSc:  8   6     Isolation Precautions No active isolations  Medications Medications  enoxaparin (LOVENOX) injection 40 mg (has no administration in time range)  dextrose 5 %-0.9 % sodium chloride infusion (has no administration in time range)  morphine 2 MG/ML injection 1-2 mg (has no administration in time range)  diphenhydrAMINE (BENADRYL) injection 25 mg (has no administration in time range)  ondansetron (ZOFRAN-ODT) disintegrating tablet 4 mg (has no administration in time range)    Or  ondansetron (ZOFRAN) injection 4 mg (has no administration in time range)  pantoprazole (PROTONIX) injection 40 mg (has no administration in time range)  piperacillin-tazobactam (ZOSYN) IVPB 3.375 g (has no administration in time range)  sodium chloride 0.9 % bolus 1,000 mL (0 mLs Intravenous Stopped 08/13/18 0148)  metoCLOPramide (REGLAN) injection 10 mg (10 mg Intravenous Given 08/13/18 0024)  diphenhydrAMINE (BENADRYL) injection 25 mg (25 mg Intravenous Given 08/13/18 0024)  dicyclomine (BENTYL) capsule 20 mg (20 mg Oral Given 08/13/18 0030)  morphine 4 MG/ML injection 4 mg (4 mg Intravenous Given 08/13/18 0045)  morphine 4 MG/ML injection 4 mg (4 mg Intravenous Given 08/13/18 0200)  prochlorperazine (COMPAZINE) injection 10 mg (10 mg Intravenous Given 08/13/18 0200)  sodium chloride 0.9 % bolus  1,000 mL (0 mLs Intravenous Stopped 08/13/18 0415)  iohexol (OMNIPAQUE) 300 MG/ML solution 100 mL (100 mLs Intravenous Contrast Given 08/13/18 0148)  piperacillin-tazobactam (ZOSYN) IVPB 3.375 g (0 g Intravenous Stopped 08/13/18 0319)  HYDROmorphone (DILAUDID) injection 1 mg (1 mg Intravenous Given 08/13/18 0246)     Mobility walks Low fall risk   Focused Assessments GI   R Recommendations: See Admitting Provider Note  Report given to:   Additional Notes:

## 2018-08-13 NOTE — H&P (Signed)
Samantha Noble is an 33 y.o. female.   Chief Complaint: Abdominal pain HPI: Patient is a 33 year old female with a history of anxiety, IBS.  She does have a history in the past of having been evaluated in the ER for similar abdominal pain complaints.  Patient comes in with a 2-day history of abdominal pain.  Patient states that the pain was fairly generalized but at some point was located in the epigastrium.  Patient states that she developed a pressure-like sensation.  She states that this sensation has been going on for approximately 2 years.  Patient did have some nausea and vomiting however she states it was minimal.  Secondary to ongoing pain she presented to the ER.  Of note the patient states she previously had seen [facility] for IBS.  She was to undergo endoscopy however this was never completed.  She never had a colonoscopy or upper endoscopy.  Patient states at some point in the past she was taken dicyclomine however has not continued this medication.  Patient underwent CT scan which revealed right-sided as well as some partially left-sided retroperitoneal air within the psoas muscles, lateral abdominal walls, and midthoracic epidural space as well as inferior mediastinum.  I did review the CT scan personally.  Patient also had laboratory studies with elevated WBC count at 28.    Past Medical History:  Diagnosis Date  . Acid reflux disease   . Gastritis   . GERD (gastroesophageal reflux disease)   . IBS (irritable bowel syndrome)   . Pancreatitis     Past Surgical History:  Procedure Laterality Date  . NO PAST SURGERIES      History reviewed. No pertinent family history. Social History:  reports that she has been smoking cigarettes. She has a 0.20 pack-year smoking history. She has never used smokeless tobacco. She reports current alcohol use. She reports current drug use. Drug: Marijuana.  Allergies: No Known Allergies  (Not in a hospital admission)   Results for orders  placed or performed during the hospital encounter of 08/12/18 (from the past 48 hour(s))  Comprehensive metabolic panel     Status: Abnormal   Collection Time: 08/13/18 12:04 AM  Result Value Ref Range   Sodium 140 135 - 145 mmol/L   Potassium 4.2 3.5 - 5.1 mmol/L   Chloride 102 98 - 111 mmol/L   CO2 23 22 - 32 mmol/L   Glucose, Bld 205 (H) 70 - 99 mg/dL   BUN 21 (H) 6 - 20 mg/dL   Creatinine, Ser 1.31 (H) 0.44 - 1.00 mg/dL   Calcium 10.4 (H) 8.9 - 10.3 mg/dL   Total Protein 8.9 (H) 6.5 - 8.1 g/dL   Albumin 5.4 (H) 3.5 - 5.0 g/dL   AST 27 15 - 41 U/L   ALT 24 0 - 44 U/L   Alkaline Phosphatase 48 38 - 126 U/L   Total Bilirubin 1.3 (H) 0.3 - 1.2 mg/dL   GFR calc non Af Amer 54 (L) >60 mL/min   GFR calc Af Amer >60 >60 mL/min   Anion gap 15 5 - 15    Comment: Performed at [facility], [address] [place], [place] [address]  Lipase, blood     Status: None   Collection Time: 08/13/18 12:04 AM  Result Value Ref Range   Lipase 24 11 - 51 U/L    Comment: Performed at [facility] [address], [place], [place] [address]  CBC with Differential  Status: Abnormal   Collection Time: 08/13/18 12:04 AM  Result Value Ref Range   WBC 28.0 (H) 4.0 - 10.5 K/uL   RBC 4.92 3.87 - 5.11 MIL/uL   Hemoglobin 15.2 (H) 12.0 - 15.0 g/dL   HCT 45.0 36.0 - 46.0 %   MCV 91.5 80.0 - 100.0 fL   MCH 30.9 26.0 - 34.0 pg   MCHC 33.8 30.0 - 36.0 g/dL   RDW 12.9 11.5 - 15.5 %   Platelets 327 150 - 400 K/uL   nRBC 0.0 0.0 - 0.2 %   Neutrophils Relative % 93 %   Neutro Abs 26.2 (H) 1.7 - 7.7 K/uL   Lymphocytes Relative 4 %   Lymphs Abs 1.1 0.7 - 4.0 K/uL   Monocytes Relative 2 %   Monocytes Absolute 0.5 0.1 - 1.0 K/uL   Eosinophils Relative 0 %   Eosinophils Absolute 0.0 0.0 - 0.5 K/uL   Basophils Relative 0 %   Basophils Absolute 0.0 0.0 - 0.1 K/uL   Immature Granulocytes 1 %   Abs Immature Granulocytes 0.15 (H) 0.00 - 0.07 K/uL    Comment: Performed at [facility],  [address], [place], [place] [address]  Urinalysis, Routine w reflex microscopic     Status: Abnormal   Collection Time: 08/13/18 12:10 AM  Result Value Ref Range   Color, Urine AMBER (A) YELLOW    Comment: BIOCHEMICALS MAY BE AFFECTED BY COLOR   APPearance HAZY (A) CLEAR   Specific Gravity, Urine >1.046 (H) 1.005 - 1.030   pH 5.0 5.0 - 8.0   Glucose, UA NEGATIVE NEGATIVE mg/dL   Hgb urine dipstick NEGATIVE NEGATIVE   Bilirubin Urine NEGATIVE NEGATIVE   Ketones, ur 5 (A) NEGATIVE mg/dL   Protein, ur >=300 (A) NEGATIVE mg/dL   Nitrite NEGATIVE NEGATIVE   Leukocytes,Ua NEGATIVE NEGATIVE   RBC / HPF 0-5 0 - 5 RBC/hpf   WBC, UA 0-5 0 - 5 WBC/hpf   Bacteria, UA NONE SEEN NONE SEEN   Squamous Epithelial / LPF 0-5 0 - 5   Mucus PRESENT    Hyaline Casts, UA PRESENT     Comment: Performed at [facility], [place] [address], [place], [place] [address]  POC urine preg, ED     Status: None   Collection Time: 08/13/18 12:19 AM  Result Value Ref Range   Preg Test, Ur NEGATIVE NEGATIVE    Comment:        THE SENSITIVITY OF THIS METHODOLOGY IS >24 mIU/mL   Group A Strep by PCR     Status: None   Collection Time: 08/13/18 12:34 AM  Result Value Ref Range   Group A Strep by PCR NOT DETECTED NOT DETECTED    Comment: Performed at [facility], [place] [address] [place], [place] [address]   Ct Abdomen Pelvis W Contrast  Result Date: 08/13/2018 CLINICAL DATA:  33 y/o F; left abdominal and flank pain. Runny nose, sore throat, headache, and body aches for 3 days. EXAM: CT ABDOMEN AND PELVIS WITH CONTRAST TECHNIQUE: Multidetector CT imaging of the abdomen and pelvis was performed using the standard protocol following bolus administration of intravenous contrast. CONTRAST:  152mL OMNIPAQUE IOHEXOL 300 MG/ML  SOLN COMPARISON:  02/23/2014 CT abdomen and pelvis. FINDINGS: Lower chest: Clear lung bases. Hepatobiliary: No focal liver abnormality is seen. No gallstones, gallbladder wall thickening, or  biliary dilatation. Pancreas: Unremarkable. No pancreatic ductal dilatation or surrounding inflammatory changes. Spleen: Normal in size without  focal abnormality. Adrenals/Urinary Tract: Adrenal glands are unremarkable. Kidneys are normal, without renal calculi, focal lesion, or hydronephrosis. Bladder is unremarkable. Stomach/Bowel: There is wall thickening of the ascending and transverse colon with mucosal enhancement. Normal appendix. Small bowel and stomach are unremarkable. Vascular/Lymphatic: No significant vascular findings are present. No enlarged abdominal or pelvic lymph nodes. Reproductive: Uterus and bilateral adnexa are unremarkable. Other: There is free air within the right-sided retroperitoneum, right posterior abdominal wall, extraperitoneal space surrounding right lobe of liver, midthoracic epidural space, and inferior mediastinum. Musculoskeletal: No fracture is seen. IMPRESSION: Free air within the right retroperitoneum along psoas, right posterior abdominal wall, extraperitoneal space surrounding right lobe of liver, midthoracic epidural space, and inferior mediastinum. Source of air is suspected to be the retroperitoneal portion of the ascending colon, however, a clear point of perforation is not identified. Underlying gastroduodenal ulcer is also possible. There is mild ascending and transverse colitis. These results were called by telephone at the time of interpretation on 08/13/2018 at 2:10 am to Dr. Delora Fuel , who verbally acknowledged these results. Electronically Signed   By: Kristine Garbe M.D.   On: 08/13/2018 02:14    Review of Systems  Constitutional: Negative for chills, fever and malaise/fatigue.  HENT: Negative for ear discharge, hearing loss and sore throat.   Eyes: Negative for blurred vision and discharge.  Respiratory: Negative for cough and shortness of breath.   Cardiovascular: Negative for chest pain, orthopnea and leg swelling.  Gastrointestinal: Positive  for abdominal pain, constipation, nausea and vomiting. Negative for diarrhea and heartburn.  Musculoskeletal: Negative for myalgias and neck pain.  Skin: Negative for itching and rash.  Neurological: Negative for dizziness, focal weakness, seizures and loss of consciousness.  Endo/Heme/Allergies: Negative for environmental allergies. Does not bruise/bleed easily.  Psychiatric/Behavioral: Negative for depression and suicidal ideas.  All other systems reviewed and are negative.   Blood pressure 113/76, pulse 64, temperature 97.9 F (36.6 C), temperature source Oral, resp. rate (!) 21, last menstrual period 08/10/2018, SpO2 99 %. Physical Exam  Constitutional: She is oriented to person, place, and time. Vital signs are normal. She appears well-developed and well-nourished.  Conversant No acute distress  HENT:  Head: Normocephalic and atraumatic.  Eyes: Lids are normal. No scleral icterus.  No lid lag Moist conjunctiva  Neck: Normal range of motion. Neck supple. No tracheal tenderness present. No thyromegaly present.  No cervical lymphadenopathy  Cardiovascular: Normal rate, regular rhythm and intact distal pulses.  No murmur heard. Respiratory: Effort normal and breath sounds normal. She has no wheezes. She has no rales.  GI: Soft. Bowel sounds are normal. She exhibits no distension and no mass. There is no hepatosplenomegaly. There is abdominal tenderness (min LUQ/Epigastric abd pain). There is no rebound and no guarding. No hernia.  Musculoskeletal: Normal range of motion.  Neurological: She is alert and oriented to person, place, and time.  Normal gait and station  Skin: Skin is warm. No rash noted. No cyanosis. Nails show no clubbing.  Normal skin turgor  Psychiatric: Judgment normal.  Appropriate affect     Assessment/Plan 33 year old female with retroperitoneal free air Leukocytosis  1.  We will admit the patient for observation.  It is difficult to assess where the  perforation is coming from as per the CT scan.  Patient is very unusual CT scan that does not correlate clinically as patient has no abdominal pain on exam. 2.  We will keep n.p.o. 3.  Continue Zosyn 4.  Will likely require GI  consult for further evaluation, possible upper endoscopy.  Ralene Ok, MD 08/13/2018, 3:15 AM

## 2018-08-14 ENCOUNTER — Inpatient Hospital Stay (HOSPITAL_COMMUNITY): Payer: 59

## 2018-08-14 DIAGNOSIS — R109 Unspecified abdominal pain: Secondary | ICD-10-CM

## 2018-08-14 DIAGNOSIS — K668 Other specified disorders of peritoneum: Secondary | ICD-10-CM

## 2018-08-14 DIAGNOSIS — R1084 Generalized abdominal pain: Secondary | ICD-10-CM

## 2018-08-14 LAB — CBC
HCT: 35.5 % — ABNORMAL LOW (ref 36.0–46.0)
Hemoglobin: 11.7 g/dL — ABNORMAL LOW (ref 12.0–15.0)
MCH: 30.5 pg (ref 26.0–34.0)
MCHC: 33 g/dL (ref 30.0–36.0)
MCV: 92.4 fL (ref 80.0–100.0)
Platelets: 205 10*3/uL (ref 150–400)
RBC: 3.84 MIL/uL — ABNORMAL LOW (ref 3.87–5.11)
RDW: 12.9 % (ref 11.5–15.5)
WBC: 16.4 10*3/uL — ABNORMAL HIGH (ref 4.0–10.5)
nRBC: 0 % (ref 0.0–0.2)

## 2018-08-14 LAB — BASIC METABOLIC PANEL
Anion gap: 9 (ref 5–15)
BUN: 9 mg/dL (ref 6–20)
CO2: 25 mmol/L (ref 22–32)
Calcium: 8.9 mg/dL (ref 8.9–10.3)
Chloride: 108 mmol/L (ref 98–111)
Creatinine, Ser: 0.89 mg/dL (ref 0.44–1.00)
GFR calc Af Amer: >60 mL/min (ref >60)
GFR calc non Af Amer: >60 mL/min (ref >60)
Glucose, Bld: 115 mg/dL — ABNORMAL HIGH (ref 70–99)
Potassium: 3.6 mmol/L (ref 3.5–5.1)
Sodium: 142 mmol/L (ref 135–145)

## 2018-08-14 MED ORDER — IOHEXOL 300 MG/ML  SOLN
100.0000 mL | Freq: Once | INTRAMUSCULAR | Status: AC | PRN
  Administered 2018-08-14: 100 mL via ORAL

## 2018-08-14 MED ORDER — PANTOPRAZOLE SODIUM 40 MG IV SOLR
40.0000 mg | Freq: Two times a day (BID) | INTRAVENOUS | Status: DC
  Administered 2018-08-14 – 2018-08-16 (×5): 40 mg via INTRAVENOUS
  Filled 2018-08-14 (×5): qty 40

## 2018-08-14 MED ORDER — SODIUM CHLORIDE 0.9 % IV SOLN
2.0000 g | INTRAVENOUS | Status: DC
  Administered 2018-08-14 – 2018-08-16 (×3): 2 g via INTRAVENOUS
  Filled 2018-08-14 (×5): qty 20

## 2018-08-14 MED ORDER — METRONIDAZOLE IN NACL 5-0.79 MG/ML-% IV SOLN
500.0000 mg | Freq: Three times a day (TID) | INTRAVENOUS | Status: DC
  Administered 2018-08-14 – 2018-08-16 (×7): 500 mg via INTRAVENOUS
  Filled 2018-08-14 (×7): qty 100

## 2018-08-14 NOTE — Consult Note (Signed)
Consultation  Referring Provider: [facility]/Dr. Cornett Primary Care Physician:  Patient, No Pcp Per Primary Gastroenterologist:   None/unassigned.  Reason for Consultation:  Free air retroperitoneum  HPI: Samantha Noble is a 33 y.o. female, generally in good health with history of IBS and GERD who was admitted through the emergency room yesterday after presenting with a 2-day history of severe crampy abdominal pain.  Patient says that she has been having increased IBS symptoms over the past couple of weeks with intermittent lower abdominal discomfort spasms and cramping and intermittent episodes of diarrhea She says in the past she had alternated between constipation and diarrhea. The pain worsened acutely on Tuesday she describes it as a pulling pressure type of sensation that she felt in her mid abdomen persisted and became stabbing in nature.  She apparently did have one episode of nausea and vomiting. No fever or chills at home, no hematochezia. Evaluation in the emergency room with CT of the abdomen and pelvis showed free air in the right retroperitoneum along the psoas, right posterior abdominal wall extraperitoneal space surrounding the right lobe of the liver thoracic epidural space and inferior mediastinum.  Source of the free air was suspected to be retroperitoneal portion of the a sending colon no clear point of perforation identified. There was mild ascending and transverse colitis.  Patient underwent upper GI today to rule out duodenal ulcer..  This was a normal study other than noting mildly prominent distal duodenum  On admit WBC 28,000, hemoglobin 15/hematocrit 45, creatinine 1.31 on admit, LFTs normal with exception of T bili 1.3.  Patient has been started on IV metronidazole WBC improved today to 16.4, hemoglobin 11.7.  Patient says she feels much better today than she did on admission.  She is tolerating clear liquids without difficulty and she denies any nausea or vomiting  and she is not having any ongoing abdominal pain but continues to experience intermittent cramping this point more in the left abdomen.  Further discussion with the patient, she had been seen by [facility] in the past for diagnosis of IBS, she did not ever undergo any endoscopic evaluation. Does have family history of Crohn's disease in her paternal uncle. Patient has not had any prior abdominal surgery.     Past Medical History:  Diagnosis Date  . Acid reflux disease   . Gastritis   . GERD (gastroesophageal reflux disease)   . IBS (irritable bowel syndrome)   . Pancreatitis     Past Surgical History:  Procedure Laterality Date  . NO PAST SURGERIES      Prior to Admission medications   Medication Sig Start Date End Date Taking? Authorizing Provider  ALPRAZolam Duanne Moron) 0.5 MG tablet Take 0.25-0.5 mg by mouth every 6 (six) hours as needed for anxiety.  07/27/18  Yes [provider]  eszopiclone (LUNESTA) 2 MG TABS tablet Take 2 mg by mouth at bedtime. 07/06/18  Yes [provider]  dicyclomine (BENTYL) 20 MG tablet Take 1 tablet (20 mg total) by mouth 2 (two) times daily. Patient not taking: Reported on 08/13/2018 10/25/17   Ok Edwards, PA-C  ondansetron (ZOFRAN ODT) 4 MG disintegrating tablet 4mg  ODT q4 hours prn nausea/vomit Patient not taking: Reported on 08/13/2018 02/23/14   [name], MD  ondansetron (ZOFRAN) 4 MG tablet Take 1 tablet (4 mg total) by mouth every 6 (six) hours. Patient not taking: Reported on 08/13/2018 12/28/15   Long, Wonda Olds, MD  oxyCODONE-acetaminophen (PERCOCET) 5-325 MG per tablet  Take 1 tablet by mouth every 6 (six) hours as needed for moderate pain. Patient not taking: Reported on 08/13/2018 10/15/14   Evelina Bucy, MD  polyethylene glycol [facility]) packet Take 17 g by mouth daily. Patient not taking: Reported on 08/13/2018 10/25/17   Ok Edwards, PA-C  promethazine (PHENERGAN) 25 MG suppository Place 1 suppository (25 mg total) rectally every 6  (six) hours as needed for nausea or vomiting. Patient not taking: Reported on 08/13/2018 10/15/14   Evelina Bucy, MD  sucralfate (CARAFATE) 1 G tablet Take 1 tablet (1 g total) by mouth 4 (four) times daily. Patient not taking: Reported on 08/13/2018 06/27/12 08/13/27  Carmin Muskrat, MD    Current Facility-Administered Medications  Medication Dose Route Frequency Provider Last Rate Last Dose  . acetaminophen (TYLENOL) tablet 650 mg  650 mg Oral Q6H PRN Jillyn Ledger, PA-C   650 mg at 08/13/18 1154  . cefTRIAXone (ROCEPHIN) 2 g in sodium chloride 0.9 % 100 mL IVPB  2 g Intravenous Q24H Ileana Roup, MD 200 mL/hr at 08/14/18 0844 2 g at 08/14/18 0844  . dextrose 5 %-0.9 % sodium chloride infusion   Intravenous Continuous Ralene Ok, MD 100 mL/hr at 08/14/18 0739    . diphenhydrAMINE (BENADRYL) injection 25 mg  25 mg Intravenous Q8H PRN Jillyn Ledger, PA-C   25 mg at 08/14/18 0532  . enoxaparin (LOVENOX) injection 40 mg  40 mg Subcutaneous Q24H Ralene Ok, MD   40 mg at 08/14/18 1326  . HYDROmorphone (DILAUDID) injection 1 mg  1 mg Intravenous Q3H PRN Jillyn Ledger, PA-C   1 mg at 08/14/18 0940  . metroNIDAZOLE (FLAGYL) IVPB 500 mg  500 mg Intravenous Q8H Ileana Roup, MD 100 mL/hr at 08/14/18 1327 500 mg at 08/14/18 1327  . ondansetron (ZOFRAN-ODT) disintegrating tablet 4 mg  4 mg Oral Q6H PRN Ralene Ok, MD   4 mg at 08/13/18 1154   Or  . ondansetron [facility]) injection 4 mg  4 mg Intravenous Q6H PRN Ralene Ok, MD   4 mg at 08/14/18 0401  . pantoprazole (PROTONIX) injection 40 mg  40 mg Intravenous Q12H Meuth, Brooke A, PA-C   40 mg at 08/14/18 0854  . promethazine (PHENERGAN) injection 12.5-25 mg  12.5-25 mg Intravenous Q6H PRN Jillyn Ledger, PA-C   25 mg at 08/14/18 1135    Allergies as of 08/12/2018  . (No Known Allergies)    History reviewed. No pertinent family history.  Social History   Socioeconomic History  . Marital status:  Single    Spouse name: Not on file  . Number of children: Not on file  . Years of education: Not on file  . Highest education level: Not on file  Occupational History  . Not on file  Social Needs  . Financial resource strain: Not on file  . Food insecurity:    Worry: Not on file    Inability: Not on file  . Transportation needs:    Medical: Not on file    Non-medical: Not on file  Tobacco Use  . Smoking status: Current Some Day Smoker    Packs/day: 0.10    Years: 2.00    Pack years: 0.20    Types: Cigarettes  . Smokeless tobacco: Never Used  Substance and Sexual Activity  . Alcohol use: Yes    Comment: last drink SAT  . Drug use: Yes    Types: Marijuana  . Sexual activity: Not on file  Lifestyle  .  Physical activity:    Days per week: Not on file    Minutes per session: Not on file  . Stress: Not on file  Relationships  . Social connections:    Talks on phone: Not on file    Gets together: Not on file    Attends religious service: Not on file    Active member of club or organization: Not on file    Attends meetings of clubs or organizations: Not on file    Relationship status: Not on file  . Intimate partner violence:    Fear of current or ex partner: Not on file    Emotionally abused: Not on file    Physically abused: Not on file    Forced sexual activity: Not on file  Other Topics Concern  . Not on file  Social History Narrative  . Not on file    Review of Systems: Pertinent positive and negative review of systems were noted in the above HPI section.  All other review of systems was otherwise negative.  Physical Exam: Vital signs in last 24 hours: Temp:  [97.5 F (36.4 C)-98.6 F (37 C)] 98.3 F (36.8 C) (05/07 1627) Pulse Rate:  [56-98] 74 (05/07 1627) Resp:  [16-18] 16 (05/07 1627) BP: (120-148)/(78-99) 120/94 (05/07 1627) SpO2:  [99 %-100 %] 100 % (05/07 1627) Last BM Date: 08/13/18 General:   Alert,  Well-developed, well-nourished,young AA  female, pleasant and cooperative in NAD Head:  Normocephalic and atraumatic. Eyes:  Sclera clear, no icterus.   Conjunctiva pink. Ears:  Normal auditory acuity. Nose:  No deformity, discharge,  or lesions. Mouth:  No deformity or lesions.   Neck:  Supple; no masses or thyromegaly. Lungs:  Clear throughout to auscultation.   No wheezes, crackles, or rhonchi.  Heart:  Regular rate and rhythm; no murmurs, clicks, rubs,  or gallops. Abdomen:  Soft,nondistended, BS+, no palp mass or HSM, mild tenderness bilat LQ, no guarding or rebound  Rectal:  Deferred  Msk:  Symmetrical without gross deformities. . Pulses:  Normal pulses noted. Extremities:  Without clubbing or edema. Neurologic:  Alert and  oriented x4;  grossly normal neurologically. Skin:  Intact without significant lesions or rashes.. Psych:  Alert and cooperative. Normal mood and affect.  Intake/Output from previous day: No intake/output data recorded. Intake/Output this shift: Total I/O In: 480 [P.O.:480] Out: -   Lab Results: Recent Labs    08/13/18 0004 08/13/18 0537 08/14/18 0328  WBC 28.0* 20.4* 16.4*  HGB 15.2* 13.2 11.7*  HCT 45.0 39.7 35.5*  PLT 327 249 205   BMET Recent Labs    08/13/18 0004 08/13/18 0537 08/14/18 0328  NA 140 138 142  K 4.2 4.3 3.6  CL 102 108 108  CO2 23 21* 25  GLUCOSE 205* 131* 115*  BUN 21* 14 9  CREATININE 1.31* 0.92 0.89  CALCIUM 10.4* 8.9 8.9   LFT Recent Labs    08/13/18 0004  PROT 8.9*  ALBUMIN 5.4*  AST 27  ALT 24  ALKPHOS 48  BILITOT 1.3*   PT/INR No results for input(s): LABPROT, INR in the last 72 hours. Hepatitis Panel No results for input(s): HEPBSAG, HCVAB, HEPAIGM, HEPBIGM in the last 72 hours.    IMPRESSION:  #[age] year old African-American female with history of IBS, who had been experiencing increased symptoms with intermittent abdominal pain/cramping and diarrhea over the past 2 weeks and had abrupt onset of more severe mid abdominal pain on  Tuesday, 08/12/2018 ,with nausea and vomiting  x1.  Patient found to have marked leukocytosis and CT imaging field free air in the retroperitoneum, as well as mild colitis involving the ascending and transverse colon.  Upper GI today ruled out perforated ulcer.  Patient symptomatically has improved, and surprisingly abdominal exam fairly benign. Etiology of the presumed microperforation is not entirely clear though suspect from a sending colon.  With her history concerned that she may have underlying IBD/Crohn's involving the ascending colon.  Plan; Exploratory lap if patient has any worsening of symptoms Would continue metronidazole.  Consider adding course of Cipro, and complete a 10-day course of metronidazole and Cipro Would not pursue endoscopic evaluation currently as would be concerned for high risk of worsening microperforation which hopefully has sealed at this point.  Diet advancement as per surgery.  She will need eventual colonoscopy and endoscopy in several weeks, and this will be scheduled as an outpatient with Dr. Bryan Lemma.  She may be best served by repeat CT imaging in 2 weeks prior to any endoscopic procedure.  Thank you for consult, we will follow along          08/14/2018, 4:46 PM

## 2018-08-14 NOTE — Progress Notes (Signed)
[facility] Progress Note     Subjective: CC-  Patient states that her abdominal pain is less frequent. Denies n/v. Main complaint is thirst. States that she's going to drink toilet water if we don't give her something else to drink.  Itching and facial swelling improved since changing antibiotic. Denies SOB. WBC trending down 16.4, afebrile.  Objective: Vital signs in last 24 hours: Temp:  [97.5 F (36.4 C)-98.6 F (37 C)] 98.6 F (37 C) (05/07 0759) Pulse Rate:  [56-94] 94 (05/07 0759) Resp:  [18] 18 (05/07 0759) BP: (121-148)/(78-90) 148/90 (05/07 0759) SpO2:  [100 %] 100 % (05/07 0759) Last BM Date: 08/12/18  Intake/Output from previous day: No intake/output data recorded. Intake/Output this shift: No intake/output data recorded.  PE: Gen:  Alert, NAD HEENT: EOM's intact, pupils equal and round Heart: RRR Lungs: CTAB, normal effort Abd: Soft, ND, NT, +BS, no HSM Msk: calves soft and nontender without edema Skin: warm and dry, no rashes noted  Lab Results:  Recent Labs    08/13/18 0537 08/14/18 0328  WBC 20.4* 16.4*  HGB 13.2 11.7*  HCT 39.7 35.5*  PLT 249 205   BMET Recent Labs    08/13/18 0537 08/14/18 0328  NA 138 142  K 4.3 3.6  CL 108 108  CO2 21* 25  GLUCOSE 131* 115*  BUN 14 9  CREATININE 0.92 0.89  CALCIUM 8.9 8.9   PT/INR No results for input(s): LABPROT, INR in the last 72 hours. CMP     Component Value Date/Time   NA 142 08/14/2018 0328   K 3.6 08/14/2018 0328   CL 108 08/14/2018 0328   CO2 25 08/14/2018 0328   GLUCOSE 115 (H) 08/14/2018 0328   BUN 9 08/14/2018 0328   CREATININE 0.89 08/14/2018 0328   CALCIUM 8.9 08/14/2018 0328   PROT 8.9 (H) 08/13/2018 0004   ALBUMIN 5.4 (H) 08/13/2018 0004   AST 27 08/13/2018 0004   ALT 24 08/13/2018 0004   ALKPHOS 48 08/13/2018 0004   BILITOT 1.3 (H) 08/13/2018 0004   GFRNONAA >60 08/14/2018 0328   GFRAA >60 08/14/2018 0328   Lipase     Component Value Date/Time    LIPASE 24 08/13/2018 0004       Studies/Results: Ct Abdomen Pelvis W Contrast  Result Date: 08/13/2018 CLINICAL DATA:  33 y/o F; left abdominal and flank pain. Runny nose, sore throat, headache, and body aches for 3 days. EXAM: CT ABDOMEN AND PELVIS WITH CONTRAST TECHNIQUE: Multidetector CT imaging of the abdomen and pelvis was performed using the standard protocol following bolus administration of intravenous contrast. CONTRAST:  157mL OMNIPAQUE IOHEXOL 300 MG/ML  SOLN COMPARISON:  02/23/2014 CT abdomen and pelvis. FINDINGS: Lower chest: Clear lung bases. Hepatobiliary: No focal liver abnormality is seen. No gallstones, gallbladder wall thickening, or biliary dilatation. Pancreas: Unremarkable. No pancreatic ductal dilatation or surrounding inflammatory changes. Spleen: Normal in size without focal abnormality. Adrenals/Urinary Tract: Adrenal glands are unremarkable. Kidneys are normal, without renal calculi, focal lesion, or hydronephrosis. Bladder is unremarkable. Stomach/Bowel: There is wall thickening of the ascending and transverse colon with mucosal enhancement. Normal appendix. Small bowel and stomach are unremarkable. Vascular/Lymphatic: No significant vascular findings are present. No enlarged abdominal or pelvic lymph nodes. Reproductive: Uterus and bilateral adnexa are unremarkable. Other: There is free air within the right-sided retroperitoneum, right posterior abdominal wall, extraperitoneal space surrounding right lobe of liver, midthoracic epidural space, and inferior mediastinum. Musculoskeletal: No fracture is seen. IMPRESSION: Free air within the right  retroperitoneum along psoas, right posterior abdominal wall, extraperitoneal space surrounding right lobe of liver, midthoracic epidural space, and inferior mediastinum. Source of air is suspected to be the retroperitoneal portion of the ascending colon, however, a clear point of perforation is not identified. Underlying gastroduodenal ulcer  is also possible. There is mild ascending and transverse colitis. These results were called by telephone at the time of interpretation on 08/13/2018 at 2:10 am to Dr. Delora Fuel , who verbally acknowledged these results. Electronically Signed   By: Kristine Garbe M.D.   On: 08/13/2018 02:14   Dg Abd Portable 1v  Result Date: 08/13/2018 CLINICAL DATA:  Abdominal pain, LEFT lower abdominal pain for 1 week excruciating over past 48 hours EXAM: PORTABLE ABDOMEN - 1 VIEW COMPARISON:  Portable exam 1505 hours compared to CT abdomen and pelvis 08/13/2018 FINDINGS: Normal bowel gas pattern. No bowel dilatation or bowel wall thickening. Soft tissue gas is identified at the RIGHT lateral abdominal wall and in the RIGHT retroperitoneum. Small amount of gas outlines the lateral margin of the liver compatible with free air as noted on the previous study. No urinary tract calcification. Osseous structures unremarkable. Increased attenuation of the urinary bladder consistent with excreted IV contrast from prior CT. IMPRESSION: Retroperitoneal and RIGHT lateral body wall gas are identified. Small amount of free intraperitoneal air is seen lateral to the RIGHT lobe of the liver. These changes were identified on this CT of 08/13/2018. Nonobstructive bowel gas pattern. Electronically Signed   By: Lavonia Dana M.D.   On: 08/13/2018 16:21    Anti-infectives: Anti-infectives (From admission, onward)   Start     Dose/Rate Route Frequency Ordered Stop   08/14/18 0530  metroNIDAZOLE (FLAGYL) IVPB 500 mg     500 mg 100 mL/hr over 60 Minutes Intravenous Every 8 hours 08/14/18 0517     08/14/18 0530  cefTRIAXone (ROCEPHIN) 2 g in sodium chloride 0.9 % 100 mL IVPB     2 g 200 mL/hr over 30 Minutes Intravenous Every 24 hours 08/14/18 0517     08/13/18 1100  piperacillin-tazobactam (ZOSYN) IVPB 3.375 g  Status:  Discontinued     3.375 g 12.5 mL/hr over 240 Minutes Intravenous Every 8 hours 08/13/18 0332 08/14/18 0517    08/13/18 0215  piperacillin-tazobactam (ZOSYN) IVPB 3.375 g     3.375 g 100 mL/hr over 30 Minutes Intravenous  Once 08/13/18 0214 08/13/18 0319       Assessment/Plan Abdominal pain with retroperitoneal free air - CT scan 5/6 with free air within right retroperitoneum along psoas, right posterior abdominal wall, extraperitoneal space surrounding right lobe of liver, midthoracic epidural space, and inferior mediastinum - 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 NSAID use last week. Continue Protonix  - Will need follow up w/ GI. Previously followed with [facility] (Dr. Oletta Lamas).   FEN - NPO, IVF VTE - Lovenox, SCD, Mobilize  ID - Zosyn 5/6>>5/7, rocephin/flagyl 5/7>> Foley - none  Plan: Abdominal exam benign. WBC trending down, VSS. UGI today. Continue NPO and IV abx for now.    LOS: 1 day    Wellington [place] , [facility] 08/14/2018, 8:32 AM Pager: [phone] Mon-Thurs 7:00 am-4:30 pm Fri 7:00 am -11:30 AM Sat-Sun 7:00 am-11:30 am

## 2018-08-14 NOTE — Progress Notes (Signed)
Pt has been restless and complained of itching throughout the night. She complains that she hasn't had any sleep and that she can't relax. IV Benadryl was given without any relief. IV Dilaudid has been given every 3 hours as needed per the order. Pt has right cheek swelling that started on 5/6 but hasn't worsened in severity. Inspected the pt's skin and no hives or rashes are noted. Pt started complaining of nausea which she feels is a problem because she hasn't slept in three days. Zofran was given and pt didn't have relief so phenergan was given intravenously, diluted in 10 ml NS over 3 minutes. Paged Dr. Deland Pretty regarding the pt's symptoms and complaints. Orders received over the telephone and repeated back verbatum. Dr. Dema Severin wanted the RN to enter the orders.

## 2018-08-14 NOTE — Progress Notes (Signed)
Patient right cheek is swollen and patient states that she think she is having reaction to the antibiotics she is receiving. She also said her neck is bubbling. RN told patient antibiotic was switched but patient said she thinks the new antibiotics making her face swollen. PA called with no answer at this time. Will continue to monitor patient.

## 2018-08-14 NOTE — Plan of Care (Signed)
?  Problem: Clinical Measurements: ?Goal: Ability to maintain clinical measurements within normal limits will improve ?Outcome: Progressing ?Goal: Will remain free from infection ?Outcome: Progressing ?Goal: Diagnostic test results will improve ?Outcome: Progressing ?  ?

## 2018-08-15 ENCOUNTER — Inpatient Hospital Stay (HOSPITAL_COMMUNITY): Payer: 59

## 2018-08-15 DIAGNOSIS — K668 Other specified disorders of peritoneum: Secondary | ICD-10-CM

## 2018-08-15 LAB — CBC
HCT: 35.6 % — ABNORMAL LOW (ref 36.0–46.0)
Hemoglobin: 11.6 g/dL — ABNORMAL LOW (ref 12.0–15.0)
MCH: 30.2 pg (ref 26.0–34.0)
MCHC: 32.6 g/dL (ref 30.0–36.0)
MCV: 92.7 fL (ref 80.0–100.0)
Platelets: 174 10*3/uL (ref 150–400)
RBC: 3.84 MIL/uL — ABNORMAL LOW (ref 3.87–5.11)
RDW: 12.8 % (ref 11.5–15.5)
WBC: 15 10*3/uL — ABNORMAL HIGH (ref 4.0–10.5)
nRBC: 0 % (ref 0.0–0.2)

## 2018-08-15 LAB — BASIC METABOLIC PANEL
Anion gap: 13 (ref 5–15)
BUN: 6 mg/dL (ref 6–20)
CO2: 20 mmol/L — ABNORMAL LOW (ref 22–32)
Calcium: 8.5 mg/dL — ABNORMAL LOW (ref 8.9–10.3)
Chloride: 106 mmol/L (ref 98–111)
Creatinine, Ser: 0.68 mg/dL (ref 0.44–1.00)
GFR calc Af Amer: >60 mL/min (ref >60)
GFR calc non Af Amer: >60 mL/min (ref >60)
Glucose, Bld: 124 mg/dL — ABNORMAL HIGH (ref 70–99)
Potassium: 3.2 mmol/L — ABNORMAL LOW (ref 3.5–5.1)
Sodium: 139 mmol/L (ref 135–145)

## 2018-08-15 MED ORDER — POTASSIUM CHLORIDE CRYS ER 20 MEQ PO TBCR
20.0000 meq | EXTENDED_RELEASE_TABLET | Freq: Three times a day (TID) | ORAL | Status: DC
  Administered 2018-08-15 – 2018-08-16 (×3): 20 meq via ORAL
  Filled 2018-08-15 (×3): qty 1

## 2018-08-15 MED ORDER — IOPAMIDOL (ISOVUE-300) INJECTION 61%
100.0000 mL | Freq: Once | INTRAVENOUS | Status: AC | PRN
  Administered 2018-08-15: 11:00:00 100 mL via INTRAVENOUS

## 2018-08-15 MED ORDER — METOCLOPRAMIDE HCL 5 MG/ML IJ SOLN
10.0000 mg | Freq: Three times a day (TID) | INTRAMUSCULAR | Status: DC | PRN
  Administered 2018-08-15: 02:00:00 10 mg via INTRAVENOUS
  Filled 2018-08-15: qty 2

## 2018-08-15 MED ORDER — HYDROMORPHONE HCL 1 MG/ML IJ SOLN
INTRAMUSCULAR | Status: AC
  Administered 2018-08-15: 1 mg via INTRAVENOUS
  Filled 2018-08-15: qty 2

## 2018-08-15 MED ORDER — HYDROMORPHONE HCL 1 MG/ML IJ SOLN
2.0000 mg | Freq: Once | INTRAMUSCULAR | Status: AC
  Administered 2018-08-15: 2 mg via INTRAMUSCULAR

## 2018-08-15 MED ORDER — CAPSAICIN 0.025 % EX CREA
TOPICAL_CREAM | Freq: Every day | CUTANEOUS | Status: DC
  Administered 2018-08-15 – 2018-08-16 (×2): via TOPICAL
  Filled 2018-08-15: qty 60

## 2018-08-15 MED ORDER — FENTANYL CITRATE (PF) 100 MCG/2ML IJ SOLN
50.0000 ug | INTRAMUSCULAR | Status: DC | PRN
  Administered 2018-08-15 – 2018-08-16 (×3): 50 ug via INTRAVENOUS
  Filled 2018-08-15 (×2): qty 2

## 2018-08-15 MED ORDER — SODIUM CHLORIDE 0.9% FLUSH: 10.0000 mL | INTRAVENOUS | Status: DC | PRN

## 2018-08-15 MED ORDER — DICYCLOMINE HCL 10 MG PO CAPS
10.0000 mg | ORAL_CAPSULE | Freq: Three times a day (TID) | ORAL | Status: DC
  Administered 2018-08-15 – 2018-08-16 (×4): 10 mg via ORAL
  Filled 2018-08-15 (×7): qty 1

## 2018-08-15 MED ORDER — SODIUM CHLORIDE 0.9% FLUSH
10.0000 mL | Freq: Two times a day (BID) | INTRAVENOUS | Status: DC
  Administered 2018-08-15 – 2018-08-16 (×2): 10 mL

## 2018-08-15 MED ORDER — FENTANYL CITRATE (PF) 100 MCG/2ML IJ SOLN
INTRAMUSCULAR | Status: AC
  Administered 2018-08-15: 02:00:00 50 ug via INTRAVENOUS
  Filled 2018-08-15: qty 2

## 2018-08-15 NOTE — Progress Notes (Signed)
Pt went to the bathroom to have a BM and pt crying out in extreme abdominal pain 9/10 with nausea. Dilaudid was given as ordered without any relief. Emesis bag and zofran were given. Thirty minutes later the pt was wretching so promethazine was given as ordered.  Paged the on call doctor with the Trauma service and received orders over the telephone to be entered by the RN. Will continue to monitor the pt.

## 2018-08-15 NOTE — Progress Notes (Addendum)
Patient ID: Samantha Noble, female   DOB: January 15, 1986, 33 y.o.   MRN: [id number]    Progress Note   Subjective   Feels a little better each day - still having intermittent cramping /pain especially before a BM  No fevers WBC down to 15, hgb 11.6  Drinking contrast for repeat CT today    Objective   Vital signs in last 24 hours: Temp:  [98.2 F (36.8 C)-98.7 F (37.1 C)] 98.2 F (36.8 C) (05/08 0747) Pulse Rate:  [63-108] 63 (05/08 0747) Resp:  [16-18] 16 (05/08 0747) BP: (114-185)/(81-120) 124/81 (05/08 0747) SpO2:  [99 %-100 %] 100 % (05/08 0747) Last BM Date: 08/13/18 General: AA female  in NAD Heart:  Regular rate and rhythm; no murmurs Lungs: Respirations even and unlabored, lungs CTA bilaterally Abdomen:  Soft,nondistended., BS present,mild tenderness lower abdomen, no rebound Extremities:  Without edema. Neurologic:  Alert and oriented,  grossly normal neurologically. Psych:  Cooperative. Normal mood and affect.  Intake/Output from previous day: 05/07 0701 - 05/08 0700 In: 1160 [P.O.:960; IV Piggyback:200] Out: -  Intake/Output this shift: Total I/O In: 300 [P.O.:200; I.V.:100] Out: -   Lab Results: Recent Labs    08/13/18 0537 08/14/18 0328 08/15/18 0208  WBC 20.4* 16.4* 15.0*  HGB 13.2 11.7* 11.6*  HCT 39.7 35.5* 35.6*  PLT 249 205 174   BMET Recent Labs    08/13/18 0537 08/14/18 0328 08/15/18 0208  NA 138 142 139  K 4.3 3.6 3.2*  CL 108 108 106  CO2 21* 25 20*  GLUCOSE 131* 115* 124*  BUN 14 9 6   CREATININE 0.92 0.89 0.68  CALCIUM 8.9 8.9 8.5*   LFT Recent Labs    08/13/18 0004  PROT 8.9*  ALBUMIN 5.4*  AST 27  ALT 24  ALKPHOS 48  BILITOT 1.3*   PT/INR No results for input(s): LABPROT, INR in the last 72 hours.  Studies/Results: Dg Abd Portable 1v  Result Date: 08/13/2018 CLINICAL DATA:  Abdominal pain, LEFT lower abdominal pain for 1 week excruciating over past 48 hours EXAM: PORTABLE ABDOMEN - 1 VIEW COMPARISON:  Portable  exam 1505 hours compared to CT abdomen and pelvis 08/13/2018 FINDINGS: Normal bowel gas pattern. No bowel dilatation or bowel wall thickening. Soft tissue gas is identified at the RIGHT lateral abdominal wall and in the RIGHT retroperitoneum. Small amount of gas outlines the lateral margin of the liver compatible with free air as noted on the previous study. No urinary tract calcification. Osseous structures unremarkable. Increased attenuation of the urinary bladder consistent with excreted IV contrast from prior CT. IMPRESSION: Retroperitoneal and RIGHT lateral body wall gas are identified. Small amount of free intraperitoneal air is seen lateral to the RIGHT lobe of the liver. These changes were identified on this CT of 08/13/2018. Nonobstructive bowel gas pattern. Electronically Signed   By: Lavonia Dana M.D.   On: 08/13/2018 16:21   Dg Duanne Limerick W Single Cm (sol Or Thin Ba)  Result Date: 08/14/2018 CLINICAL DATA:  Retroperitoneal air, evaluate for gastroduodenal ulcer EXAM: UPPER GI SERIES WITH KUB TECHNIQUE: After obtaining a scout radiograph a routine upper GI series was performed using thin barium FLUOROSCOPY TIME:  Fluoroscopy Time:  2 minutes 24 seconds Radiation Exposure Index (if provided by the fluoroscopic device): 21.6 mGy Number of Acquired Spot Images: 13 COMPARISON:  CT abdomen/pelvis dated 08/13/2018 FINDINGS: Scout radiograph is within normal limits. Normal appearance of the stomach.  No evidence of gastric ulcer. Normal appearance of the duodenal  bulb/2nd portion of the duodenum. 3rd and 4th portion of the duodenum are mildly prominent/dilated in the central abdomen, favoring adynamic small bowel ileus. Only trace opacification of the 4th portion of the duodenum is evident on the final image after 12 minutes. No extraluminal contrast is evident. IMPRESSION: Normal appearance of the stomach.  No evidence of gastric ulcer. Mildly prominent distal duodenum in the central abdomen, possibly reflecting  adynamic small bowel ileus in the setting of pain medication. Only trace opacification of the 4th portion of the duodenum at the conclusion of the study. No extraluminal contrast is evident. Electronically Signed   By: Julian Hy M.D.   On: 08/14/2018 11:32       Assessment / Plan:     #1 33 yo female admitted with 2-day history of acute abdominal pain, nausea vomiting and leukocytosis.  Found on imaging to have retroperitoneal air and mild right-sided colitis.  Upper GI was done to rule out ulcer disease and negative with no evidence of ulcer or extravasation.  She continues to gradually improve, now on Rocephin and Cipro.  WBC trending down  Etiology of perforation remains unclear but suspected to be from the right colon.  Consider underlying IBD/Crohn's.  She is scheduled for repeat CT today  #2 prior history of IBS, has had worsening symptoms recently also  raising question of underlying IBD  Plan; will follow-up results of CT abdomen and pelvis today Plan a 10-day course of Cipro and Flagyl total No current plan for laparoscopy per surgery,from GI standpoint if CT shows improvement, gradually advance diet, and we will plan to follow-up outpatient pursue further work-up to include EGD and colonoscopy with Dr. Bryan Lemma.  Will add  Bentyl for cramping full on discharge as well.         Active Problems:   Intra-abdominal free air of unknown etiology   Abdominal pain     LOS: 2 days   Samantha Noble  08/15/2018, 9:55 AM

## 2018-08-15 NOTE — Plan of Care (Signed)

## 2018-08-15 NOTE — Progress Notes (Signed)
[facility] Progress Note     Subjective: CC-  Continues to have intermittent abdominal pain. States that it started again last night after having a BM. States that she had multiple loose stools yesterday and then once more formed bowel movement. Pain was LLQ. Some nausea, no emesis. Feeling a little better this morning. States that pain was not worse with PO intake.  Objective: Vital signs in last 24 hours: Temp:  [98.2 F (36.8 C)-98.7 F (37.1 C)] 98.2 F (36.8 C) (05/08 0747) Pulse Rate:  [63-108] 63 (05/08 0747) Resp:  [16-18] 16 (05/08 0747) BP: (114-185)/(81-120) 124/81 (05/08 0747) SpO2:  [99 %-100 %] 100 % (05/08 0747) Last BM Date: 08/13/18  Intake/Output from previous day: 05/07 0701 - 05/08 0700 In: 1160 [P.O.:960; IV Piggyback:200] Out: -  Intake/Output this shift: No intake/output data recorded.  PE: Gen:  Alert, NAD HEENT: EOM's intact, pupils equal and round Heart: RRR Lungs: CTAB, normal effort Abd: Soft, ND, mild LLQ TTP without rebound or guarding, +BS, no HSM Msk: calves soft and nontender without edema Skin: warm and dry, no rashes noted   Lab Results:  Recent Labs    08/14/18 0328 08/15/18 0208  WBC 16.4* 15.0*  HGB 11.7* 11.6*  HCT 35.5* 35.6*  PLT 205 174   BMET Recent Labs    08/14/18 0328 08/15/18 0208  NA 142 139  K 3.6 3.2*  CL 108 106  CO2 25 20*  GLUCOSE 115* 124*  BUN 9 6  CREATININE 0.89 0.68  CALCIUM 8.9 8.5*   PT/INR No results for input(s): LABPROT, INR in the last 72 hours. CMP     Component Value Date/Time   NA 139 08/15/2018 0208   K 3.2 (L) 08/15/2018 0208   CL 106 08/15/2018 0208   CO2 20 (L) 08/15/2018 0208   GLUCOSE 124 (H) 08/15/2018 0208   BUN 6 08/15/2018 0208   CREATININE 0.68 08/15/2018 0208   CALCIUM 8.5 (L) 08/15/2018 0208   PROT 8.9 (H) 08/13/2018 0004   ALBUMIN 5.4 (H) 08/13/2018 0004   AST 27 08/13/2018 0004   ALT 24 08/13/2018 0004   ALKPHOS 48 08/13/2018 0004   BILITOT  1.3 (H) 08/13/2018 0004   GFRNONAA >60 08/15/2018 0208   GFRAA >60 08/15/2018 0208   Lipase     Component Value Date/Time   LIPASE 24 08/13/2018 0004       Studies/Results: Dg Abd Portable 1v  Result Date: 08/13/2018 CLINICAL DATA:  Abdominal pain, LEFT lower abdominal pain for 1 week excruciating over past 48 hours EXAM: PORTABLE ABDOMEN - 1 VIEW COMPARISON:  Portable exam 1505 hours compared to CT abdomen and pelvis 08/13/2018 FINDINGS: Normal bowel gas pattern. No bowel dilatation or bowel wall thickening. Soft tissue gas is identified at the RIGHT lateral abdominal wall and in the RIGHT retroperitoneum. Small amount of gas outlines the lateral margin of the liver compatible with free air as noted on the previous study. No urinary tract calcification. Osseous structures unremarkable. Increased attenuation of the urinary bladder consistent with excreted IV contrast from prior CT. IMPRESSION: Retroperitoneal and RIGHT lateral body wall gas are identified. Small amount of free intraperitoneal air is seen lateral to the RIGHT lobe of the liver. These changes were identified on this CT of 08/13/2018. Nonobstructive bowel gas pattern. Electronically Signed   By: Lavonia Dana M.D.   On: 08/13/2018 16:21   Dg Duanne Limerick W Single Cm (sol Or Thin Ba)  Result Date: 08/14/2018 CLINICAL DATA:  Retroperitoneal  air, evaluate for gastroduodenal ulcer EXAM: UPPER GI SERIES WITH KUB TECHNIQUE: After obtaining a scout radiograph a routine upper GI series was performed using thin barium FLUOROSCOPY TIME:  Fluoroscopy Time:  2 minutes 24 seconds Radiation Exposure Index (if provided by the fluoroscopic device): 21.6 mGy Number of Acquired Spot Images: 13 COMPARISON:  CT abdomen/pelvis dated 08/13/2018 FINDINGS: Scout radiograph is within normal limits. Normal appearance of the stomach.  No evidence of gastric ulcer. Normal appearance of the duodenal bulb/2nd portion of the duodenum. 3rd and 4th portion of the duodenum are  mildly prominent/dilated in the central abdomen, favoring adynamic small bowel ileus. Only trace opacification of the 4th portion of the duodenum is evident on the final image after 12 minutes. No extraluminal contrast is evident. IMPRESSION: Normal appearance of the stomach.  No evidence of gastric ulcer. Mildly prominent distal duodenum in the central abdomen, possibly reflecting adynamic small bowel ileus in the setting of pain medication. Only trace opacification of the 4th portion of the duodenum at the conclusion of the study. No extraluminal contrast is evident. Electronically Signed   By: Julian Hy M.D.   On: 08/14/2018 11:32    Anti-infectives: Anti-infectives (From admission, onward)   Start     Dose/Rate Route Frequency Ordered Stop   08/14/18 0530  metroNIDAZOLE (FLAGYL) IVPB 500 mg     500 mg 100 mL/hr over 60 Minutes Intravenous Every 8 hours 08/14/18 0517     08/14/18 0530  cefTRIAXone (ROCEPHIN) 2 g in sodium chloride 0.9 % 100 mL IVPB     2 g 200 mL/hr over 30 Minutes Intravenous Every 24 hours 08/14/18 0517     08/13/18 1100  piperacillin-tazobactam (ZOSYN) IVPB 3.375 g  Status:  Discontinued     3.375 g 12.5 mL/hr over 240 Minutes Intravenous Every 8 hours 08/13/18 0332 08/14/18 0517   08/13/18 0215  piperacillin-tazobactam (ZOSYN) IVPB 3.375 g     3.375 g 100 mL/hr over 30 Minutes Intravenous  Once 08/13/18 0214 08/13/18 0319       Assessment/Plan Abdominal painwith retroperitoneal free air - CT scan 5/6 with free air within right retroperitoneumalong psoas, right posterior abdominal wall, extraperitoneal space surrounding right lobe of liver, midthoracic epidural space, and inferior mediastinum - 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 NSAID use last week. Continue Protonix  - UGI 5/7 Normal appearance of the stomach, no evidence of gastric ulcer - appreciate GI consult, planning outpatient EGD and colonoscopy in 6-8 weeks  FEN -IVF, reg diet VTE -Lovenox,  SCD, Mobilize ID -Zosyn 5/6>>5/7, rocephin/flagyl 5/7>> Foley - none  Plan: Repeat CT scan today. Continue abx.  Trial capsaicin cream for possible component of hyperemesis cannabinoid syndrome.   LOS: 2 days    Wellington Hampshire , [facility] 08/15/2018, 8:05 AM Pager: [phone] Mon-Thurs 7:00 am-4:30 pm Fri 7:00 am -11:30 AM Sat-Sun 7:00 am-11:30 am

## 2018-08-16 LAB — BASIC METABOLIC PANEL
Anion gap: 10 (ref 5–15)
BUN: <5 mg/dL — ABNORMAL LOW (ref 6–20)
CO2: 27 mmol/L (ref 22–32)
Calcium: 8.5 mg/dL — ABNORMAL LOW (ref 8.9–10.3)
Chloride: 103 mmol/L (ref 98–111)
Creatinine, Ser: 0.74 mg/dL (ref 0.44–1.00)
GFR calc Af Amer: >60 mL/min (ref >60)
GFR calc non Af Amer: >60 mL/min (ref >60)
Glucose, Bld: 140 mg/dL — ABNORMAL HIGH (ref 70–99)
Potassium: 3.5 mmol/L (ref 3.5–5.1)
Sodium: 140 mmol/L (ref 135–145)

## 2018-08-16 MED ORDER — CAPSAICIN 0.025 % EX CREA: TOPICAL_CREAM | Freq: Every day | CUTANEOUS | 0 refills | Status: DC

## 2018-08-16 MED ORDER — OXYCODONE HCL 5 MG PO TABS: 5.0000 mg | ORAL_TABLET | Freq: Four times a day (QID) | ORAL | 0 refills | Status: DC | PRN

## 2018-08-16 MED ORDER — PANTOPRAZOLE SODIUM 20 MG PO TBEC: 20.0000 mg | DELAYED_RELEASE_TABLET | Freq: Every day | ORAL | 1 refills | Status: DC

## 2018-08-16 MED ORDER — ONDANSETRON 4 MG PO TBDP: 4.0000 mg | ORAL_TABLET | Freq: Four times a day (QID) | ORAL | 0 refills | Status: DC | PRN

## 2018-08-16 NOTE — Discharge Summary (Signed)
Physician Discharge Summary  Patient ID: Samantha Noble MRN: [id number] DOB/AGE: 31-10-1985 31 y.o.  Admit date: 08/12/2018 Discharge date: 08/16/2018  Admission Diagnoses: Abdominal pain  Discharge Diagnoses:  Active Problems:   Intra-abdominal free air of unknown etiology   Abdominal pain   Retroperitoneal air   Discharged Condition: good  Hospital Course: Patient admitted on 08/12/2018 due to abdominal pain and retroperitoneum free air noted by CT scanning.  She underwent extensive work-up with upper GI which was normal and GI consultation.  Her condition improved over the next 3 days.  Follow-up CT scan showed almost complete resolution of the retroperitoneal air.  She had some abdominal pain and nausea but this resolved with medication.  By hospital day 4 she was pain-free, tolerating her diet and had stable vital signs and a normal physical examination.  She desired discharge and follow-up was arranged with gastroenterology for further work-up as an outpatient which would include upper and lower endoscopy.  This more likely represented an episode of severe retching causing retroperitoneal air creation without significant GI tract injury.  This may be secondary to her marijuana use and she was counseled about this.  Consults: GI  Significant Diagnostic Studies: radiology: CT scan: Showed free retroperitoneal air which resolved on subsequent examination 3 days later and Normal upper GI  Treatments: IV hydration and antibiotics: Zosyn  Discharge Exam: Blood pressure 119/88, pulse 63, temperature 98.3 F (36.8 C), temperature source Oral, resp. rate 16, last menstrual period 08/10/2018, SpO2 100 %. General appearance: alert and cooperative Head: Normocephalic, without obvious abnormality, atraumatic Resp: clear to auscultation bilaterally Cardio: regular rate and rhythm, S1, S2 normal, no murmur, click, rub or gallop GI: soft, non-tender; bowel sounds normal; no masses,  no  organomegaly  Disposition: Discharge disposition: 01-Home or Self Care       Discharge Instructions    Diet - low sodium heart healthy   Complete by:  As directed    Increase activity slowly   Complete by:  As directed      Allergies as of 08/16/2018   No Known Allergies     Medication List    STOP taking these medications   ondansetron 4 MG tablet Commonly known as:  ZOFRAN   oxyCODONE-acetaminophen 5-325 MG tablet Commonly known as:  Percocet     TAKE these medications   ALPRAZolam 0.5 MG tablet Commonly known as:  XANAX Take 0.25-0.5 mg by mouth every 6 (six) hours as needed for anxiety.   capsaicin 0.025 % cream Commonly known as:  ZOSTRIX Apply topically daily.   dicyclomine 20 MG tablet Commonly known as:  BENTYL Take 1 tablet (20 mg total) by mouth 2 (two) times daily.   eszopiclone 2 MG Tabs tablet Commonly known as:  LUNESTA Take 2 mg by mouth at bedtime.   ondansetron 4 MG disintegrating tablet Commonly known as:  ZOFRAN-ODT Take 1 tablet (4 mg total) by mouth every 6 (six) hours as needed for nausea. What changed:    how much to take  how to take this  when to take this  reasons to take this  additional instructions   oxyCODONE 5 MG immediate release tablet Commonly known as:  Oxy IR/ROXICODONE Take 1 tablet (5 mg total) by mouth every 6 (six) hours as needed for severe pain.   pantoprazole 20 MG tablet Commonly known as:  Protonix Take 1 tablet (20 mg total) by mouth daily.   polyethylene glycol 17 g packet Commonly known as:  MiraLax Take  17 g by mouth daily.   promethazine 25 MG suppository Commonly known as:  PHENERGAN Place 1 suppository (25 mg total) rectally every 6 (six) hours as needed for nausea or vomiting.   sucralfate 1 g tablet Commonly known as:  Carafate Take 1 tablet (1 g total) by mouth 4 (four) times daily.      Follow-up Information    Cirigliano, Vito V, DO. Call in 3 week(s).   Specialty:   Gastroenterology Contact information: [address] [place] [address] [phone]           Signed: Turner Daniels 08/16/2018, 9:39 AM

## 2018-08-16 NOTE — Discharge Instructions (Signed)
Follow up with GI medicine for further work up Return if pain worsens  Ok to drive and resume preop diet

## 2018-08-16 NOTE — Progress Notes (Signed)
Subjective/Chief Complaint: Abdominal pain Patient feels well this morning.  She had a good night last night.  She denies any abdominal pain.  Her repeat CT scan shows almost complete resolution of the retroperitoneal air.  She is eating solid food and had a bowel movement with no nausea vomiting or abdominal pain or back pain.  She wishes to go home.   Objective: Vital signs in last 24 hours: Temp:  [97.7 F (36.5 C)-98.7 F (37.1 C)] 98.3 F (36.8 C) (05/09 0859) Pulse Rate:  [63-82] 63 (05/09 0859) Resp:  [16-18] 16 (05/09 0859) BP: (110-146)/(80-95) 119/88 (05/09 0859) SpO2:  [98 %-100 %] 100 % (05/09 0859) Last BM Date: 08/15/18  Intake/Output from previous day: 05/08 0701 - 05/09 0700 In: 2779.4 [P.O.:600; I.V.:1679.4; IV Piggyback:500] Out: 1 [Stool:1] Intake/Output this shift: No intake/output data recorded.  General appearance: alert and cooperative Resp: clear to auscultation bilaterally Cardio: regular rate and rhythm, S1, S2 normal, no murmur, click, rub or gallop GI: soft, non-tender; bowel sounds normal; no masses,  no organomegaly  Lab Results:  Recent Labs    08/14/18 0328 08/15/18 0208  WBC 16.4* 15.0*  HGB 11.7* 11.6*  HCT 35.5* 35.6*  PLT 205 174   BMET Recent Labs    08/15/18 0208 08/16/18 0325  NA 139 140  K 3.2* 3.5  CL 106 103  CO2 20* 27  GLUCOSE 124* 140*  BUN 6 <5*  CREATININE 0.68 0.74  CALCIUM 8.5* 8.5*   PT/INR No results for input(s): LABPROT, INR in the last 72 hours. ABG No results for input(s): PHART, HCO3 in the last 72 hours.  Invalid input(s): PCO2, PO2  Studies/Results: Ct Abdomen Pelvis W Contrast  Result Date: 08/15/2018 CLINICAL DATA:  Acute abdominal pain. Possible constipation. Retroperitoneal air/gas seen 2 days ago. EXAM: CT ABDOMEN AND PELVIS WITH CONTRAST TECHNIQUE: Multidetector CT imaging of the abdomen and pelvis was performed using the standard protocol following bolus administration of intravenous  contrast. CONTRAST:  166mL ISOVUE-300 IOPAMIDOL (ISOVUE-300) INJECTION 61% COMPARISON:  08/13/2018 FINDINGS: Lower chest: Air/gas in the mediastinum and within the right chest wall both anterior and posterior. This appears to be diminishing in amount. No convincing pneumothorax. Lung bases are clear. No pleural fluid. Hepatobiliary: Heterogeneous appearance of the liver. Contrast not seen in the hepatic veins, secondary to scan timing. No focal liver parenchymal lesion. No calcified gallstones. Pancreas: Normal Spleen: Normal Adrenals/Urinary Tract: Adrenal glands are normal. Kidneys are normal. No cyst, mass, stone or hydronephrosis. Bladder is normal. Stomach/Bowel: No bowel pathology is seen. No evidence of obstruction, or inflammatory change. Air/gas in the right lower chest and abdominal wall and within the retroperitoneum. No retroperitoneal bowel pathology is seen. This appears to be diminishing in amount. Vascular/Lymphatic: Otherwise normal. Reproductive: Normal Other: No free fluid or air. Musculoskeletal: Normal IMPRESSION: Diminishing pneumomediastinum, retroperitoneal air and chest and abdominal wall air. I think most likely source of this is in the chest related to the airway. In any case, no intraabdominal pathology is visible today otherwise. Patient does not have constipation or any other sign of bowel obstruction or enteritis. Electronically Signed   By: Nelson Chimes M.D.   On: 08/15/2018 11:31   Dg Duanne Limerick W Single Cm (sol Or Thin Ba)  Result Date: 08/14/2018 CLINICAL DATA:  Retroperitoneal air, evaluate for gastroduodenal ulcer EXAM: UPPER GI SERIES WITH KUB TECHNIQUE: After obtaining a scout radiograph a routine upper GI series was performed using thin barium FLUOROSCOPY TIME:  Fluoroscopy Time:  2  minutes 24 seconds Radiation Exposure Index (if provided by the fluoroscopic device): 21.6 mGy Number of Acquired Spot Images: 13 COMPARISON:  CT abdomen/pelvis dated 08/13/2018 FINDINGS: Scout  radiograph is within normal limits. Normal appearance of the stomach.  No evidence of gastric ulcer. Normal appearance of the duodenal bulb/2nd portion of the duodenum. 3rd and 4th portion of the duodenum are mildly prominent/dilated in the central abdomen, favoring adynamic small bowel ileus. Only trace opacification of the 4th portion of the duodenum is evident on the final image after 12 minutes. No extraluminal contrast is evident. IMPRESSION: Normal appearance of the stomach.  No evidence of gastric ulcer. Mildly prominent distal duodenum in the central abdomen, possibly reflecting adynamic small bowel ileus in the setting of pain medication. Only trace opacification of the 4th portion of the duodenum at the conclusion of the study. No extraluminal contrast is evident. Electronically Signed   By: Julian Hy M.D.   On: 08/14/2018 11:32    Anti-infectives: Anti-infectives (From admission, onward)   Start     Dose/Rate Route Frequency Ordered Stop   08/14/18 0530  metroNIDAZOLE (FLAGYL) IVPB 500 mg     500 mg 100 mL/hr over 60 Minutes Intravenous Every 8 hours 08/14/18 0517 08/24/18 0529   08/14/18 0530  cefTRIAXone (ROCEPHIN) 2 g in sodium chloride 0.9 % 100 mL IVPB     2 g 200 mL/hr over 30 Minutes Intravenous Every 24 hours 08/14/18 0517 08/24/18 0529   08/13/18 1100  piperacillin-tazobactam (ZOSYN) IVPB 3.375 g  Status:  Discontinued     3.375 g 12.5 mL/hr over 240 Minutes Intravenous Every 8 hours 08/13/18 0332 08/14/18 0517   08/13/18 0215  piperacillin-tazobactam (ZOSYN) IVPB 3.375 g     3.375 g 100 mL/hr over 30 Minutes Intravenous  Once 08/13/18 0214 08/13/18 0319      Assessment/Plan: Abdominal painwith retroperitoneal free air - CT scan5/6with free air within right retroperitoneumalong psoas, right posterior abdominal wall, extraperitoneal space surrounding right lobe of liver, midthoracic epidural space, and inferior mediastinum - 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  NSAID use last week. Continue Protonix  - UGI 5/7 Normal appearance of the stomach, no evidence of gastric ulcer - appreciate GI consult, planning outpatient EGD and colonoscopy in 6-8 weeks -Follow-up CT scan shows significantly diminished retroperitoneal air.  No free fluid or signs of inflammation. FEN -IVF, reg diet VTE -Lovenox, SCD, Mobilize ID -Zosyn5/6>>5/7, rocephin/flagyl 5/7>> Foley - none  Plan:  Patient is stable for discharge.  She has a benign abdominal examination is tolerated her diet without difficulty.  She will need follow-up with GI medicine in 6 weeks for further work-up. Trial capsaicin cream for possible component of hyperemesis cannabinoid syndrome   LOS: 3 days    Marcello Moores A Roizy Harold 08/16/2018

## 2018-08-16 NOTE — Progress Notes (Signed)
Pt having 8/10 pain in lower abdominal region. States it started after going to bathroom. She realized she started her menstrual cycle. States it feels like her period cramps. States she normally takes birth control but has left it at home. She says she feels OK otherwise. PRN pain medication given.

## 2018-08-16 NOTE — Progress Notes (Signed)
Pt is extremely agitated, writhing in pain rated 10/10 and screaming that the Fentanyl she received didn't help at all. She is demanding more pain meds and continuously wretching without emesis. The pt continues to get up and down to the bathroom, twist in the bed and push herself up with her arms while sitting up and hold her breath through the pain. The pt accidentally displaced her peripheral IV that was in her hand. Pt requested a shower so RN assisted the pt into the shower. Paged Dr. Ok Anis, on call for trauma service. Order received for an additional one time dose  2 mg of Dilaudid and able to repeat in 3 hours if needed. Dr. Redmond Pulling said the Dilaudid could be given either IV or IM. 1 mg of Dilaudid was given in the right ventrogluteal site and 1 mg of Dilaudid was given in the left ventrogluteal site. RN continues to monitor LOC, BP, RR and SpO2 frequently.

## 2018-08-17 ENCOUNTER — Emergency Department (HOSPITAL_COMMUNITY): Payer: 59

## 2018-08-17 ENCOUNTER — Encounter (HOSPITAL_COMMUNITY): Payer: Self-pay

## 2018-08-17 ENCOUNTER — Observation Stay (HOSPITAL_COMMUNITY)
Admission: EM | Admit: 2018-08-17 | Discharge: 2018-08-19 | Disposition: A | Discharge status: Home / Self Care | Payer: 59 | Attending: Internal Medicine | Admitting: Internal Medicine

## 2018-08-17 DIAGNOSIS — K580 Irritable bowel syndrome with diarrhea: Secondary | ICD-10-CM | POA: Diagnosis not present

## 2018-08-17 DIAGNOSIS — Z79899 Other long term (current) drug therapy: Secondary | ICD-10-CM | POA: Insufficient documentation

## 2018-08-17 DIAGNOSIS — J982 Interstitial emphysema: Principal | ICD-10-CM | POA: Insufficient documentation

## 2018-08-17 DIAGNOSIS — K219 Gastro-esophageal reflux disease without esophagitis: Secondary | ICD-10-CM | POA: Diagnosis not present

## 2018-08-17 DIAGNOSIS — F419 Anxiety disorder, unspecified: Secondary | ICD-10-CM | POA: Insufficient documentation

## 2018-08-17 DIAGNOSIS — E876 Hypokalemia: Secondary | ICD-10-CM | POA: Insufficient documentation

## 2018-08-17 DIAGNOSIS — Z1159 Encounter for screening for other viral diseases: Secondary | ICD-10-CM | POA: Diagnosis not present

## 2018-08-17 DIAGNOSIS — F17210 Nicotine dependence, cigarettes, uncomplicated: Secondary | ICD-10-CM | POA: Insufficient documentation

## 2018-08-17 DIAGNOSIS — E872 Acidosis: Secondary | ICD-10-CM | POA: Diagnosis not present

## 2018-08-17 DIAGNOSIS — F4310 Post-traumatic stress disorder, unspecified: Secondary | ICD-10-CM | POA: Diagnosis not present

## 2018-08-17 LAB — CBC WITH DIFFERENTIAL/PLATELET
Abs Immature Granulocytes: 0.06 10*3/uL (ref 0.00–0.07)
Basophils Absolute: 0 10*3/uL (ref 0.0–0.1)
Basophils Relative: 0 %
Eosinophils Absolute: 0 10*3/uL (ref 0.0–0.5)
Eosinophils Relative: 0 %
HCT: 40 % (ref 36.0–46.0)
Hemoglobin: 13.3 g/dL (ref 12.0–15.0)
Immature Granulocytes: 1 %
Lymphocytes Relative: 9 %
Lymphs Abs: 1.1 10*3/uL (ref 0.7–4.0)
MCH: 30.6 pg (ref 26.0–34.0)
MCHC: 33.3 g/dL (ref 30.0–36.0)
MCV: 92 fL (ref 80.0–100.0)
Monocytes Absolute: 0.3 10*3/uL (ref 0.1–1.0)
Monocytes Relative: 3 %
Neutro Abs: 10 10*3/uL — ABNORMAL HIGH (ref 1.7–7.7)
Neutrophils Relative %: 87 %
Platelets: 244 10*3/uL (ref 150–400)
RBC: 4.35 MIL/uL (ref 3.87–5.11)
RDW: 12.7 % (ref 11.5–15.5)
WBC: 11.4 10*3/uL — ABNORMAL HIGH (ref 4.0–10.5)
nRBC: 0 % (ref 0.0–0.2)

## 2018-08-17 LAB — COMPREHENSIVE METABOLIC PANEL
ALT: 23 U/L (ref 0–44)
AST: 24 U/L (ref 15–41)
Albumin: 4.6 g/dL (ref 3.5–5.0)
Alkaline Phosphatase: 34 U/L — ABNORMAL LOW (ref 38–126)
Anion gap: 14 (ref 5–15)
BUN: 7 mg/dL (ref 6–20)
CO2: 24 mmol/L (ref 22–32)
Calcium: 9.5 mg/dL (ref 8.9–10.3)
Chloride: 105 mmol/L (ref 98–111)
Creatinine, Ser: 0.85 mg/dL (ref 0.44–1.00)
GFR calc Af Amer: >60 mL/min (ref >60)
GFR calc non Af Amer: >60 mL/min (ref >60)
Glucose, Bld: 111 mg/dL — ABNORMAL HIGH (ref 70–99)
Potassium: 3.3 mmol/L — ABNORMAL LOW (ref 3.5–5.1)
Sodium: 143 mmol/L (ref 135–145)
Total Bilirubin: 1.1 mg/dL (ref 0.3–1.2)
Total Protein: 7.4 g/dL (ref 6.5–8.1)

## 2018-08-17 LAB — URINALYSIS, ROUTINE W REFLEX MICROSCOPIC
Bacteria, UA: NONE SEEN
Bilirubin Urine: NEGATIVE
Glucose, UA: NEGATIVE mg/dL
Ketones, ur: 5 mg/dL — AB
Nitrite: NEGATIVE
Protein, ur: NEGATIVE mg/dL
RBC / HPF: >50 RBC/hpf — ABNORMAL HIGH (ref 0–5)
Specific Gravity, Urine: >1.046 — ABNORMAL HIGH (ref 1.005–1.030)
pH: 5 (ref 5.0–8.0)

## 2018-08-17 LAB — I-STAT BETA HCG BLOOD, ED (MC, WL, AP ONLY): I-stat hCG, quantitative: <5 m[IU]/mL (ref <5)

## 2018-08-17 LAB — C-REACTIVE PROTEIN: CRP: <0.8 mg/dL (ref <1.0)

## 2018-08-17 LAB — LACTIC ACID, PLASMA
Lactic Acid, Venous: 2.1 mmol/L (ref 0.5–1.9)
Lactic Acid, Venous: 1.2 mmol/L (ref 0.5–1.9)

## 2018-08-17 LAB — SARS CORONAVIRUS 2 BY RT PCR (HOSPITAL ORDER, PERFORMED IN ~~LOC~~ HOSPITAL LAB): SARS Coronavirus 2: NEGATIVE

## 2018-08-17 LAB — PROCALCITONIN: Procalcitonin: <0.1 ng/mL

## 2018-08-17 LAB — CK: Total CK: 363 U/L — ABNORMAL HIGH (ref 38–234)

## 2018-08-17 LAB — AMYLASE: Amylase: 47 U/L (ref 28–100)

## 2018-08-17 LAB — LIPASE, BLOOD: Lipase: 23 U/L (ref 11–51)

## 2018-08-17 LAB — FERRITIN: Ferritin: 38 ng/mL (ref 11–307)

## 2018-08-17 MED ORDER — METOCLOPRAMIDE HCL 5 MG/ML IJ SOLN
10.0000 mg | Freq: Once | INTRAMUSCULAR | Status: AC
  Administered 2018-08-17: 10 mg via INTRAVENOUS
  Filled 2018-08-17: qty 2

## 2018-08-17 MED ORDER — HYDROMORPHONE HCL 1 MG/ML IJ SOLN
1.0000 mg | Freq: Once | INTRAMUSCULAR | Status: AC
  Administered 2018-08-17: 1 mg via INTRAVENOUS
  Filled 2018-08-17: qty 1

## 2018-08-17 MED ORDER — PANTOPRAZOLE SODIUM 40 MG IV SOLR
40.0000 mg | Freq: Once | INTRAVENOUS | Status: AC
  Administered 2018-08-17: 40 mg via INTRAVENOUS
  Filled 2018-08-17: qty 40

## 2018-08-17 MED ORDER — ALPRAZOLAM 0.5 MG PO TABS
0.5000 mg | ORAL_TABLET | Freq: Once | ORAL | Status: AC
  Administered 2018-08-17: 0.5 mg via ORAL
  Filled 2018-08-17: qty 1

## 2018-08-17 MED ORDER — PROMETHAZINE HCL 25 MG PO TABS: 12.5000 mg | ORAL_TABLET | Freq: Four times a day (QID) | ORAL | Status: DC | PRN

## 2018-08-17 MED ORDER — ONDANSETRON HCL 4 MG/2ML IJ SOLN
4.0000 mg | Freq: Once | INTRAMUSCULAR | Status: AC
  Administered 2018-08-17: 4 mg via INTRAVENOUS
  Filled 2018-08-17: qty 2

## 2018-08-17 MED ORDER — MORPHINE SULFATE (PF) 4 MG/ML IV SOLN
4.0000 mg | Freq: Once | INTRAVENOUS | Status: AC
  Administered 2018-08-17: 4 mg via INTRAVENOUS
  Filled 2018-08-17: qty 1

## 2018-08-17 MED ORDER — SENNOSIDES-DOCUSATE SODIUM 8.6-50 MG PO TABS: 1.0000 | ORAL_TABLET | Freq: Every evening | ORAL | Status: DC | PRN

## 2018-08-17 MED ORDER — ACETAMINOPHEN 325 MG PO TABS
650.0000 mg | ORAL_TABLET | Freq: Four times a day (QID) | ORAL | Status: DC | PRN
  Administered 2018-08-17 – 2018-08-18 (×2): 650 mg via ORAL
  Filled 2018-08-17 (×2): qty 2

## 2018-08-17 MED ORDER — ALPRAZOLAM 0.25 MG PO TABS: 0.5000 mg | ORAL_TABLET | Freq: Every evening | ORAL | Status: DC | PRN

## 2018-08-17 MED ORDER — DIPHENHYDRAMINE HCL 50 MG/ML IJ SOLN
12.5000 mg | Freq: Once | INTRAMUSCULAR | Status: AC
  Administered 2018-08-17: 12.5 mg via INTRAVENOUS
  Filled 2018-08-17: qty 1

## 2018-08-17 MED ORDER — ZOLPIDEM TARTRATE 5 MG PO TABS
5.0000 mg | ORAL_TABLET | Freq: Once | ORAL | Status: AC
  Administered 2018-08-17: 23:00:00 5 mg via ORAL
  Filled 2018-08-17: qty 1

## 2018-08-17 MED ORDER — ENOXAPARIN SODIUM 40 MG/0.4ML ~~LOC~~ SOLN: 40.0000 mg | Freq: Every day | SUBCUTANEOUS | Status: DC

## 2018-08-17 MED ORDER — ALPRAZOLAM 0.25 MG PO TABS: 0.2500 mg | ORAL_TABLET | Freq: Four times a day (QID) | ORAL | Status: DC | PRN

## 2018-08-17 MED ORDER — IOHEXOL 300 MG/ML  SOLN
100.0000 mL | Freq: Once | INTRAMUSCULAR | Status: AC | PRN
  Administered 2018-08-17: 100 mL via INTRAVENOUS

## 2018-08-17 MED ORDER — SODIUM CHLORIDE 0.9 % IV BOLUS
1000.0000 mL | Freq: Once | INTRAVENOUS | Status: AC
  Administered 2018-08-17: 17:00:00 1000 mL via INTRAVENOUS

## 2018-08-17 MED ORDER — ACETAMINOPHEN 650 MG RE SUPP: 650.0000 mg | Freq: Four times a day (QID) | RECTAL | Status: DC | PRN

## 2018-08-17 NOTE — ED Notes (Signed)
Patient transported to CT 

## 2018-08-17 NOTE — ED Notes (Signed)
ED Provider at bedside. 

## 2018-08-17 NOTE — ED Notes (Signed)
Attempted report 

## 2018-08-17 NOTE — ED Notes (Signed)
ED TO INPATIENT HANDOFF REPORT  ED Nurse Name and Phone #: Jinny Blossom [phone]  S Name/Age/Gender Samantha Noble 33 y.o. female Room/Bed: 039C/039C  Code Status   Code Status: Full Code  Home/SNF/Other Home Patient oriented to: self, place, time and situation Is this baseline? Yes   Triage Complete: Triage complete  Chief Complaint abd pain  Triage Note Pt arrives POV for left sided abdominal pain. Pt prescribed medicine at discharge, attempted to take, but threw up. Pt reports was told to return if pain returned/worsened. Pt endorses pain currently 10/10.   Allergies No Known Allergies  Level of Care/Admitting Diagnosis ED Disposition    ED Disposition Condition Comment   Admit  Hospital Area: [facility] [[id number]]  Level of Care: Progressive [102]  Covid Evaluation: N/A  Diagnosis: Pneumomediastinum [facility]) [[id number]]  Admitting Physician: Oda Kilts [[id number]]  Attending Physician: Oda Kilts [phone]  PT Class (Do Not Modify): Observation [104]  PT Acc Code (Do Not Modify): Observation [10022]       B Medical/Surgery History Past Medical History:  Diagnosis Date  . Acid reflux disease   . Gastritis   . GERD (gastroesophageal reflux disease)   . IBS (irritable bowel syndrome)   . Pancreatitis    Past Surgical History:  Procedure Laterality Date  . NO PAST SURGERIES       A IV Location/Drains/Wounds Patient Lines/Drains/Airways Status   Active Line/Drains/Airways    Name:   Placement date:   Placement time:   Site:   Days:   Peripheral IV 08/17/18 Right Antecubital   08/17/18    1832    Antecubital   less than 1          Intake/Output Last 24 hours No intake or output data in the 24 hours ending 08/17/18 2127  Labs/Imaging Results for orders placed or performed during the hospital encounter of 08/17/18 (from the past 48 hour(s))  CBC with Differential     Status: Abnormal   Collection Time: 08/17/18  3:31 PM   Result Value Ref Range   WBC 11.4 (H) 4.0 - 10.5 K/uL   RBC 4.35 3.87 - 5.11 MIL/uL   Hemoglobin 13.3 12.0 - 15.0 g/dL   HCT 40.0 36.0 - 46.0 %   MCV 92.0 80.0 - 100.0 fL   MCH 30.6 26.0 - 34.0 pg   MCHC 33.3 30.0 - 36.0 g/dL   RDW 12.7 11.5 - 15.5 %   Platelets 244 150 - 400 K/uL   nRBC 0.0 0.0 - 0.2 %   Neutrophils Relative % 87 %   Neutro Abs 10.0 (H) 1.7 - 7.7 K/uL   Lymphocytes Relative 9 %   Lymphs Abs 1.1 0.7 - 4.0 K/uL   Monocytes Relative 3 %   Monocytes Absolute 0.3 0.1 - 1.0 K/uL   Eosinophils Relative 0 %   Eosinophils Absolute 0.0 0.0 - 0.5 K/uL   Basophils Relative 0 %   Basophils Absolute 0.0 0.0 - 0.1 K/uL   Immature Granulocytes 1 %   Abs Immature Granulocytes 0.06 0.00 - 0.07 K/uL    Comment: Performed at [facility], [address], [place], [place] [address]  Comprehensive metabolic panel     Status: Abnormal   Collection Time: 08/17/18  3:31 PM  Result Value Ref Range   Sodium 143 135 - 145 mmol/L   Potassium 3.3 (L) 3.5 - 5.1 mmol/L   Chloride 105 98 - 111 mmol/L   CO2 24 22 -  32 mmol/L   Glucose, Bld 111 (H) 70 - 99 mg/dL   BUN 7 6 - 20 mg/dL   Creatinine, Ser 0.85 0.44 - 1.00 mg/dL   Calcium 9.5 8.9 - 10.3 mg/dL   Total Protein 7.4 6.5 - 8.1 g/dL   Albumin 4.6 3.5 - 5.0 g/dL   AST 24 15 - 41 U/L   ALT 23 0 - 44 U/L   Alkaline Phosphatase 34 (L) 38 - 126 U/L   Total Bilirubin 1.1 0.3 - 1.2 mg/dL   GFR calc non Af Amer >60 >60 mL/min   GFR calc Af Amer >60 >60 mL/min   Anion gap 14 5 - 15    Comment: Performed at [facility] [address], [place], [place] [address]  Lipase, blood     Status: None   Collection Time: 08/17/18  3:31 PM  Result Value Ref Range   Lipase 23 11 - 51 U/L    Comment: Performed at [facility] [address], [place], [place] [address]  I-Stat beta hCG blood, ED     Status: None   Collection Time: 08/17/18  3:57 PM  Result Value Ref Range   I-stat hCG, quantitative <5.0 <5 mIU/mL   Comment 3             Comment:   GEST. AGE      CONC.  (mIU/mL)   <=1 WEEK        5 - 50     2 WEEKS       50 - 500     3 WEEKS       100 - 10,000     4 WEEKS     1,000 - 30,000        FEMALE AND NON-PREGNANT FEMALE:     LESS THAN 5 mIU/mL   Urinalysis, Routine w reflex microscopic     Status: Abnormal   Collection Time: 08/17/18  5:17 PM  Result Value Ref Range   Color, Urine STRAW (A) YELLOW   APPearance CLEAR CLEAR   Specific Gravity, Urine >1.046 (H) 1.005 - 1.030   pH 5.0 5.0 - 8.0   Glucose, UA NEGATIVE NEGATIVE mg/dL   Hgb urine dipstick LARGE (A) NEGATIVE   Bilirubin Urine NEGATIVE NEGATIVE   Ketones, ur 5 (A) NEGATIVE mg/dL   Protein, ur NEGATIVE NEGATIVE mg/dL   Nitrite NEGATIVE NEGATIVE   Leukocytes,Ua TRACE (A) NEGATIVE   RBC / HPF >50 (H) 0 - 5 RBC/hpf   WBC, UA 6-10 0 - 5 WBC/hpf   Bacteria, UA NONE SEEN NONE SEEN   Squamous Epithelial / LPF 0-5 0 - 5   Mucus PRESENT     Comment: Performed at [facility], [address], [place], [place] [address]  SARS Coronavirus 2 (CEPHEID - Performed in [facility] hospital lab), Hosp Order     Status: None   Collection Time: 08/17/18  6:32 PM  Result Value Ref Range   SARS Coronavirus 2 NEGATIVE NEGATIVE    Comment: (NOTE) If result is NEGATIVE SARS-CoV-2 target nucleic acids are NOT DETECTED. The SARS-CoV-2 RNA is generally detectable in upper and lower  respiratory specimens during the acute phase of infection. The lowest  concentration of SARS-CoV-2 viral copies this assay can detect is 250  copies / mL. A negative result does not preclude SARS-CoV-2 infection  and should not be used as the sole basis for treatment or other  patient management decisions.  A negative result may occur with  improper specimen collection / handling, submission of specimen other  than nasopharyngeal swab, presence of viral mutation(s) within the  areas targeted by this assay, and inadequate number of viral copies  (<250 copies / mL). A negative  result must be combined with clinical  observations, patient history, and epidemiological information. If result is POSITIVE SARS-CoV-2 target nucleic acids are DETECTED. The SARS-CoV-2 RNA is generally detectable in upper and lower  respiratory specimens dur ing the acute phase of infection.  Positive  results are indicative of active infection with SARS-CoV-2.  Clinical  correlation with patient history and other diagnostic information is  necessary to determine patient infection status.  Positive results do  not rule out bacterial infection or co-infection with other viruses. If result is PRESUMPTIVE POSTIVE SARS-CoV-2 nucleic acids MAY BE PRESENT.   A presumptive positive result was obtained on the submitted specimen  and confirmed on repeat testing.  While 2019 novel coronavirus  (SARS-CoV-2) nucleic acids may be present in the submitted sample  additional confirmatory testing may be necessary for epidemiological  and / or clinical management purposes  to differentiate between  SARS-CoV-2 and other Sarbecovirus currently known to infect humans.  If clinically indicated additional testing with an alternate test  methodology [phone]) is advised. The SARS-CoV-2 RNA is generally  detectable in upper and lower respiratory sp ecimens during the acute  phase of infection. The expected result is Negative. Fact Sheet for Patients:  StrictlyIdeas.no Fact Sheet for Healthcare Providers: BankingDealers.co.za This test is not yet approved or cleared by the [place] FDA and has been authorized for detection and/or diagnosis of SARS-CoV-2 by FDA under an Emergency Use Authorization (EUA).  This EUA will remain in effect (meaning this test can be used) for the duration of the COVID-19 declaration under Section 564(b)(1) of the Act, 21 U.S.C. section 360bbb-3(b)(1), unless the authorization is terminated or revoked sooner. Performed at [facility], [place] [address] [place], [place] [address]   Lactic acid, plasma     Status: Abnormal   Collection Time: 08/17/18  6:32 PM  Result Value Ref Range   Lactic Acid, Venous 2.1 (HH) 0.5 - 1.9 mmol/L    Comment: CRITICAL RESULT CALLED TO, READ BACK BY AND VERIFIED WITH: Ginger Carne RN @ 1910 ON 08/17/2018 BY TEMOCHE, H Performed at [facility], [place] [address] [place], [place] [address]   Amylase     Status: None   Collection Time: 08/17/18  6:32 PM  Result Value Ref Range   Amylase 47 28 - 100 U/L    Comment: Performed at [facility] [address] [place], [place] [address]  CK     Status: Abnormal   Collection Time: 08/17/18  6:32 PM  Result Value Ref Range   Total CK 363 (H) 38 - 234 U/L    Comment: Performed at [facility], [place] [address], [place], [place] [address]  C-reactive protein     Status: None   Collection Time: 08/17/18  6:32 PM  Result Value Ref Range   CRP <0.8 <1.0 mg/dL    Comment: Performed at [facility], [place] [address], [place], [place] [address]  Ferritin (Iron Binding Protein)     Status: None   Collection Time: 08/17/18  6:32 PM  Result Value Ref Range   Ferritin 38 11 - 307 ng/mL    Comment: Performed at [facility], [address] [place], [place] [address]  Procalcitonin - Baseline     Status: None   Collection Time: 08/17/18  6:32 PM  Result Value Ref Range   Procalcitonin <0.10 ng/mL    Comment:        Interpretation: PCT (Procalcitonin) <= 0.5 ng/mL: Systemic infection (sepsis) is not likely. Local bacterial infection is possible. (NOTE)       Sepsis PCT Algorithm           Lower Respiratory Tract                                      Infection PCT Algorithm    ----------------------------     ----------------------------         PCT < 0.25 ng/mL                PCT < 0.10 ng/mL         Strongly encourage             Strongly discourage   discontinuation of antibiotics    initiation of antibiotics     ----------------------------     -----------------------------       PCT 0.25 - 0.50 ng/mL            PCT 0.10 - 0.25 ng/mL               OR       >80% decrease in PCT            Discourage initiation of                                            antibiotics      Encourage discontinuation           of antibiotics    ----------------------------     -----------------------------         PCT >= 0.50 ng/mL              PCT 0.26 - 0.50 ng/mL               AND        <80% decrease in PCT             Encourage initiation of                                             antibiotics       Encourage continuation           of antibiotics    ----------------------------     -----------------------------        PCT >= 0.50 ng/mL                  PCT > 0.50 ng/mL               AND         increase in PCT                  Strongly encourage  initiation of antibiotics    Strongly encourage escalation           of antibiotics                                     -----------------------------                                           PCT <= 0.25 ng/mL                                                 OR                                        > 80% decrease in PCT                                     Discontinue / Do not initiate                                             antibiotics Performed at [facility], [address], [place], [place] [address]    Ct Chest W Contrast  Result Date: 08/17/2018 CLINICAL DATA:  34 year old female with acute chest, abdominal and pelvic pain. Recent pneumomediastinum and retroperitoneal air. EXAM: CT CHEST, ABDOMEN, AND PELVIS WITH CONTRAST TECHNIQUE: Multidetector CT imaging of the chest, abdomen and pelvis was performed following the standard protocol during bolus administration of intravenous contrast. CONTRAST:  162mL OMNIPAQUE IOHEXOL 300 MG/ML  SOLN COMPARISON:  08/15/2018 abdominal and pelvic CT. FINDINGS: CT CHEST  FINDINGS Cardiovascular: The heart and great vessels are unremarkable. No thoracic aortic aneurysm or pericardial effusion. Mediastinum/Nodes: A moderate amount of pneumomediastinum is identified centered in the UPPER chest and extending into the neck. No mediastinal mass. No esophageal or thyroid abnormalities identified. Lungs/Pleura: There is no evidence of pneumothorax. No tracheal or bronchial wall defect identified. There is no evidence of airspace disease, nodule, mass or pleural effusion. Musculoskeletal: No acute or suspicious bony abnormalities identified. 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 No biliary dilatation. Pancreas: Unremarkable Spleen: Unremarkable Adrenals/Urinary Tract: The kidneys, adrenal glands and bladder are unremarkable. Stomach/Bowel: Stomach is within normal limits. No evidence of bowel wall thickening, distention, or inflammatory changes. Vascular/Lymphatic: No significant vascular findings are present. No enlarged abdominal or pelvic lymph nodes. Reproductive: Uterus and bilateral adnexa are unremarkable. Other: Gas in the RIGHT retroperitoneum and RIGHT LATERAL subcutaneous tissues have decreased since 08/15/2018. No ascites, focal collection or pneumoperitoneum. Musculoskeletal: No acute or suspicious bony abnormalities. IMPRESSION: 1. Moderate pneumomediastinum extending into the neck and DECREASED RIGHT retroperitoneal and RIGHT lateral abdominal subcutaneous gas. This most likely represents rupture of a central airway with extension into the neck and abdomen. No evidence of pneumothorax.  Consider clinical follow-up. 2. No other acute or significant abnormalities. Electronically Signed   By: Margarette Canada M.D.   On: 08/17/2018 17:06   Ct Abdomen Pelvis W Contrast  Result Date: 08/17/2018 CLINICAL DATA:   33 year old female with acute chest, abdominal and pelvic pain. Recent pneumomediastinum and retroperitoneal air. EXAM: CT CHEST, ABDOMEN, AND PELVIS WITH CONTRAST TECHNIQUE: Multidetector CT imaging of the chest, abdomen and pelvis was performed following the standard protocol during bolus administration of intravenous contrast. CONTRAST:  118mL OMNIPAQUE IOHEXOL 300 MG/ML  SOLN COMPARISON:  08/15/2018 abdominal and pelvic CT. FINDINGS: CT CHEST FINDINGS Cardiovascular: The heart and great vessels are unremarkable. No thoracic aortic aneurysm or pericardial effusion. Mediastinum/Nodes: A moderate amount of pneumomediastinum is identified centered in the UPPER chest and extending into the neck. No mediastinal mass. No esophageal or thyroid abnormalities identified. Lungs/Pleura: There is no evidence of pneumothorax. No tracheal or bronchial wall defect identified. There is no evidence of airspace disease, nodule, mass or pleural effusion. Musculoskeletal: No acute or suspicious bony abnormalities identified. 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 No biliary dilatation. Pancreas: Unremarkable Spleen: Unremarkable Adrenals/Urinary Tract: The kidneys, adrenal glands and bladder are unremarkable. Stomach/Bowel: Stomach is within normal limits. No evidence of bowel wall thickening, distention, or inflammatory changes. Vascular/Lymphatic: No significant vascular findings are present. No enlarged abdominal or pelvic lymph nodes. Reproductive: Uterus and bilateral adnexa are unremarkable. Other: Gas in the RIGHT retroperitoneum and RIGHT LATERAL subcutaneous tissues have decreased since 08/15/2018. No ascites, focal collection or pneumoperitoneum. Musculoskeletal: No acute or suspicious bony abnormalities. IMPRESSION: 1. Moderate pneumomediastinum extending  into the neck and DECREASED RIGHT retroperitoneal and RIGHT lateral abdominal subcutaneous gas. This most likely represents rupture of a central airway with extension into the neck and abdomen. No evidence of pneumothorax. Consider clinical follow-up. 2. No other acute or significant abnormalities. Electronically Signed   By: Margarette Canada M.D.   On: 08/17/2018 17:06    Pending Labs Unresulted Labs (From admission, onward)    Start     Ordered   08/18/18 0500  Comprehensive metabolic panel  Tomorrow morning,   R     08/17/18 2113   08/18/18 0500  CBC  Tomorrow morning,   R     08/17/18 2113   08/17/18 1747  Lactic acid, plasma  Now then every 2 hours,   STAT     08/17/18 1748          Vitals/Pain Today's Vitals   08/17/18 1830 08/17/18 1930 08/17/18 2030 08/17/18 2038  BP:  131/87 (!) 114/91   Pulse:  62 71   Resp: 16 11 16    Temp:      TempSrc:      SpO2:  100% 100%   Weight:      Height:      PainSc:    3     Isolation Precautions No active isolations  Medications Medications  enoxaparin (LOVENOX) injection 40 mg (has no administration in time range)  acetaminophen (TYLENOL) tablet 650 mg (has no administration in time range)    Or  acetaminophen (TYLENOL) suppository 650 mg (has no administration in time range)  promethazine (PHENERGAN) tablet 12.5 mg (has no administration in time range)  senna-docusate (Senokot-S) tablet 1 tablet (has no administration in time range)  ondansetron (ZOFRAN) injection 4 mg (4 mg Intravenous Given 08/17/18 1547)  morphine  4 MG/ML injection 4 mg (4 mg Intravenous Given 08/17/18 1547)  pantoprazole (PROTONIX) injection 40 mg (40 mg Intravenous Given 08/17/18 1551)  metoCLOPramide (REGLAN) injection 10 mg (10 mg Intravenous Given 08/17/18 1613)  diphenhydrAMINE (BENADRYL) injection 12.5 mg (12.5 mg Intravenous Given 08/17/18 1617)  iohexol (OMNIPAQUE) 300 MG/ML solution 100 mL (100 mLs Intravenous Contrast Given 08/17/18 1636)  HYDROmorphone  (DILAUDID) injection 1 mg (1 mg Intravenous Given 08/17/18 1625)  sodium chloride 0.9 % bolus 1,000 mL (1,000 mLs Intravenous New Bag/Given 08/17/18 1724)  HYDROmorphone (DILAUDID) injection 1 mg (1 mg Intravenous Given 08/17/18 1836)    Mobility ambulatory Low fall risk   Focused Assessments pulm Room air  R Recommendations: See Admitting Provider Note  Report given to:   Additional Notes:

## 2018-08-17 NOTE — Progress Notes (Signed)
Pt has arrived to the floor via a stretcher, received by the RN CN and the NT. She is alert and oriented and in no distress. Pt settled in her and oriented to room and equipment. Will continue to monitor.

## 2018-08-17 NOTE — ED Provider Notes (Addendum)
[facility] EMERGENCY DEPARTMENT Provider Note   CSN: [id number] Arrival date & time: 08/17/18  1505  History   Chief Complaint Chief Complaint  Patient presents with   Abdominal Pain   HPI Samantha Noble is a 33 y.o. female with a hx of tobacco abuse, GERD, gastritis, IBS, prior pancreatitis, & recent admission for retroperitoneal air who presents to the ED with complaints of abdominal pain w/ N/V which worsened this AM. Patient discharged from the hospital yesterday, states she had abdominal discomfort that was bearable at that time at a 6/10 in severity, was able to go to sleep last night, and woke up this AM @ 7:30 with 10/10 abdominal pain located to the LUQ/LLQ, feels like a pressure that does radiating to the lower chest. Went to the pharmacy to pick up her medications, tried taking these and subsequently vomited. Emesis x 2. Pain is the same as when she was admitted. Has had 1 BM since onset of pain, was loose stool. Denies fever, chills, hematemesis, constipation, melena, hematochezia, dysuria, or vaginal discharge. She is currently on her menstrual cycle. Denies dyspnea, cough, or hemoptysis.   Per chart review: recent admission 05/05-05/09 2020: presented to the ER w/ upper abdominal pain w/ N/V/D- found to have retroperitoneum free air on CT imaging- admitted to surgical service. Upper GI performed w/ GI consultation throughout hospital stay. Received flagyl/cipro, cipro changed to rocephin due to ADS. Repeat CT improved 05/08. Per discharge note it was felt that the findings more likely represented an episode of severe retching causing retroperitoneal air creation without significant GI tract injury.  Discharged w/ oxycodone, protonix, & zofran w/ GI follow up.   HPI  Past Medical History:  Diagnosis Date   Acid reflux disease    Gastritis    GERD (gastroesophageal reflux disease)    IBS (irritable bowel syndrome)    Pancreatitis     Patient Active  Problem List   Diagnosis Date Noted   Retroperitoneal air    Abdominal pain    Intra-abdominal free air of unknown etiology 08/13/2018    Past Surgical History:  Procedure Laterality Date   NO PAST SURGERIES       OB History    Gravida  0   Para      Term      Preterm      AB      Living        SAB      TAB      Ectopic      Multiple      Live Births               Home Medications    Prior to Admission medications   Medication Sig Start Date End Date Taking? Authorizing Provider  ALPRAZolam Duanne Moron) 0.5 MG tablet Take 0.25-0.5 mg by mouth every 6 (six) hours as needed for anxiety.  07/27/18   [provider]  capsaicin (ZOSTRIX) 0.025 % cream Apply topically daily. 08/16/18   Cornett, Marcello Moores, MD  dicyclomine (BENTYL) 20 MG tablet Take 1 tablet (20 mg total) by mouth 2 (two) times daily. Patient not taking: Reported on 08/13/2018 10/25/17   Ok Edwards, PA-C  eszopiclone (LUNESTA) 2 MG TABS tablet Take 2 mg by mouth at bedtime. 07/06/18   [provider]  ondansetron (ZOFRAN-ODT) 4 MG disintegrating tablet Take 1 tablet (4 mg total) by mouth every 6 (six) hours as needed for nausea. 08/16/18   Cornett,  Marcello Moores, MD  oxyCODONE (OXY IR/ROXICODONE) 5 MG immediate release tablet Take 1 tablet (5 mg total) by mouth every 6 (six) hours as needed for severe pain. 08/16/18   Cornett, Marcello Moores, MD  pantoprazole (PROTONIX) 20 MG tablet Take 1 tablet (20 mg total) by mouth daily. 08/16/18 08/16/19  Cornett, Marcello Moores, MD  polyethylene glycol [facility]) packet Take 17 g by mouth daily. Patient not taking: Reported on 08/13/2018 10/25/17   Ok Edwards, PA-C  promethazine (PHENERGAN) 25 MG suppository Place 1 suppository (25 mg total) rectally every 6 (six) hours as needed for nausea or vomiting. Patient not taking: Reported on 08/13/2018 10/15/14   Evelina Bucy, MD  sucralfate (CARAFATE) 1 G tablet Take 1 tablet (1 g total) by mouth 4 (four) times daily. Patient not taking: Reported  on 08/13/2018 06/27/12 08/13/27  Carmin Muskrat, MD    Family History History reviewed. No pertinent family history.  Social History Social History   Tobacco Use   Smoking status: Current Some Day Smoker    Packs/day: 0.10    Years: 2.00    Pack years: 0.20    Types: Cigarettes   Smokeless tobacco: Never Used  Substance Use Topics   Alcohol use: Yes    Comment: last drink SAT   Drug use: Yes    Types: Marijuana     Allergies   Patient has no known allergies.   Review of Systems Review of Systems  Constitutional: Negative for chills and fever.  Respiratory: Negative for shortness of breath.   Cardiovascular: Negative for chest pain.  Gastrointestinal: Positive for abdominal pain, diarrhea, nausea and vomiting. Negative for anal bleeding, blood in stool and constipation.  Genitourinary: Positive for vaginal bleeding (currently menstruating). Negative for enuresis and vaginal discharge.  All other systems reviewed and are negative.    Physical Exam Updated Vital Signs BP (!) 158/107 (BP Location: Right Arm)    Pulse 98    Temp 97.9 F (36.6 C) (Oral)    Resp 18    Ht 5' 4" (1.626 m)    Wt 61.2 kg    LMP 08/10/2018 Comment: neg preg test 08/13/2018   SpO2 100%    BMI 23.17 kg/m   Physical Exam Vitals signs and nursing note reviewed.  Constitutional:      Appearance: She is well-developed. She is not toxic-appearing.     Comments: Moaning on stretcher  HENT:     Head: Normocephalic and atraumatic.  Eyes:     General:        Right eye: No discharge.        Left eye: No discharge.     Conjunctiva/sclera: Conjunctivae normal.  Neck:     Musculoskeletal: Neck supple.  Cardiovascular:     Rate and Rhythm: Normal rate and regular rhythm.  Pulmonary:     Effort: Pulmonary effort is normal. No respiratory distress.     Breath sounds: Normal breath sounds. No wheezing, rhonchi or rales.  Abdominal:     General: There is no distension.     Palpations: Abdomen is  soft.     Tenderness: There is abdominal tenderness in the left upper quadrant and left lower quadrant. There is no rebound.     Comments: Bruise noted to LLQ- patient states this was location of lovenox injections during hospitalization.   Skin:    General: Skin is warm and dry.     Findings: No rash.  Neurological:     Mental Status: She is alert.  Comments: Clear speech.   Psychiatric:        Behavior: Behavior normal.    ED Treatments / Results  Labs (all labs ordered are listed, but only abnormal results are displayed) Labs Reviewed  CBC WITH DIFFERENTIAL/PLATELET - Abnormal; Notable for the following components:      Result Value   WBC 11.4 (*)    Neutro Abs 10.0 (*)    All other components within normal limits  COMPREHENSIVE METABOLIC PANEL - Abnormal; Notable for the following components:   Potassium 3.3 (*)    Glucose, Bld 111 (*)    Alkaline Phosphatase 34 (*)    All other components within normal limits  LIPASE, BLOOD  URINALYSIS, ROUTINE W REFLEX MICROSCOPIC  I-STAT BETA HCG BLOOD, ED ([facility], [facility], [facility] ONLY)    EKG EKG Interpretation  Date/Time:  Sunday Aug 17 2018 15:50:10 EDT Ventricular Rate:  95 PR Interval:    QRS Duration: 67 QT Interval:  363 QTC Calculation: 457 R Axis:   66 Text Interpretation:  Sinus rhythm Baseline wander No old tracing to compare Confirmed by Pattricia Boss [phone]) on 08/17/2018 3:52:15 PM   Radiology Ct Chest W Contrast  Result Date: 08/17/2018 CLINICAL DATA:  33 year old female with acute chest, abdominal and pelvic pain. Recent pneumomediastinum and retroperitoneal air. EXAM: CT CHEST, ABDOMEN, AND PELVIS WITH CONTRAST TECHNIQUE: Multidetector CT imaging of the chest, abdomen and pelvis was performed following the standard protocol during bolus administration of intravenous contrast. CONTRAST:  152m OMNIPAQUE IOHEXOL 300 MG/ML  SOLN COMPARISON:  08/15/2018 abdominal and pelvic CT. FINDINGS: CT CHEST FINDINGS Cardiovascular: The  heart and great vessels are unremarkable. No thoracic aortic aneurysm or pericardial effusion. Mediastinum/Nodes: A moderate amount of pneumomediastinum is identified centered in the UPPER chest and extending into the neck. No mediastinal mass. No esophageal or thyroid abnormalities identified. Lungs/Pleura: There is no evidence of pneumothorax. No tracheal or bronchial wall defect identified. There is no evidence of airspace disease, nodule, mass or pleural effusion. Musculoskeletal: No acute or suspicious bony abnormalities identified. 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 No biliary dilatation. Pancreas: Unremarkable Spleen: Unremarkable Adrenals/Urinary Tract: The kidneys, adrenal glands and bladder are unremarkable. Stomach/Bowel: Stomach is within normal limits. No evidence of bowel wall thickening, distention, or inflammatory changes. Vascular/Lymphatic: No significant vascular findings are present. No enlarged abdominal or pelvic lymph nodes. Reproductive: Uterus and bilateral adnexa are unremarkable. Other: Gas in the RIGHT retroperitoneum and RIGHT LATERAL subcutaneous tissues have decreased since 08/15/2018. No ascites, focal collection or pneumoperitoneum. Musculoskeletal: No acute or suspicious bony abnormalities. IMPRESSION: 1. Moderate pneumomediastinum extending into the neck and DECREASED RIGHT retroperitoneal and RIGHT lateral abdominal subcutaneous gas. This most likely represents rupture of a central airway with extension into the neck and abdomen. No evidence of pneumothorax. Consider clinical follow-up. 2. No other acute or significant abnormalities. Electronically Signed   By: JMargarette CanadaM.D.   On: 08/17/2018 17:06   Ct Abdomen Pelvis W Contrast  Result Date: 08/17/2018 CLINICAL DATA:  33year old female with acute chest,  abdominal and pelvic pain. Recent pneumomediastinum and retroperitoneal air. EXAM: CT CHEST, ABDOMEN, AND PELVIS WITH CONTRAST TECHNIQUE: Multidetector CT imaging of the chest, abdomen and pelvis was performed following the standard protocol during bolus administration of intravenous contrast. CONTRAST:  1020mOMNIPAQUE IOHEXOL 300 MG/ML  SOLN COMPARISON:  08/15/2018 abdominal and pelvic CT. FINDINGS: CT  CHEST FINDINGS Cardiovascular: The heart and great vessels are unremarkable. No thoracic aortic aneurysm or pericardial effusion. Mediastinum/Nodes: A moderate amount of pneumomediastinum is identified centered in the UPPER chest and extending into the neck. No mediastinal mass. No esophageal or thyroid abnormalities identified. Lungs/Pleura: There is no evidence of pneumothorax. No tracheal or bronchial wall defect identified. There is no evidence of airspace disease, nodule, mass or pleural effusion. Musculoskeletal: No acute or suspicious bony abnormalities identified. 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 No biliary dilatation. Pancreas: Unremarkable Spleen: Unremarkable Adrenals/Urinary Tract: The kidneys, adrenal glands and bladder are unremarkable. Stomach/Bowel: Stomach is within normal limits. No evidence of bowel wall thickening, distention, or inflammatory changes. Vascular/Lymphatic: No significant vascular findings are present. No enlarged abdominal or pelvic lymph nodes. Reproductive: Uterus and bilateral adnexa are unremarkable. Other: Gas in the RIGHT retroperitoneum and RIGHT LATERAL subcutaneous tissues have decreased since 08/15/2018. No ascites, focal collection or pneumoperitoneum. Musculoskeletal: No acute or suspicious bony abnormalities. IMPRESSION: 1. Moderate pneumomediastinum extending into the neck and DECREASED RIGHT  retroperitoneal and RIGHT lateral abdominal subcutaneous gas. This most likely represents rupture of a central airway with extension into the neck and abdomen. No evidence of pneumothorax. Consider clinical follow-up. 2. No other acute or significant abnormalities. Electronically Signed   By: Margarette Canada M.D.   On: 08/17/2018 17:06    Procedures Procedures (including critical care time)  Medications Ordered in ED Medications  HYDROmorphone (DILAUDID) injection 1 mg (has no administration in time range)  ondansetron (ZOFRAN) injection 4 mg (4 mg Intravenous Given 08/17/18 1547)  morphine 4 MG/ML injection 4 mg (4 mg Intravenous Given 08/17/18 1547)  pantoprazole (PROTONIX) injection 40 mg (40 mg Intravenous Given 08/17/18 1551)  metoCLOPramide (REGLAN) injection 10 mg (10 mg Intravenous Given 08/17/18 1613)  diphenhydrAMINE (BENADRYL) injection 12.5 mg (12.5 mg Intravenous Given 08/17/18 1617)  iohexol (OMNIPAQUE) 300 MG/ML solution 100 mL (100 mLs Intravenous Contrast Given 08/17/18 1636)  HYDROmorphone (DILAUDID) injection 1 mg (1 mg Intravenous Given 08/17/18 1625)  sodium chloride 0.9 % bolus 1,000 mL (1,000 mLs Intravenous New Bag/Given 08/17/18 1724)     Initial Impression / Assessment and Plan / ED Course  I have reviewed the triage vital signs and the nursing notes.  Pertinent labs & imaging results that were available during my care of the patient were reviewed by me and considered in my medical decision making (see chart for details).   Patient w/ recent admission to general surgery for retroperitoneal free air returns to the ER for worsened abdominal pain since discharge w/ N/V. On exam she is non toxic appearing, but does seem uncomfortable w/ frequent moaning throughout exam. Vitals WNL with the exception of elevated BP- doubt HTN emergency. Plan for analgesics/anti-emetics, labs, CT. EKG to check QTc given suspect multiple anti-emetics may be required.   EKG: Sinus rhythm.  QTc  15:57: CONSULT: Discussed w/ general surgeon Dr. Donne Hazel- aware of patient in the ER requesting call back w/ results.  --> re-discussed w/ Dr. Donne Hazel shortly after- recommends adding on CT chest w/ contrast.   16:08: Patient w/ continued emesis- reglan & benadryl ordered.   16:22: Persistent pain, dilaudid ordered.   Labs:  CBC: Leukocytosis @ 11.4 w/ left shift, improved from discharge. No anemia.  CMP: Mild hypokalemia, alk phos somewhat low.  Lipase: WNL UA: consistent w/ menses.  CT imaging:  IMPRESSION: 1. Moderate pneumomediastinum extending into the neck and DECREASED RIGHT retroperitoneal and RIGHT lateral abdominal subcutaneous gas. This most likely represents rupture of a central airway with extension into the neck and abdomen. No evidence of pneumothorax. Consider clinical follow-up. 2. No other acute or significant abnormalities  17:25: CONSULT: Discussed w/ Dr. Donne Hazel who has reviewed imaging, given negative recent UGI study in the hospital 05/07 pneumomediastinum does not seem to be coming from the esophagus, likely an airway issue, not general surgery issue. Appreciate his input.    17:41: CONSULT: Discussed w/ Dr. Chase Caller w/ PCCM who had reviewed imaging, recommends discuss w/ radiology regarding improvement/worsening of pneumomediastinum on today's CT compared to views on the CT abdomen/pelvis 2 days prior for more information. Recommends adding labs including lactic acid, amylase, CRP, ferritin, procalcitonin, and CK as well as repeat covid testing. Recommends admit to triad/teaching service for observation w/ re consultation should labs come back abnormal, if grossly abnormal may also need CT surgery consultation at that time.   17:57: Discussed w/ radiologist Dr. Melanee Spry- states appears somewhat similar, but difficult to fully assess w/ limited imaging of the chest w/ prior CT.   18:32: CONSULT: Discussed w/ IM teaching service- accepts admission- will be seen by  night team.   Samantha Noble was evaluated in Emergency Department on 08/17/2018 for the symptoms described in the history of present illness. He/she was evaluated in the context of the global COVID-19 pandemic, which necessitated consideration that the patient might be at risk for infection with the SARS-CoV-2 virus that causes COVID-19. Institutional protocols and algorithms that pertain to the evaluation of patients at risk for COVID-19 are in a state of rapid change based on information released by regulatory bodies including the CDC and federal and state organizations. These policies and algorithms were followed during the patient's care in the ED.   Findings and plan of care discussed with supervising physician Dr. Jeanell Sparrow who is in agreement.    Final Clinical Impressions(s) / ED Diagnoses   Final diagnoses:  Pneumomediastinum [facility])    ED Discharge Orders    None       Amaryllis Dyke, PA-C 08/17/18 1920  Re-discussed w/ IM teaching service evening shift team- concern for appropriateness for their service- they have discussed w/ PCCM- re-discussed @ 21:00- accept admission, will be down to the ER to see the patient in approximately 15 minutes.      Leafy Kindle 08/17/18 2114    Pattricia Boss, MD 08/18/18 [phone]

## 2018-08-17 NOTE — ED Triage Notes (Signed)
Pt arrives POV for left sided abdominal pain. Pt prescribed medicine at discharge, attempted to take, but threw up. Pt reports was told to return if pain returned/worsened. Pt endorses pain currently 10/10.

## 2018-08-17 NOTE — Progress Notes (Signed)
Patient ID: Samantha Noble, female   DOB: [birth year], 33 y.o.   MRN: [id number] Patient discharged yesterday.  Back in er today. I recommended ct of chest as well.  ugi 5/7 is negative, prior ct scans. Ct c/a/p today shows less rp air. This appears likely airway related and no need for general surgery. I discussed scans with radiology

## 2018-08-17 NOTE — ED Notes (Addendum)
Admitting team at bedside. RN to transport when finished with pt.

## 2018-08-17 NOTE — H&P (Addendum)
Date: 08/17/2018               Patient Name:  Samantha Noble MRN: [id number]  DOB: 1985-10-16 Age / Sex: 33 y.o., female   PCP: Patient, No Pcp Per         Medical Service: Internal Medicine Teaching Service         Attending Physician: Dr. Lynnae January, Real Cons, MD    First Contact: Dr. Donne Hazel Pager: [phone]  Second Contact: Dr. Frederico Hamman Pager: [phone]       After Hours (After 5p/  First Contact Pager: [phone]  weekends / holidays): Second Contact Pager: [phone]   Chief Complaint: abdominal pain, nausea, and vomiting  History of Present Illness:  Samantha Noble is a 33 year old female with GERD, gastritis, IBS, history of pancreatitis, and recent admission for retroperitoneal air who presents with abdominal pain, nausea, and vomiting.  She was admitted from 5/5 to 5/9 due to abdominal pain and retro-peritoneal free air noted by CT scan.  She underwent an upper GI series which demonstrated normal-appearing stomach and duodenum, without extravasation or evidence of ulcer. She was placed on Flagyl and Cipro with improvement in her symptoms.  It was unclear the original site of perforation but given clinical improvement it was suspected to have healed on its own. Follow-up CT scan showed almost complete resolution of the retroperitoneal air.  Her clinical symptoms improved and she was discharged home. It was felt that her findings were likely secondary to severe retching causing retroperitoneal air creation without significant GI tract injury. She was to follow-up with outpatient gastroenterology for EGD and colonoscopy in 6 to 8 weeks.  She states that since being discharged from the hospital she had complete resolution of her abdominal discomfort.  She then woke up this morning with left lower quadrant pain which she describes as pulsating and intermittently occurring every 5 minutes.  She also has had associated abdominal bloating and nausea.  She denies radiation to the chest or back  and shortness of breath.  She subsequently went to the pharmacy to pick up her pain medications and tried taking these but subsequently had nonbilious nonbloody emesis. She also reports one loose bowel movement this morning but reports that this is normal for her.  She states that all of her symptoms have resolved and she is currently back to her baseline.  Of note she reports having the same abdominal pain intermittently throughout the last 8 years.  She has been seen in the hospital multiple times for these complaints but this has been attributed to her irritable bowel syndrome and usually resolves with symptom management.  He reports that the abdominal pain she is feeling currently is essentially the same as she has had in the past but more severe.  She has a history of alcohol use disorder and was drinking up to 5 liquor shots a day but has significantly cut back and only rarely drinks now. Her last alcoholic drink was more than a week ago.  He does have a history of marijuana use-last use 1 joint of marijuana yesterday.  She denies chest pain, palpitations, fevers, chills, hematochezia, hemoptysis, melena, and urinary symptoms.  She reports a good appetite and is currently hungry.  In the ED she was found to be mildly hypertensive with SBP into the 150s with otherwise normal vital signs.  Labs were significant for mild lactic acidosis of 2.1, elevated CK of 363, leukocytosis of 11.4, hypokalemia of 3.3. Urinalysis was positive for  hematuria and ketonuria. Repeat CT abdomen and chest showed moderate pneumomediastinum extending into the neck and decreased right retroperitoneal and right lateral abdominal subcutaneous gas. General surgery was consulted and believed that her symptoms were likely airway related and no need for general surgery. PCCM was consulted and recommended admission to medicine service and re-consultation if she decompensates. IMTS was consulted for admission. We then re-consulted PCCM  who does not recommend ICU admission at this time given that she is hemodynamically stable.    Meds:  No current facility-administered medications on file prior to encounter.    Current Outpatient Medications on File Prior to Encounter  Medication Sig   ALPRAZolam (XANAX) 0.5 MG tablet Take 0.25-0.5 mg by mouth every 6 (six) hours as needed for anxiety.    capsaicin (ZOSTRIX) 0.025 % cream Apply topically daily.   dicyclomine (BENTYL) 20 MG tablet Take 1 tablet (20 mg total) by mouth 2 (two) times daily. (Patient not taking: Reported on 08/13/2018)   eszopiclone (LUNESTA) 2 MG TABS tablet Take 2 mg by mouth at bedtime.   ondansetron (ZOFRAN-ODT) 4 MG disintegrating tablet Take 1 tablet (4 mg total) by mouth every 6 (six) hours as needed for nausea.   oxyCODONE (OXY IR/ROXICODONE) 5 MG immediate release tablet Take 1 tablet (5 mg total) by mouth every 6 (six) hours as needed for severe pain.   pantoprazole (PROTONIX) 20 MG tablet Take 1 tablet (20 mg total) by mouth daily.   polyethylene glycol (MIRALAX) packet Take 17 g by mouth daily. (Patient not taking: Reported on 08/13/2018)   promethazine (PHENERGAN) 25 MG suppository Place 1 suppository (25 mg total) rectally every 6 (six) hours as needed for nausea or vomiting. (Patient not taking: Reported on 08/13/2018)   sucralfate (CARAFATE) 1 G tablet Take 1 tablet (1 g total) by mouth 4 (four) times daily. (Patient not taking: Reported on 08/13/2018)    Allergies: Allergies as of 08/17/2018   (No Known Allergies)   Past Medical History:  Diagnosis Date   Acid reflux disease    Gastritis    GERD (gastroesophageal reflux disease)    IBS (irritable bowel syndrome)    Pancreatitis     Family History: No significant family history.  Social History: Used to smoke tobacco 1 month, black and miles 1.5 per day, for past 10 years Used to drink 3-5 shots per day. Last shot 2 weeks ago, last glass of wine 1 week ago. Marijuana 1-2  joints 4-5 times per week for 9 years. No heroin, cocaine, other drugs.    Review of Systems: A complete ROS was negative except as per HPI.   Physical Exam: Blood pressure 131/87, pulse 62, temperature 97.9 F (36.6 C), temperature source Oral, resp. rate 11, height 5\' 4"  (1.626 m), weight 61.2 kg, last menstrual period 08/10/2018, SpO2 100 %. Note: Lying in bed in no acute distress HEENT: Cephalic and atraumatic, EOMI Cardiovascular: Normal rate, regular rhythm, crepitus noted at the prior left and right vascular borders, No crepitus noted in the neck or face. Respiratory: Clear to auscultation bilaterally, normal work of breathing, normal oxygen saturations on room air Abdominal: Nontender to palpation, soft, no masses appreciated MSK: No lower extreme edema, erythema, or warmth noted bilaterally Skin: Warm and dry Psych: Anxious appearing female with normal behavior  EKG: personally reviewed my interpretation is normal sinus rhythm.  CT chest and abdomen: IMPRESSION: 1. Moderate pneumomediastinum extending into the neck and DECREASED RIGHT retroperitoneal and RIGHT lateral abdominal subcutaneous gas. This most likely  represents rupture of a central airway with extension into the neck and abdomen. No evidence of pneumothorax. Consider clinical follow-up. 2. No other acute or significant abnormalities.  Assessment & Plan by Problem: Active Problems:   Pneumomediastinum ([place])  Ms. Heringer is a 33 year old female with GERD, gastritis, IBS, history of pancreatitis, and recent admission for retroperitoneal air who presents with abdominal pain, N/V, and found to have improvement in her retroperitoneum air but newly visualized pneumomediastinum.  Differential for retroperitoneal air/pneumomediastinum includes perforation in the upper gastrointestinal tract including esophagus (Boerhaave syndrome 2/2 cannabinoid hyperemesis syndrome and frequent retching or less likely iatrogenic 2/2  recent endoscopy). I believe that her signs and symptoms are unlikely to be due to a perforation in the respiratory tract given that she has had no respiratory distress and has normal oxygen saturations on room air. Today's symptoms of LLQ pain and vomiting which has now resolved is most likely 2/2 her chronic IBS vs cannabinoid hyperemesis syndrome and unlikely caused by a new or larger perforation. Her pneumomediastinum was noted on a recent abdominal CT but a chest CT was not ordered to fully visualize the extent of it and would have likely showed similar findings to today's imaging.    Pneumomediastinum, retroperitoneum air: - Ceftriaxone 2 g QDand Flagyl 500 mg q8h - Keep n.p.o.; can have sips of clear fluids - Pantoprazole 40 mg IV - IV fluids - Plan on repeat imaging tomorrow to evaluate for progression of pneumomediastinum and retroperitoneum air. - Will consider GI consult in the morning  Cannabinoid hyperemesis syndrome:  - Recommend complete cessation of marijuana  GAD/PTSD: Followed by [facility] [place] [place]. Anxious appearing but overall stable.  - Home meds include xanax 0.5 mg PRN--given one dose today; zolpidem; Lunesta 2 mg at bedtime; Gave on time dose of Xanax and holding any other meds for now.   Dispo: Admit patient to Observation with expected length of stay less than 2 midnights.  Signed: Carroll Sage, MD 08/17/2018, 8:58 PM  Pager: [phone]

## 2018-08-18 ENCOUNTER — Other Ambulatory Visit: Payer: Self-pay

## 2018-08-18 ENCOUNTER — Observation Stay (HOSPITAL_COMMUNITY): Payer: 59

## 2018-08-18 DIAGNOSIS — R1110 Vomiting, unspecified: Secondary | ICD-10-CM

## 2018-08-18 DIAGNOSIS — K589 Irritable bowel syndrome without diarrhea: Secondary | ICD-10-CM

## 2018-08-18 DIAGNOSIS — Z87891 Personal history of nicotine dependence: Secondary | ICD-10-CM

## 2018-08-18 DIAGNOSIS — F12988 Cannabis use, unspecified with other cannabis-induced disorder: Secondary | ICD-10-CM | POA: Diagnosis not present

## 2018-08-18 DIAGNOSIS — F411 Generalized anxiety disorder: Secondary | ICD-10-CM

## 2018-08-18 DIAGNOSIS — Z8719 Personal history of other diseases of the digestive system: Secondary | ICD-10-CM

## 2018-08-18 DIAGNOSIS — Z79899 Other long term (current) drug therapy: Secondary | ICD-10-CM

## 2018-08-18 DIAGNOSIS — K219 Gastro-esophageal reflux disease without esophagitis: Secondary | ICD-10-CM

## 2018-08-18 DIAGNOSIS — J982 Interstitial emphysema: Principal | ICD-10-CM

## 2018-08-18 DIAGNOSIS — K2970 Gastritis, unspecified, without bleeding: Secondary | ICD-10-CM

## 2018-08-18 DIAGNOSIS — F4310 Post-traumatic stress disorder, unspecified: Secondary | ICD-10-CM

## 2018-08-18 LAB — COMPREHENSIVE METABOLIC PANEL
ALT: 21 U/L (ref 0–44)
AST: 20 U/L (ref 15–41)
Albumin: 3.2 g/dL — ABNORMAL LOW (ref 3.5–5.0)
Alkaline Phosphatase: 25 U/L — ABNORMAL LOW (ref 38–126)
Anion gap: 13 (ref 5–15)
BUN: 8 mg/dL (ref 6–20)
CO2: 23 mmol/L (ref 22–32)
Calcium: 8.6 mg/dL — ABNORMAL LOW (ref 8.9–10.3)
Chloride: 100 mmol/L (ref 98–111)
Creatinine, Ser: 0.87 mg/dL (ref 0.44–1.00)
GFR calc Af Amer: >60 mL/min (ref >60)
GFR calc non Af Amer: >60 mL/min (ref >60)
Glucose, Bld: 83 mg/dL (ref 70–99)
Potassium: 2.7 mmol/L — CL (ref 3.5–5.1)
Sodium: 136 mmol/L (ref 135–145)
Total Bilirubin: 1 mg/dL (ref 0.3–1.2)
Total Protein: 5.5 g/dL — ABNORMAL LOW (ref 6.5–8.1)

## 2018-08-18 LAB — CBC
HCT: 30.6 % — ABNORMAL LOW (ref 36.0–46.0)
Hemoglobin: 10.5 g/dL — ABNORMAL LOW (ref 12.0–15.0)
MCH: 31.1 pg (ref 26.0–34.0)
MCHC: 34.3 g/dL (ref 30.0–36.0)
MCV: 90.5 fL (ref 80.0–100.0)
Platelets: 178 10*3/uL (ref 150–400)
RBC: 3.38 MIL/uL — ABNORMAL LOW (ref 3.87–5.11)
RDW: 12.9 % (ref 11.5–15.5)
WBC: 11.1 10*3/uL — ABNORMAL HIGH (ref 4.0–10.5)
nRBC: 0 % (ref 0.0–0.2)

## 2018-08-18 MED ORDER — METRONIDAZOLE 500 MG PO TABS
500.0000 mg | ORAL_TABLET | Freq: Three times a day (TID) | ORAL | Status: DC
  Administered 2018-08-18: 500 mg via ORAL
  Filled 2018-08-18: qty 1

## 2018-08-18 MED ORDER — PANTOPRAZOLE SODIUM 40 MG IV SOLR: 40.0000 mg | INTRAVENOUS | Status: DC

## 2018-08-18 MED ORDER — IOHEXOL 300 MG/ML  SOLN
150.0000 mL | Freq: Once | INTRAMUSCULAR | Status: AC | PRN
  Administered 2018-08-18: 150 mL via ORAL

## 2018-08-18 MED ORDER — PROMETHAZINE HCL 25 MG/ML IJ SOLN: 12.5000 mg | Freq: Four times a day (QID) | INTRAMUSCULAR | Status: DC | PRN

## 2018-08-18 MED ORDER — ALPRAZOLAM 0.5 MG PO TABS: 0.5000 mg | ORAL_TABLET | Freq: Four times a day (QID) | ORAL | Status: DC | PRN

## 2018-08-18 MED ORDER — SODIUM CHLORIDE 0.9 % IV SOLN
2.0000 g | Freq: Every day | INTRAVENOUS | Status: DC
  Administered 2018-08-18 (×2): 2 g via INTRAVENOUS
  Filled 2018-08-18 (×3): qty 20

## 2018-08-18 MED ORDER — RAMELTEON 8 MG PO TABS
8.0000 mg | ORAL_TABLET | Freq: Once | ORAL | Status: AC
  Administered 2018-08-18: 23:00:00 8 mg via ORAL
  Filled 2018-08-18 (×2): qty 1

## 2018-08-18 MED ORDER — POTASSIUM CHLORIDE CRYS ER 20 MEQ PO TBCR: 40.0000 meq | EXTENDED_RELEASE_TABLET | Freq: Two times a day (BID) | ORAL | Status: DC

## 2018-08-18 MED ORDER — PROMETHAZINE HCL 25 MG PO TABS: 12.5000 mg | ORAL_TABLET | Freq: Four times a day (QID) | ORAL | Status: DC | PRN

## 2018-08-18 MED ORDER — LORAZEPAM 2 MG/ML IJ SOLN
0.5000 mg | Freq: Four times a day (QID) | INTRAMUSCULAR | Status: DC | PRN
  Administered 2018-08-18 – 2018-08-19 (×2): 0.5 mg via INTRAVENOUS
  Filled 2018-08-18 (×2): qty 1

## 2018-08-18 MED ORDER — POTASSIUM CHLORIDE 10 MEQ/100ML IV SOLN
10.0000 meq | INTRAVENOUS | Status: AC
  Administered 2018-08-18 (×4): 10 meq via INTRAVENOUS
  Filled 2018-08-18 (×4): qty 100

## 2018-08-18 MED ORDER — METRONIDAZOLE IN NACL 5-0.79 MG/ML-% IV SOLN
500.0000 mg | Freq: Three times a day (TID) | INTRAVENOUS | Status: DC
  Administered 2018-08-18 – 2018-08-19 (×3): 500 mg via INTRAVENOUS
  Filled 2018-08-18 (×3): qty 100

## 2018-08-18 MED ORDER — SODIUM CHLORIDE 0.9 % IV SOLN
INTRAVENOUS | Status: DC
  Administered 2018-08-18: 04:00:00 via INTRAVENOUS

## 2018-08-18 NOTE — Progress Notes (Signed)
   Subjective: No acute events overnight. She feels fine this AM. Endorses some fullness around her neck but states it is improving since last night. Denies cough, dysphagia, odynophagia, chest pain. Discussed plan to consult CT surgery.    Objective:  Vital signs in last 24 hours: Vitals:   08/17/18 2245 08/18/18 0030 08/18/18 0420 08/18/18 0423  BP: 99/73 115/77 114/68 114/68  Pulse: 64 84 84   Resp: 18 17  (!) 23  Temp: 97.8 F (36.6 C) 98.5 F (36.9 C) 98.4 F (36.9 C) 98.4 F (36.9 C)  TempSrc: Oral Oral Oral Oral  SpO2: 100% 100% 100% 100%  Weight: 60.1 kg   60.1 kg  Height: 5\' 4"  (1.626 m)      Gen: laying in bed, NAD, normal affect but became very anxious and tearful after discussing surgery consult HENT: crepitus over left clavicle  Pulm: CTAB, normal wob on RA Cardiac: tachycardic, regular rhyhtm, no m/r/g   Assessment/Plan:  Active Problems:   Pneumomediastinum ([place])   Ms. Bonnes is a 33 year old female with anxiety disorder, GERD, gastritis, IBS, history of pancreatitis, and recent admission for retroperitoneal air who presents with abdominal pain, N/V, and found to have improvement in her retroperitoneum air but newly visualized pneumomediastinum.   Pneumomediastinum, retroperitoneum air: vital signs stable.  - Ceftriaxone 2 g QD and IV Flagyl 500 mg q8h - Keep n.p.o. - Pantoprazole 40 mg IV - consult CT surgery: recommend gastrografin swallow study to r/o esophageal rupture. Keep NPO  GAD/PTSD: Followed by [facility] treatment center at [place]. Anxious appearing but overall stable. Home meds include xanax 0.5 mg PRN--given one dose today; zolpidem; Lunesta 2 mg at bedtime - ativan 0.5mg  IV q6h prn while npo, can order home po xanax once she can have po  IBS: currently symptom free. Can order bentyl for abdominal cramping if able to tolerate po  - phenergan po/iv prn nausea    Dispo: Anticipated discharge in approximately 1-3 day(s).   Isabelle Course, MD 08/18/2018, 7:53 AM Pager: [phone]

## 2018-08-18 NOTE — Consult Note (Signed)
[address]       [place] [address]             [phone]        Kerby M Parks [facility] Record #[id number] Date of Birth: [birth year]  Referring: Dr. Lynnae January, MD Primary Care: Patient, No Pcp Per  Chief Complaint:    Chief Complaint  Patient presents with   Left sided abdominal pain, nausea, and vomiting    History of Present Illness:     This is a 33 year old female with a past medical history of IBS who was recently hospitalized May 5th thru the 9th due to abdominal pain. She was on the general surgery service as she was found to have retroperitoneum free air on CT scan. Source of air is suspected to be the retroperitoneal portion of the ascending colon, however, a clear point of perforation was not identified. She underwent an upper GI on 05/07 which showed mildly prominent distal duodenum in the central abdomen, possibly reflecting adynamic small bowel ileus in the setting of pain. A GI consultation was also obtained. Follow up CT abdomen and pelvis showed diminishing pneumomediastinum, retroperitoneal air and chest and abdominal wall air. It was thought the likely source of this was in the chest related to the airway. In any case, no intraabdominal pathology. It was determined that she likely had an episode of severe retching causing retroperitoneal air creation without significant GI tract injury. She was treated with IV hydration, Flagyl and Cipro. Cipro was later changed to Ceftriaxone secondary to ADS. Per GI recommendations, an EGD and colonoscopy was to be done as an outpatient in about 6-8 weeks. Clinically she improved, was tolerating a diet, and was discharged on 08/16/2018. She then presented to [facility] ED on 05/10 with complaints of worsening left sided abdominal pain, nausea, and vomiting. Apparently, she was awakened yesterday morning with abdominal pain on the left side (LUQ and LLQ). She tried to take the prescribed medications she  was given at discharge, however, she vomited. She has had one loose stool along with preceding symptoms. CT of abdomen, pelvis, and chest done yesterday showed moderate pneumomediastinum extending into the neck and DECREASED RIGHT retroperitoneal and RIGHT lateral abdominal subcutaneous gas. This most likely represents rupture of a central airway with extension into the neck and abdomen. No evidence of pneumothorax. A swallowing study was just done and results showed no hiatal hernia and evidence of esophageal leak.  She denies fever, cough, blood in stool, dysphagia. A consultation has been requested with Dr. Servando Snare.   Current Activity/ Functional Status: Patient is independent with mobility/ambulation, transfers, ADL's, IADL's.   Zubrod Score: At the time of surgery this patients most appropriate activity status/level should be described as: []     0    Normal activity, no symptoms [x]     1    Restricted in physical strenuous activity but ambulatory, able to do out light work []     2    Ambulatory and capable of self care, unable to do work activities, up and about more than 50% of the time                            []     3    Only limited self care, in bed greater than 50% of waking hours []     4    Completely disabled, no self care, confined to  bed or chair []     5    Moribund  Past Medical History:  Diagnosis Date   Acid reflux disease    Gastritis    GERD (gastroesophageal reflux disease)    IBS (irritable bowel syndrome)    Pancreatitis     Past Surgical History:  Procedure Laterality Date   NO PAST SURGERIES      Social History   Tobacco Use  Smoking Status Current Some Day Smoker   Packs/day: 0.10   Years: 2.00   Pack years: 0.20   Types: Cigarettes, Black and Mild several days per week previously  Smokeless Tobacco Never Used    Social History   Substance and Sexual Activity  Alcohol Use Yes-socially   Comment: last drink SAT  She is single and  works for the [employer] in [place]: No Known Allergies  Current Facility-Administered Medications  Medication Dose Route Frequency Provider Last Rate Last Dose   acetaminophen (TYLENOL) tablet 650 mg  650 mg Oral Q6H PRN Chundi, Vahini, MD   650 mg at 08/17/18 2358   Or   acetaminophen (TYLENOL) suppository 650 mg  650 mg Rectal Q6H PRN Chundi, Vahini, MD       cefTRIAXone (ROCEPHIN) 2 g in sodium chloride 0.9 % 100 mL IVPB  2 g Intravenous QHS Carroll Sage, MD 200 mL/hr at 08/18/18 0337 2 g at 08/18/18 0337   LORazepam (ATIVAN) injection 0.5 mg  0.5 mg Intravenous Q6H PRN Isabelle Course, MD   0.5 mg at 08/18/18 0956   metroNIDAZOLE (FLAGYL) IVPB 500 mg  500 mg Intravenous Q8H Isabelle Course, MD       [START ON 08/19/2018] pantoprazole (PROTONIX) injection 40 mg  40 mg Intravenous Q24H Nita Sickle M, MD       potassium chloride 10 mEq in 100 mL IVPB  10 mEq Intravenous Q1 Hr x 4 Isabelle Course, MD 100 mL/hr at 08/18/18 1240 10 mEq at 08/18/18 1240   promethazine (PHENERGAN) injection 12.5 mg  12.5 mg Intravenous Q6H PRN Isabelle Course, MD       Or   promethazine (PHENERGAN) tablet 12.5 mg  12.5 mg Oral Q6H PRN Isabelle Course, MD       senna-docusate (Senokot-S) tablet 1 tablet  1 tablet Oral QHS PRN Lars Mage, MD        Medications Prior to Admission  Medication Sig Dispense Refill Last Dose   acetaminophen (TYLENOL) 500 MG tablet Take 500 mg by mouth every 6 (six) hours as needed for mild pain.   08/17/2018 at Unknown time   ALPRAZolam (XANAX) 0.5 MG tablet Take 0.25-0.5 mg by mouth every 6 (six) hours as needed for anxiety.    Past Month at Unknown time   diphenhydrAMINE (BENADRYL) 25 MG tablet Take 25 mg by mouth at bedtime as needed for sleep.   08/16/2018 at Unknown time   eszopiclone (LUNESTA) 2 MG TABS tablet Take 2 mg by mouth at bedtime.   Past Week at Unknown time   ondansetron (ZOFRAN-ODT) 4 MG disintegrating tablet Take 1 tablet (4 mg total) by mouth every  6 (six) hours as needed for nausea. 20 tablet 0 08/17/2018 at Unknown time   oxyCODONE (OXY IR/ROXICODONE) 5 MG immediate release tablet Take 1 tablet (5 mg total) by mouth every 6 (six) hours as needed for severe pain. 15 tablet 0 08/17/2018 at Unknown time   pantoprazole (PROTONIX) 20 MG tablet Take 1 tablet (20 mg total) by  mouth daily. 30 tablet 1 08/17/2018 at Unknown time   capsaicin (ZOSTRIX) 0.025 % cream Apply topically daily. 60 g 0    dicyclomine (BENTYL) 20 MG tablet Take 1 tablet (20 mg total) by mouth 2 (two) times daily. (Patient not taking: Reported on 08/13/2018) 20 tablet 0 Not Taking at Unknown time   polyethylene glycol (MIRALAX) packet Take 17 g by mouth daily. (Patient not taking: Reported on 08/13/2018) 14 each 0 Not Taking at Unknown time   promethazine (PHENERGAN) 25 MG suppository Place 1 suppository (25 mg total) rectally every 6 (six) hours as needed for nausea or vomiting. (Patient not taking: Reported on 08/13/2018) 12 suppository 0 Not Taking at Unknown time   sucralfate (CARAFATE) 1 G tablet Take 1 tablet (1 g total) by mouth 4 (four) times daily. (Patient not taking: Reported on 08/13/2018) 40 tablet 0 Not Taking at Unknown time   Family History: She states both her mom and dad have IBS and uncle (dad's brother) has Crohn's.   Review of Systems:      General Review of Systems: [Y] = yes [ N ]=no ConstitiNonal:  anorexia [ Y ];nausea [  Y]; night sweats [ N ]; fever [N  ]; or chills Aqua.Slicker  ]                                                            Eye : Amaurosis fugax[ N ]; Resp: cough Aqua.Slicker  ];  wheezing[ N ];  hemoptysis[ N ]; shortness of breath[ N ];  GI: vomiting[Y  ]; melena[ N ];  hematochezia Aqua.Slicker  ];               Skin: rash, swelling[N  ]; peripheral edema[ N ];    Heme/Lymph:  anemia[ Y ];  Neuro: TIA[  N];   stroke[N  ];   seizures[N  ];     Psych:depression[N  ]; anxiety[Y  ];  Endocrine: diabetes[ N ];  thyroid dysfunction[ N ];               Physical  Exam: BP (!) 129/92    Pulse (!) 37    Temp 98.4 F (36.9 C) (Oral)    Resp 19    Ht 5\' 4"  (1.626 m)    Wt 60.1 kg    LMP 08/10/2018 Comment: neg preg test 08/13/2018   SpO2 99%    BMI 22.74 kg/m    General appearance: She is in NAD, under the covers, and states she is "tired" Head: Normocephalic, without obvious abnormality, atraumatic Neck: supple, symmetrical, trachea midline Resp: clear to auscultation bilaterally, some crepitus left chest Cardio: RRR, no murmur Extremities: No LE edema.  Neurologic: Grossly normal  Diagnostic Studies & Laboratory data:     Recent Radiology Findings:   Ct Chest W Contrast  Result Date: 08/17/2018 CLINICAL DATA:  33 year old female with acute chest, abdominal and pelvic pain. Recent pneumomediastinum and retroperitoneal air. EXAM: CT CHEST, ABDOMEN, AND PELVIS WITH CONTRAST TECHNIQUE: Multidetector CT imaging of the chest, abdomen and pelvis was performed following the standard protocol during bolus administration of intravenous contrast. CONTRAST:  139mL OMNIPAQUE IOHEXOL 300 MG/ML  SOLN COMPARISON:  08/15/2018 abdominal and pelvic CT. FINDINGS: CT CHEST FINDINGS Cardiovascular: The heart and great vessels are unremarkable. No thoracic  aortic aneurysm or pericardial effusion. Mediastinum/Nodes: A moderate amount of pneumomediastinum is identified centered in the UPPER chest and extending into the neck. No mediastinal mass. No esophageal or thyroid abnormalities identified. Lungs/Pleura: There is no evidence of pneumothorax. No tracheal or bronchial wall defect identified. There is no evidence of airspace disease, nodule, mass or pleural effusion. Musculoskeletal: No acute or suspicious bony abnormalities identified. 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 No biliary dilatation. Pancreas:  Unremarkable Spleen: Unremarkable Adrenals/Urinary Tract: The kidneys, adrenal glands and bladder are unremarkable. Stomach/Bowel: Stomach is within normal limits. No evidence of bowel wall thickening, distention, or inflammatory changes. Vascular/Lymphatic: No significant vascular findings are present. No enlarged abdominal or pelvic lymph nodes. Reproductive: Uterus and bilateral adnexa are unremarkable. Other: Gas in the RIGHT retroperitoneum and RIGHT LATERAL subcutaneous tissues have decreased since 08/15/2018. No ascites, focal collection or pneumoperitoneum. Musculoskeletal: No acute or suspicious bony abnormalities. IMPRESSION: 1. Moderate pneumomediastinum extending into the neck and DECREASED RIGHT retroperitoneal and RIGHT lateral abdominal subcutaneous gas. This most likely represents rupture of a central airway with extension into the neck and abdomen. No evidence of pneumothorax. Consider clinical follow-up. 2. No other acute or significant abnormalities. Electronically Signed   By: Margarette Canada M.D.   On: 08/17/2018 17:06   Ct Abdomen Pelvis W Contrast  Result Date: 08/17/2018 CLINICAL DATA:  33 year old female with acute chest, abdominal and pelvic pain. Recent pneumomediastinum and retroperitoneal air. EXAM: CT CHEST, ABDOMEN, AND PELVIS WITH CONTRAST TECHNIQUE: Multidetector CT imaging of the chest, abdomen and pelvis was performed following the standard protocol during bolus administration of intravenous contrast. CONTRAST:  114mL OMNIPAQUE IOHEXOL 300 MG/ML  SOLN COMPARISON:  08/15/2018 abdominal and pelvic CT. FINDINGS: CT CHEST FINDINGS Cardiovascular: The heart and great vessels are unremarkable. No thoracic aortic aneurysm or pericardial effusion. Mediastinum/Nodes: A moderate amount of pneumomediastinum is identified centered in the UPPER chest and extending into the neck. No mediastinal mass. No esophageal or thyroid abnormalities identified. Lungs/Pleura: There is no evidence of  pneumothorax. No tracheal or bronchial wall defect identified. There is no evidence of airspace disease, nodule, mass or pleural effusion. Musculoskeletal: No acute or suspicious bony abnormalities identified. 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 No biliary dilatation. Pancreas: Unremarkable Spleen: Unremarkable Adrenals/Urinary Tract: The kidneys, adrenal glands and bladder are unremarkable. Stomach/Bowel: Stomach is within normal limits. No evidence of bowel wall thickening, distention, or inflammatory changes. Vascular/Lymphatic: No significant vascular findings are present. No enlarged abdominal or pelvic lymph nodes. Reproductive: Uterus and bilateral adnexa are unremarkable. Other: Gas in the RIGHT retroperitoneum and RIGHT LATERAL subcutaneous tissues have decreased since 08/15/2018. No ascites, focal collection or pneumoperitoneum. Musculoskeletal: No acute or suspicious bony abnormalities. IMPRESSION: 1. Moderate pneumomediastinum extending into the neck and DECREASED RIGHT retroperitoneal and RIGHT lateral abdominal subcutaneous gas. This most likely represents rupture of a central airway with extension into the neck and abdomen. No evidence of pneumothorax. Consider clinical follow-up. 2. No other acute or significant abnormalities. Electronically Signed   By: Margarette Canada M.D.   On: 08/17/2018 17:06   Dg Esophagus W Single Cm (sol Or Thin Ba)  Result Date: 08/18/2018 CLINICAL DATA:  Pneumomediastinum.  Evaluate for esophageal rupture. EXAM: ESOPHOGRAM/BARIUM SWALLOW TECHNIQUE: Single contrast examination was  performed using water-soluble contrast. FLUOROSCOPY TIME:  Fluoroscopy Time:  48 seconds Radiation Exposure Index (if provided by the fluoroscopic device): 2.4 mGy Number of Acquired Spot Images: 0 COMPARISON:  CT chest dated Aug 17, 2018. FINDINGS: The pharyngeal phase of swallowing was normal. Primary peristaltic waves in the esophagus were normal. No obstruction to the forward flow of contrast throughout the esophagus and into the stomach. Normal esophageal course and contour. Normal esophageal mucosal pattern. No esophageal stricture, ulceration, leak, or other significant abnormality. No hiatal hernia. No gastroesophageal reflux occurred spontaneously or was elicited. IMPRESSION: 1. Normal esophagram.  No evidence of leak. Electronically Signed   By: Titus Dubin M.D.   On: 08/18/2018 12:38     I have independently reviewed the above radiology studies  and reviewed the findings with the patient.   Recent Lab Findings: Lab Results  Component Value Date   WBC 11.1 (H) 08/18/2018   HGB 10.5 (L) 08/18/2018   HCT 30.6 (L) 08/18/2018   PLT 178 08/18/2018   GLUCOSE 83 08/18/2018   ALT 21 08/18/2018   AST 20 08/18/2018   NA 136 08/18/2018   K 2.7 (LL) 08/18/2018   CL 100 08/18/2018   CREATININE 0.87 08/18/2018   BUN 8 08/18/2018   CO2 23 08/18/2018   INR 1.08 04/02/2009   Assessment / Plan:   1. Pneumomediastinum, retroperitoneal air-? Etiology. DG esophagus showed normal esophagram, no evidence of esophageal leak and no hiatal hernia. No need for thoracic intervention at this time. She may need GI re consulted but will discuss with Dr. Servando Snare. Continue Ceftriaxone and Metronidazole.  2. GAD/PTSD-on Alprazolam PRN prior to admission 3. IBS-on Dicyclomine prior to admission   I  spent 10 minutes counseling the patient face to face.   Lars Pinks PA-C 08/18/2018 12:51 PM   Patient seen , swallow reviewed , no esophageal perforation  May have liquids , advance per medical service depending on abdominal symptoms No indication for chest surgery currently I have seen and examined Tressy M Borghi and agree with the above assessment  and plan.  Grace Isaac MD [facility] [phone] Office  [phone] 08/18/2018 7:09 PM

## 2018-08-18 NOTE — Progress Notes (Signed)
Patient very tearful this AM. Stating "she is scared and has anxiety" Dr. Donne Hazel made aware and orders received will monitor patient. Tydarius Yawn, Bettina Gavia rN

## 2018-08-18 NOTE — Plan of Care (Signed)
POC initiated and progressing. 

## 2018-08-19 DIAGNOSIS — Z5329 Procedure and treatment not carried out because of patient's decision for other reasons: Secondary | ICD-10-CM

## 2018-08-19 DIAGNOSIS — F419 Anxiety disorder, unspecified: Secondary | ICD-10-CM

## 2018-08-19 DIAGNOSIS — K2970 Gastritis, unspecified, without bleeding: Secondary | ICD-10-CM | POA: Diagnosis not present

## 2018-08-19 DIAGNOSIS — K219 Gastro-esophageal reflux disease without esophagitis: Secondary | ICD-10-CM | POA: Diagnosis not present

## 2018-08-19 DIAGNOSIS — K589 Irritable bowel syndrome without diarrhea: Secondary | ICD-10-CM | POA: Diagnosis not present

## 2018-08-19 DIAGNOSIS — J982 Interstitial emphysema: Secondary | ICD-10-CM | POA: Diagnosis not present

## 2018-08-19 LAB — CBC
HCT: 31.3 % — ABNORMAL LOW (ref 36.0–46.0)
Hemoglobin: 10.7 g/dL — ABNORMAL LOW (ref 12.0–15.0)
MCH: 31 pg (ref 26.0–34.0)
MCHC: 34.2 g/dL (ref 30.0–36.0)
MCV: 90.7 fL (ref 80.0–100.0)
Platelets: 203 10*3/uL (ref 150–400)
RBC: 3.45 MIL/uL — ABNORMAL LOW (ref 3.87–5.11)
RDW: 12.7 % (ref 11.5–15.5)
WBC: 7.9 10*3/uL (ref 4.0–10.5)
nRBC: 0 % (ref 0.0–0.2)

## 2018-08-19 LAB — BASIC METABOLIC PANEL
Anion gap: 8 (ref 5–15)
BUN: <5 mg/dL — ABNORMAL LOW (ref 6–20)
CO2: 25 mmol/L (ref 22–32)
Calcium: 8.4 mg/dL — ABNORMAL LOW (ref 8.9–10.3)
Chloride: 104 mmol/L (ref 98–111)
Creatinine, Ser: 0.84 mg/dL (ref 0.44–1.00)
GFR calc Af Amer: >60 mL/min (ref >60)
GFR calc non Af Amer: >60 mL/min (ref >60)
Glucose, Bld: 126 mg/dL — ABNORMAL HIGH (ref 70–99)
Potassium: 3.1 mmol/L — ABNORMAL LOW (ref 3.5–5.1)
Sodium: 137 mmol/L (ref 135–145)

## 2018-08-19 MED ORDER — ENOXAPARIN SODIUM 40 MG/0.4ML ~~LOC~~ SOLN: 40.0000 mg | SUBCUTANEOUS | Status: DC

## 2018-08-19 NOTE — Progress Notes (Signed)
   Subjective:  No acute events overnight.  Patient is feeling much better today.She appears upset and feels like the team is not doing anything to help her. States she is loosing money and has responsibilities at home. She plans to leave tonight if she is not discharged.   Objective:  Vital signs in last 24 hours: Vitals:   08/18/18 1500 08/18/18 1700 08/18/18 1751 08/18/18 2043  BP:   109/74 126/84  Pulse:  96 88 77  Resp: 14 19 17 12   Temp:   97.9 F (36.6 C) 98.3 F (36.8 C)  TempSrc:   Oral Oral  SpO2:  100% 100% 100%  Weight:      Height:       Constitutional: Young, well-appearing female lying in bed in no acute distress Neck: crepitus over L clavicle  CV: Regular rate and rhythm with no murmurs, rubs, or gallops Pulm: Clear to auscultation bilaterally, no wheezes or crackles, no increased work of breathing  Assessment/Plan:  Active Problems:   Pneumomediastinum (HCC)  # Pneumomediastinum: Unclear etiology. Patient remains stable from a respiratory standpoint.  She was evaluated by CVTS who recommended no intervention at this time.  Plan was to consult GI to discuss other potential interventions, but patient wanted to go home and stated she was leaving AMA. I discussed risks of going home including pneumomediastinum and death if her condition were to worsen. She verbalized understanding and stated she wanted to go home. Patient was discharged.  - Advised to complete 10-day antibiotic course prescribed on 5/9 - Advised to follow-up with GI in 2 weeks, has an appointment on 5/26  Dispo: Anticipated discharge today.   Welford Roche, MD 08/19/2018, 6:00 AM Pager: [phone]

## 2018-08-19 NOTE — Discharge Summary (Signed)
Patient Left AMA  Name: Samantha Noble MRN: [id number] DOB: 1986/04/09 33 y.o. PCP: Patient, No Pcp Per  Date of Admission: 08/17/2018  3:07 PM Date of Discharge: 08/19/2018 Attending Physician: Larey Dresser   Discharge Diagnosis: 1. Pneumomediastinum  Discharge Medications: Allergies as of 08/19/2018   No Known Allergies     Medication List    STOP taking these medications   dicyclomine 20 MG tablet Commonly known as:  BENTYL   polyethylene glycol 17 g packet Commonly known as:  MiraLax   promethazine 25 MG suppository Commonly known as:  PHENERGAN   sucralfate 1 g tablet Commonly known as:  Carafate     TAKE these medications   acetaminophen 500 MG tablet Commonly known as:  TYLENOL Take 500 mg by mouth every 6 (six) hours as needed for mild pain.   ALPRAZolam 0.5 MG tablet Commonly known as:  XANAX Take 0.25-0.5 mg by mouth every 6 (six) hours as needed for anxiety.   capsaicin 0.025 % cream Commonly known as:  ZOSTRIX Apply topically daily.   diphenhydrAMINE 25 MG tablet Commonly known as:  BENADRYL Take 25 mg by mouth at bedtime as needed for sleep.   eszopiclone 2 MG Tabs tablet Commonly known as:  LUNESTA Take 2 mg by mouth at bedtime.   ondansetron 4 MG disintegrating tablet Commonly known as:  ZOFRAN-ODT Take 1 tablet (4 mg total) by mouth every 6 (six) hours as needed for nausea.   oxyCODONE 5 MG immediate release tablet Commonly known as:  Oxy IR/ROXICODONE Take 1 tablet (5 mg total) by mouth every 6 (six) hours as needed for severe pain.   pantoprazole 20 MG tablet Commonly known as:  Protonix Take 1 tablet (20 mg total) by mouth daily.       Disposition and follow-up:   Samantha Noble was discharged from [facility] in Stable condition.  At the hospital follow up visit please address:  1.  Needs GI f/u for outpt colonoscopy and EGD  2.  Labs / imaging needed at time of follow-up: colonoscopy and  EGD  3.  Pending labs/ test needing follow-up: none   Follow-up Appointments: Instructed to go to already scheduled GI appt  Hospital Course by problem list:   Samantha Noble is a 33 year old female with anxiety disorder, GERD, gastritis, IBS, history of pancreatitis, and recent admission for retroperitoneal air who presents with abdominal pain,N/V, and found to have improvement in her retroperitoneum airbutnewlyvisualized pneumomediastinum.  # Pneumomediastinum: Unclear etiology. She was treated with ceftriaxone and metronidazole while inpatient and instructed to complete the original 10-day antibiotic course prescribed on 5/9 after this hospital discharge. Patient remains stable from a respiratory standpoint.  She was evaluated by CVTS who recommended no intervention at this time.  Plan was to consult GI to discuss other potential interventions, but patient wanted to go home and stated she was leaving AMA. I discussed risks of going home including pneumomediastinum and death if her condition were to worsen. She verbalized understanding and stated she wanted to go home. Patient was discharged.  - Advised to follow-up with GI in 2 weeks, has an appointment on 5/26  Discharge Vitals:   BP (!) 142/95 (BP Location: Right Arm)    Pulse 66    Temp (!) 97.4 F (36.3 C) (Oral)    Resp 15    Ht 5\' 4"  (1.626 m)    Wt 61.3 kg    LMP 08/10/2018 Comment: neg  preg test 08/13/2018   SpO2 100%    BMI 23.21 kg/m   Pertinent Labs, Studies, and Procedures:   CLINICAL DATA:  33 year old female with acute chest, abdominal and pelvic pain. Recent pneumomediastinum and retroperitoneal air.  EXAM: CT CHEST, ABDOMEN, AND PELVIS WITH CONTRAST  TECHNIQUE: Multidetector CT imaging of the chest, abdomen and pelvis was performed following the standard protocol during bolus administration of intravenous contrast.  CONTRAST:  110mL OMNIPAQUE IOHEXOL 300 MG/ML  SOLN  COMPARISON:  08/15/2018 abdominal and  pelvic CT.  FINDINGS: CT CHEST FINDINGS  Cardiovascular: The heart and great vessels are unremarkable. No thoracic aortic aneurysm or pericardial effusion.  Mediastinum/Nodes: A moderate amount of pneumomediastinum is identified centered in the UPPER chest and extending into the neck. No mediastinal mass. No esophageal or thyroid abnormalities identified.  Lungs/Pleura: There is no evidence of pneumothorax. No tracheal or bronchial wall defect identified. There is no evidence of airspace disease, nodule, mass or pleural effusion.  Musculoskeletal: No acute or suspicious bony abnormalities identified. 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 No biliary dilatation.  Pancreas: Unremarkable  Spleen: Unremarkable  Adrenals/Urinary Tract: The kidneys, adrenal glands and bladder are unremarkable.  Stomach/Bowel: Stomach is within normal limits. No evidence of bowel wall thickening, distention, or inflammatory changes.  Vascular/Lymphatic: No significant vascular findings are present. No enlarged abdominal or pelvic lymph nodes.  Reproductive: Uterus and bilateral adnexa are unremarkable.  Other: Gas in the RIGHT retroperitoneum and RIGHT LATERAL subcutaneous tissues have decreased since 08/15/2018. No ascites, focal collection or pneumoperitoneum.  Musculoskeletal: No acute or suspicious bony abnormalities.  IMPRESSION: 1. Moderate pneumomediastinum extending into the neck and DECREASED RIGHT retroperitoneal and RIGHT lateral abdominal subcutaneous gas. This most likely represents rupture of a central airway with extension into the neck and abdomen. No evidence of pneumothorax. Consider clinical follow-up. 2. No other acute or significant abnormalities.   CLINICAL DATA:   Pneumomediastinum.  Evaluate for esophageal rupture.  EXAM: ESOPHOGRAM/BARIUM SWALLOW  TECHNIQUE: Single contrast examination was performed using water-soluble contrast.  FLUOROSCOPY TIME:  Fluoroscopy Time:  48 seconds  Radiation Exposure Index (if provided by the fluoroscopic device): 2.4 mGy  Number of Acquired Spot Images: 0  COMPARISON:  CT chest dated Aug 17, 2018.  FINDINGS: The pharyngeal phase of swallowing was normal.  Primary peristaltic waves in the esophagus were normal. No obstruction to the forward flow of contrast throughout the esophagus and into the stomach.  Normal esophageal course and contour. Normal esophageal mucosal pattern. No esophageal stricture, ulceration, leak, or other significant abnormality.  No hiatal hernia. No gastroesophageal reflux occurred spontaneously or was elicited.  IMPRESSION: 1. Normal esophagram.  No evidence of leak.  Discharge Instructions: Discharge Instructions    Call MD for:  difficulty breathing, headache or visual disturbances   Complete by:  As directed    Call MD for:  persistant nausea and vomiting   Complete by:  As directed    Call MD for:  severe uncontrolled pain   Complete by:  As directed    Call MD for:  temperature >100.4   Complete by:  As directed    Discharge instructions   Complete by:  As directed    Samantha Noble,   Please make sure to follow up with GI doctors in 2 weeks. They will likely repeat imaging. Please finish the course of antibiotics  that was prescribed to you when you were discharged on 5/9.  Please return to the ED if you start to experience chest pain, shortness of breath, or nausea vomiting that want go away.  Please call us if you have any questions.   Increase activity slowly   Complete by:  As directed       Signed: Isabelle Course, MD 08/20/2018, 11:26 AM   Pager: [phone]

## 2018-08-19 NOTE — Progress Notes (Signed)
Patient very upset wants to leave. Refused Lovenox injection states "that we are not doing anything for her and she is missing money. " Residents paged.

## 2018-08-19 NOTE — Progress Notes (Signed)
Patient with discharge orders home. IV removed with catheter intact. Telemetry removed earlier by MD. Personal belongings to include cell phone packed by patient.  No c/o of pain at this time very excited about going home. Family called.

## 2018-09-03 ENCOUNTER — Other Ambulatory Visit: Payer: Self-pay

## 2018-09-03 ENCOUNTER — Telehealth (INDEPENDENT_AMBULATORY_CARE_PROVIDER_SITE_OTHER): Payer: 59 | Admitting: Gastroenterology

## 2018-09-03 ENCOUNTER — Encounter: Payer: Self-pay | Admitting: Gastroenterology

## 2018-09-03 VITALS — Ht 64.0 in | Wt 135.0 lb

## 2018-09-03 DIAGNOSIS — R109 Unspecified abdominal pain: Secondary | ICD-10-CM | POA: Diagnosis not present

## 2018-09-03 DIAGNOSIS — R110 Nausea: Secondary | ICD-10-CM

## 2018-09-03 DIAGNOSIS — K668 Other specified disorders of peritoneum: Secondary | ICD-10-CM

## 2018-09-03 DIAGNOSIS — K581 Irritable bowel syndrome with constipation: Secondary | ICD-10-CM

## 2018-09-03 DIAGNOSIS — J982 Interstitial emphysema: Secondary | ICD-10-CM

## 2018-09-03 MED ORDER — DICYCLOMINE HCL 10 MG PO CAPS: 10.0000 mg | ORAL_CAPSULE | Freq: Four times a day (QID) | ORAL | 3 refills | Status: DC | PRN

## 2018-09-03 NOTE — Patient Instructions (Addendum)
If you are age 31 or older, your body mass index should be between 23-30. Your Body mass index is 23.17 kg/m. If this is out of the aforementioned range listed, please consider follow up with your Primary Care Provider.  If you are age 65 or younger, your body mass index should be between 19-25. Your Body mass index is 23.17 kg/m. If this is out of the aformentioned range listed, please consider follow up with your Primary Care Provider.   We have sent the following medications to your pharmacy for you to pick up at your convenience: Bentyl  It has been recommended to you by your physician that you have a(n) EGD/Colon completed. We did not schedule the procedure(s) today. Please contact our office in July at  [phone] to have the procedure completed.  Follow up 3-6 months.    It was a pleasure to see you today!  Vito Cirigliano, D.O.

## 2018-09-03 NOTE — Progress Notes (Signed)
Chief Complaint: Hospital follow-up, Pneumomediastinum, Pneumoperitoneum  Referring Provider:     Erroll Luna, MD (inpatient General Surgery)   HPI:    Due to current restrictions/limitations of in-office visits due to the COVID-19 pandemic, this scheduled clinical appointment was converted to a telehealth virtual consultation using Doximity.  -Time of medical discussion: 25 minutes -The patient did consent to this virtual visit and is aware of possible charges through their insurance for this visit.  -Names of all parties present: Samantha Noble (patient), Gerrit Heck, DO, [facility] (physician) -Patient location: Home -Physician location: Office  Samantha Noble is a 33 y.o. female with a history of anxiety disorder, GERD, gastritis, IBS, history of pancreatitis, marijuana use, presenting to the [facility]ology [facility] for hospital follow-up.  She was admitted 5/5-9 for abdominal pain with admission CT notable for retroperitoneal free air.  Extended evaluation unrevealing for etiology (UGI series, serial CTs, serial exams).  Treated with IVF and IV ABX.  GI was consulted, and decision made to hold off on endoscopic procedures given clinical stability and concern to exacerbate free air.  Similarly, ex lap not performed, and she continued to improve clinically and tolerating p.o. well at time of discharge.  Plan was for EGD/colonoscopy 8 weeks post discharge.  She was readmitted 5/10-12 (left AMA) with left-sided abdominal pain, and/V and admission CT n/f pneumomediastinum, with decreased right retroperitoneal/abdominal subcutaneous gas, with concern for rupture of central airway.  CT surgery consulted and treated conservatively with IV ABX.  GI consulted but she left AMA prior to evaluation.  Labs largely unrevealing, to include normal CBC, CMP, amylase, lipase, PTT, CRP.  Today she states she feels back to baseline, just mild intermittent cramping a couple days ago. Taking  pantoprazole as prescribed and Zofran prn. Went hiking over weekend w/o issue.  No pulmonary symptoms. Tolerating all PO intake.   Hx of IBS-C. Cramping previously well controlled with intemrittent use of Bentyl- has none left now though. Otherwise, does well with dietary mods and avoiding trigger type foods. Does know sxs worse with anxiety. Can also have nausea with abd cramping. Sxs present for at least 5 years.  No previous EGD or colonoscopy.  Evaluation to date: - CT (08/13/2018): Free air in right retroperitoneum, with source suspected to be retroperitoneal portion of a sending colon, but no clear point perforation identified.  Mild ascending/transverse colitis - UGI series (08/14/2018): Normal stomach, mildly prominent distal duodenum.  No extravasation - CT (08/15/2018): Diminished pneumomediastinum, retroperitoneal air and chest/abdominal wall air.  Etiology felt to be chest related.  No intra-abdominal pathology - CT C/A/P (08/17/2018): Moderate pneumomediastinum extending into the neck and decreased right retroperitoneal/right lateral abdominal subcutaneous gas.  Likely represents rupture of a central airway with extension into the neck and abdomen.  No pneumothorax -Barium esophagram (08/18/2018): Normal.  No leak.  No HH or reflux  No known family history of CRC, GI malignancy, liver disease, pancreatic disease, or IBD.   Past medical history, past surgical history, social history, family history, medications, and allergies reviewed in the chart and with patient.    Past Medical History:  Diagnosis Date  . Acid reflux disease   . Gastritis   . GERD (gastroesophageal reflux disease)   . IBS (irritable bowel syndrome)   . Pancreatitis      Past Surgical History:  Procedure Laterality Date  . NO PAST SURGERIES     Family History  Problem Relation  Age of Onset  . Breast cancer Mother   . Irritable bowel syndrome Maternal Grandmother   . Crohn's disease Paternal Uncle   . Colon  cancer Neg Hx   . Esophageal cancer Neg Hx    Social History   Tobacco Use  . Smoking status: Current Some Day Smoker    Packs/day: 0.10    Years: 2.00    Pack years: 0.20    Types: Cigarettes  . Smokeless tobacco: Never Used  Substance Use Topics  . Alcohol use: Not Currently    Comment: 08/04/2018 was the last day she dranked   . Drug use: Yes    Types: Marijuana   Current Outpatient Medications  Medication Sig Dispense Refill  . acetaminophen (TYLENOL) 500 MG tablet Take 500 mg by mouth every 6 (six) hours as needed for mild pain.    Marland Kitchen ALPRAZolam (XANAX) 0.5 MG tablet Take 0.25-0.5 mg by mouth every 6 (six) hours as needed for anxiety.     . eszopiclone (LUNESTA) 2 MG TABS tablet Take 2 mg by mouth at bedtime.    Donnetta Hail Estradiol (ESTARYLLA PO) Take 1 tablet by mouth daily.    . pantoprazole (PROTONIX) 20 MG tablet Take 1 tablet (20 mg total) by mouth daily. 30 tablet 1  . diphenhydrAMINE (BENADRYL) 25 MG tablet Take 25 mg by mouth at bedtime as needed for sleep.    Marland Kitchen ondansetron (ZOFRAN-ODT) 4 MG disintegrating tablet Take 1 tablet (4 mg total) by mouth every 6 (six) hours as needed for nausea. (Patient not taking: Reported on 09/03/2018) 20 tablet 0   No current facility-administered medications for this visit.    Allergies  Allergen Reactions  . Zostrix [Capsaicin]     Burned patient skin     Review of Systems: All systems reviewed and negative except where noted in HPI.     Physical Exam:    Physical exam not completed due to the nature of this telehealth communication.  Patient was otherwise alert and oriented and well communicative.   ASSESSMENT AND PLAN;   1) Pneumoperitoneum 2) Pneumomediastinum 3) Abdominal cramping 4) IBS-C 5) Nausea  - Plan for EGD/colonoscopy in mid July to evaluate for luminal/mucosal etiology for recent retroperitoneal free air and subsequent admission for pneumomediastinum.  Discussed possible GI etiologies, to  include IBD, Boerhaave's, PUD, etc. -Resume Protonix as prescribed for empiric treatment of possible gastritis/PUD - Tolerating p.o. intake and all activity without issue - No active pulmonary symptoms - Resume dietary modifications for history of IBS - Given Rx for Bentyl given previous good response.  Did discuss long-term treatment strategies pending response to therapy and control of symptoms, along with endoscopic exam findings -Okay to resume Zofran as needed for now -Quit cannabis use - Otherwise normal WBC, inflammatory markers on recent admission -RTC in 3 6 months or sooner as needed  The indications, risks, and benefits of EGD and colonoscopy were explained to the patient in detail. Risks include but are not limited to bleeding, perforation, adverse reaction to medications, and cardiopulmonary compromise. Sequelae include but are not limited to the possibility of surgery, hositalization, and mortality. The patient verbalized understanding and wished to proceed. All questions answered, referred to scheduler and bowel prep ordered. Further recommendations pending results of the exam.     Leslie, DO, FACG  09/03/2018, 8:27 AM   No ref. provider found

## 2018-11-07 ENCOUNTER — Encounter: Payer: Self-pay | Admitting: Gastroenterology

## 2018-12-02 ENCOUNTER — Telehealth (INDEPENDENT_AMBULATORY_CARE_PROVIDER_SITE_OTHER): Payer: 59 | Admitting: Gastroenterology

## 2018-12-02 ENCOUNTER — Other Ambulatory Visit: Payer: Self-pay

## 2018-12-02 VITALS — Ht 64.0 in | Wt 145.0 lb

## 2018-12-02 DIAGNOSIS — K581 Irritable bowel syndrome with constipation: Secondary | ICD-10-CM

## 2018-12-02 DIAGNOSIS — J982 Interstitial emphysema: Secondary | ICD-10-CM | POA: Diagnosis not present

## 2018-12-02 DIAGNOSIS — K668 Other specified disorders of peritoneum: Secondary | ICD-10-CM | POA: Diagnosis not present

## 2018-12-02 DIAGNOSIS — R109 Unspecified abdominal pain: Secondary | ICD-10-CM | POA: Diagnosis not present

## 2018-12-02 NOTE — Patient Instructions (Addendum)
If you are age 31 or older, your body mass index should be between 23-30. Your Body mass index is 24.89 kg/m. If this is out of the aforementioned range listed, please consider follow up with your Primary Care Provider.  If you are age 8 or younger, your body mass index should be between 19-25. Your Body mass index is 24.89 kg/m. If this is out of the aformentioned range listed, please consider follow up with your Primary Care Provider.   It has been recommended to you by your physician that you have a(n) EGD/Colon completed. Per your request, we did not schedule the procedure(s) today. Please contact our office at [phone] should you decide to have the procedure completed.   It was a pleasure to see you today!  Vito Cirigliano, D.O.

## 2018-12-02 NOTE — Progress Notes (Signed)
Chief Complaint: Pneumomediastinum, pneumoperitoneum  GI History: 33 y.o. female with a history of anxiety disorder, GERD, gastritis, IBS, history of pancreatitis, marijuana use, admitted 5/5/9 with retroperitoneal free air on CT.  Extended evaluation unrevealing for etiology (UGI series, serial CTs, serial exams).  Treated with IVF and IV ABX.  GI was consulted, and decision made to hold off on endoscopic procedures given clinical stability and concern to exacerbate free air.  Similarly, ex lap not performed, and she continued to improve clinically and tolerating p.o. well at time of discharge.  Plan was for EGD/colonoscopy 8 weeks post discharge.  She was readmitted 5/10-12 (left AMA) with left-sided abdominal pain, N/V and admission CT n/f pneumomediastinum, with decreased right retroperitoneal/abdominal subcutaneous gas, with concern for rupture of central airway.  CT surgery consulted and treated conservatively with IV ABX.  GI consulted but she left AMA prior to evaluation.  Labs largely unrevealing, to include normal CBC, CMP, amylase, lipase, PTT, CRP.  Seen in f/u on 09/03/18. Plan at that time was for EGD/Colo in July, which she had not scheduled.   Evaluation to date: - CT (08/13/2018): Free air in right retroperitoneum, with source suspected to be retroperitoneal portion of a sending colon, but no clear point perforation identified.  Mild ascending/transverse colitis - UGI series (08/14/2018): Normal stomach, mildly prominent distal duodenum.  No extravasation - CT (08/15/2018): Diminished pneumomediastinum, retroperitoneal air and chest/abdominal wall air.  Etiology felt to be chest related.  No intra-abdominal pathology - CT C/A/P (08/17/2018): Moderate pneumomediastinum extending into the neck and decreased right retroperitoneal/right lateral abdominal subcutaneous gas.  Likely represents rupture of a central airway with extension into the neck and abdomen.  No pneumothorax  -Barium esophagram (08/18/2018): Normal.  No leak.  No HH or reflux  Separately, she has a Hx of IBS-C. Cramping previously well controlled with intemrittent use of Bentyl. Otherwise, does well with dietary mods and avoiding trigger type foods. Sxs worse with anxiety. Can also have nausea with abd cramping. Sxs present for at least 5 years.  No previous EGD or colonoscopy.  No known family history of CRC, GI malignancy, liver disease, pancreatic disease, or IBD.   HPI:    Due to current restrictions/limitations of in-office visits due to the COVID-19 pandemic, this scheduled clinical appointment was converted to a telehealth virtual consultation using Doximity.  -Time of medical discussion: 21 minutes -The patient did consent to this virtual visit and is aware of possible charges through their insurance for this visit.  -Names of all parties present: Samantha Noble (patient), Gerrit Heck, DO, [facility] (physician) -Patient location: Home -Physician location: Office  Samantha Noble is a 33 y.o. female referred to the Gastroenterology Clinic for follow-up. She was last seen on 09/03/18 following hospital admission for pneumomediastinum/pneumoperitoneum as outlined above. She was w/o any GI sxs at that time, tolerating all PO intake. Plan at that time was for EGD/Colo in July, which she had not scheduled. Resumed Protonix for possible gastritis/PUD and Bentyl for IBS. Recommended that she quit cannabis.   No new labs or imagined.   Today, she states she feels well. No complaints today. Some mild DOE 3 weeks ago which lasted 3 days then resolved. No fever, chillsm cough, headache, CP. No n/v/d/c. Now back to baseline. Tolerating all PO intake. Occasional lower abdominal cramping which resolves with BM. This has been present for years (hx of IBS-C). No hematochezia. Take Bentyl prn  which resolves cramping.   Past medical history, past surgical history, social history, family history,  medications, and allergies reviewed in the chart and with patient.    Past Medical History:  Diagnosis Date  . Acid reflux disease   . Gastritis   . GERD (gastroesophageal reflux disease)   . IBS (irritable bowel syndrome)   . Pancreatitis      Past Surgical History:  Procedure Laterality Date  . NO PAST SURGERIES     Family History  Problem Relation Age of Onset  . Breast cancer Mother   . Irritable bowel syndrome Maternal Grandmother   . Crohn's disease Paternal Uncle   . Colon cancer Neg Hx   . Esophageal cancer Neg Hx    Social History   Tobacco Use  . Smoking status: Current Some Day Smoker    Packs/day: 0.10    Years: 2.00    Pack years: 0.20    Types: Cigarettes  . Smokeless tobacco: Never Used  . Tobacco comment: Not cuuently  Substance Use Topics  . Alcohol use: Not Currently    Comment: 08/04/2018 was the last day she dranked   . Drug use: Yes    Types: Marijuana   Current Outpatient Medications  Medication Sig Dispense Refill  . acetaminophen (TYLENOL) 500 MG tablet Take 500 mg by mouth every 6 (six) hours as needed for mild pain.    Marland Kitchen dicyclomine (BENTYL) 10 MG capsule Take 1 capsule (10 mg total) by mouth every 6 (six) hours as needed for spasms. 60 capsule 3  . eszopiclone (LUNESTA) 2 MG TABS tablet Take 2 mg by mouth at bedtime.    . ondansetron (ZOFRAN-ODT) 4 MG disintegrating tablet Take 1 tablet (4 mg total) by mouth every 6 (six) hours as needed for nausea. 20 tablet 0   No current facility-administered medications for this visit.    Allergies  Allergen Reactions  . Zostrix [Capsaicin]     Burned patient skin     Review of Systems: All systems reviewed and negative except where noted in HPI.     Physical Exam:    Complete physical exam not completed due to the nature of this telehealth communication.   Gen: Awake, alert, and oriented, and well communicative. HEENT: EOMI, non-icteric sclera, NCAT, MMM Neck: Normal movement of head and  neck Pulm: No labored breathing, speaking in full sentences without conversational dyspnea Derm: No apparent lesions or bruising in visible field MS: Moves all visible extremities without noticeable abnormality Psych: Pleasant, cooperative, normal speech, thought processing seemingly intact   ASSESSMENT AND PLAN;   1) Pneumoperitoneum 2) Pneumomediastinum 3) Abdominal cramping 4) IBS-C 5) Nausea- Resolved  - Plan for EGD/colonoscopy to evaluate for luminal/mucosal etiology for recent retroperitoneal free air and subsequent admission for pneumomediastinum. We again discussed possible GI etiologies, to include IBD, Boerhaave's, PUD, etc. -Resume Protonix as prescribed for empiric treatment of possible gastritis/PUD - Tolerating p.o. intake and all activity without issue - Resume dietary modifications for history of IBS - Resume Bentyl prn. Take sinfrequently  -RTC after EGD/Colo  The indications, risks, and benefits of EGD and colonoscopy were explained to the patient in detail. Risks include but are not limited to bleeding, perforation, adverse reaction to medications, and cardiopulmonary compromise. Sequelae include but are not limited to the possibility of surgery, hositalization, and mortality. The patient verbalized understanding and wished to proceed. All questions answered, referred to scheduler and bowel prep ordered. Further recommendations pending results of the exam.  Lesbia Ottaway V Snow Peoples, DO, FACG  12/02/2018, 1:47 PM   No ref. provider found

## 2018-12-03 ENCOUNTER — Ambulatory Visit (HOSPITAL_COMMUNITY)
Admission: EM | Admit: 2018-12-03 | Discharge: 2018-12-03 | Disposition: A | Discharge status: Home / Self Care | Payer: 59 | Attending: Emergency Medicine | Admitting: Emergency Medicine

## 2018-12-03 ENCOUNTER — Encounter (HOSPITAL_COMMUNITY): Payer: Self-pay

## 2018-12-03 ENCOUNTER — Telehealth: Payer: Self-pay

## 2018-12-03 ENCOUNTER — Other Ambulatory Visit: Payer: Self-pay

## 2018-12-03 DIAGNOSIS — K581 Irritable bowel syndrome with constipation: Secondary | ICD-10-CM

## 2018-12-03 DIAGNOSIS — R110 Nausea: Secondary | ICD-10-CM

## 2018-12-03 DIAGNOSIS — M79601 Pain in right arm: Secondary | ICD-10-CM | POA: Diagnosis not present

## 2018-12-03 DIAGNOSIS — R109 Unspecified abdominal pain: Secondary | ICD-10-CM

## 2018-12-03 MED ORDER — DICLOFENAC SODIUM 1 % TD GEL: 2.0000 g | Freq: Four times a day (QID) | TRANSDERMAL | 0 refills | Status: DC

## 2018-12-03 NOTE — ED Triage Notes (Signed)
Pt states she has right arm pain 4 days ago. Pt states she was carrying some grocery in from the car on Sunday. And Monday was when she noticed the pain.

## 2018-12-03 NOTE — ED Provider Notes (Signed)
HPI  SUBJECTIVE:  Samantha Noble is a right-handed 33 y.o. female who presents with right medial upper arm pain starting after carrying some heavy groceries.  States it starts along the bicep and goes to her elbow.  This started 5 days ago.  She does not recall any direct trauma to the area.  She describes the pain as sore, throbbing, present only with extending her arm.  She denies feeling a pop, change in exercise regimen, deformity, bruising, swelling, distal numbness or tingling, grip weakness.  She tried Tylenol, icy hot, rest without improvement in her symptoms.  Symptoms are worse with extending her arm.  States that flexion is not painful.  She has never had symptoms like this before.  She has a past medical history of IBS, intraperitoneal free air and pneumomediastinum.  No history of chronic kidney disease, peptic ulcer disease.  She avoids ibuprofen as she states it makes her nauseous.  LMP: 3 weeks ago.  Denies possibility being pregnant.  PMD: None    Past Medical History:  Diagnosis Date  . Acid reflux disease   . Gastritis   . GERD (gastroesophageal reflux disease)   . IBS (irritable bowel syndrome)   . Pancreatitis     Past Surgical History:  Procedure Laterality Date  . NO PAST SURGERIES      Family History  Problem Relation Age of Onset  . Breast cancer Mother   . Irritable bowel syndrome Maternal Grandmother   . Crohn's disease Paternal Uncle   . Colon cancer Neg Hx   . Esophageal cancer Neg Hx     Social History   Tobacco Use  . Smoking status: Current Some Day Smoker    Packs/day: 0.10    Years: 2.00    Pack years: 0.20    Types: Cigarettes  . Smokeless tobacco: Never Used  . Tobacco comment: Not cuuently  Substance Use Topics  . Alcohol use: Not Currently    Comment: 08/04/2018 was the last day she dranked   . Drug use: Yes    Types: Marijuana    No current facility-administered medications for this encounter.   Current Outpatient Medications:   .  acetaminophen (TYLENOL) 500 MG tablet, Take 500 mg by mouth every 6 (six) hours as needed for mild pain., Disp: , Rfl:  .  diclofenac sodium (VOLTAREN) 1 % GEL, Apply 2 g topically 4 (four) times daily., Disp: 100 g, Rfl: 0 .  dicyclomine (BENTYL) 10 MG capsule, Take 1 capsule (10 mg total) by mouth every 6 (six) hours as needed for spasms., Disp: 60 capsule, Rfl: 3 .  eszopiclone (LUNESTA) 2 MG TABS tablet, Take 2 mg by mouth at bedtime., Disp: , Rfl:  .  ondansetron (ZOFRAN-ODT) 4 MG disintegrating tablet, Take 1 tablet (4 mg total) by mouth every 6 (six) hours as needed for nausea., Disp: 20 tablet, Rfl: 0  Allergies  Allergen Reactions  . Zostrix [Capsaicin]     Burned patient skin     ROS  As noted in HPI.   Physical Exam  BP (!) 143/91 (BP Location: Right Arm)   Pulse 83   Temp 98.1 F (36.7 C) (Oral)   Resp 18   Wt 65.8 kg   LMP 11/12/2018   SpO2 100%   BMI 24.89 kg/m   Constitutional: Well developed, well nourished, no acute distress Eyes:  EOMI, conjunctiva normal bilaterally HENT: Normocephalic, atraumatic,mucus membranes moist Respiratory: Normal inspiratory effort Cardiovascular: Normal rate GI: nondistended skin: No rash, skin  intact Musculoskeletal: Right arm: No deformities.  No tenderness over the wrist, radius/ulna, entire elbow, humerus.  No tenderness over the deltoid.  Positive tenderness along the distal third of inner arm starting at the medial epicondyle distal triceps.  No bruising, swelling.  No appreciable erythema.  Negative Popeye sign.  No tenderness over the biceps.  No pain with bicep flexion.  No palpable cord.  No tenderness along the ulnar groove.  Pain aggravated with arm extension.  No pain with tricep extension.  RP 2+.  Sensation grossly intact distally.  Grip 5/5. Neurologic: Alert & oriented x 3, no focal neuro deficits Psychiatric: Speech and behavior appropriate   ED Course   Medications - No data to display  No orders of  the defined types were placed in this encounter.   No results found for this or any previous visit (from the past 24 hour(s)). No results found.  ED Clinical Impression  1. Musculoskeletal arm pain, right      ED Assessment/Plan  Suspect ligamentous versus muscular injury.  It does not appear to be vascular.  Will send her home with topical Voltaren, continue Tylenol 1 g 3-4 times a day as needed for pain, ice the area at the end of the day.  Rest for another week.  Follow-up with [facility] or with Drs. Paulla Fore or Tamala Julian for further evaluation if not better with these measures.  Deferred oral NSAIDs due to the history of intraperitoneal free air from an unknown source.  Discussed MDM, treatment plan, and plan for follow-up with patient. patient agrees with plan.   Meds ordered this encounter  Medications  . diclofenac sodium (VOLTAREN) 1 % GEL    Sig: Apply 2 g topically 4 (four) times daily.    Dispense:  100 g    Refill:  0    *This clinic note was created using [employer]. Therefore, there may be occasional mistakes despite careful proofreading.   ?   Melynda Ripple, MD 12/03/18 1944

## 2018-12-03 NOTE — Discharge Instructions (Signed)
topical Voltaren 3 or 4 times a day as needed for pain, continue Tylenol 1 g 3-4 times a day as needed for pain, ice the area at the end of the day.  Rest for another week.  Follow-up with [facility] or with Drs. Paulla Fore or Tamala Julian for further evaluation if not better with these measures.

## 2018-12-03 NOTE — Telephone Encounter (Signed)
-----   Message from Elmer Ramp sent at 12/03/2018 10:23 AM EDT ----- This patient is scheduled for 01/07/2019 at 10:30am for Endo colon. Patient needs instructions mailed to her home. Also an ambulatory referral needs to be placed Please.  Thank you, Lorriane Shire

## 2018-12-04 MED ORDER — NA SULFATE-K SULFATE-MG SULF 17.5-3.13-1.6 GM/177ML PO SOLN: 1.0000 | Freq: Once | ORAL | 0 refills | Status: AC

## 2018-12-24 ENCOUNTER — Encounter: Payer: Self-pay | Admitting: Gastroenterology

## 2019-01-07 ENCOUNTER — Encounter: Payer: 59 | Admitting: Gastroenterology

## 2019-01-14 ENCOUNTER — Other Ambulatory Visit: Payer: Self-pay

## 2019-01-14 DIAGNOSIS — Z1159 Encounter for screening for other viral diseases: Secondary | ICD-10-CM

## 2019-01-14 DIAGNOSIS — R109 Unspecified abdominal pain: Secondary | ICD-10-CM

## 2019-01-14 DIAGNOSIS — K581 Irritable bowel syndrome with constipation: Secondary | ICD-10-CM

## 2019-01-14 NOTE — Progress Notes (Signed)
Covid test

## 2019-01-19 LAB — SARS CORONAVIRUS 2 (TAT 6-24 HRS): SARS Coronavirus 2: NEGATIVE

## 2019-01-20 ENCOUNTER — Telehealth: Payer: Self-pay

## 2019-01-20 NOTE — Telephone Encounter (Signed)
Patient called back and answered "NO" to all screening questions. °

## 2019-01-20 NOTE — Telephone Encounter (Signed)
Covid-19 screening questions   Do you now or have you had a fever in the last 14 days?  Do you have any respiratory symptoms of shortness of breath or cough now or in the last 14 days?  Do you have any family members or close contacts with diagnosed or suspected Covid-19 in the past 14 days?  Have you been tested for Covid-19 and found to be positive?       

## 2019-01-21 ENCOUNTER — Encounter: Payer: Self-pay | Admitting: Gastroenterology

## 2019-01-21 ENCOUNTER — Ambulatory Visit (AMBULATORY_SURGERY_CENTER): Payer: 59 | Admitting: Gastroenterology

## 2019-01-21 ENCOUNTER — Other Ambulatory Visit: Payer: Self-pay

## 2019-01-21 VITALS — BP 120/78 | HR 74 | Temp 98.8°F | Resp 17 | Ht 64.0 in | Wt 145.0 lb

## 2019-01-21 DIAGNOSIS — K2970 Gastritis, unspecified, without bleeding: Secondary | ICD-10-CM

## 2019-01-21 DIAGNOSIS — K633 Ulcer of intestine: Secondary | ICD-10-CM

## 2019-01-21 DIAGNOSIS — K648 Other hemorrhoids: Secondary | ICD-10-CM | POA: Diagnosis not present

## 2019-01-21 DIAGNOSIS — K3189 Other diseases of stomach and duodenum: Secondary | ICD-10-CM | POA: Diagnosis not present

## 2019-01-21 DIAGNOSIS — R110 Nausea: Secondary | ICD-10-CM

## 2019-01-21 DIAGNOSIS — K581 Irritable bowel syndrome with constipation: Secondary | ICD-10-CM

## 2019-01-21 DIAGNOSIS — K640 First degree hemorrhoids: Secondary | ICD-10-CM

## 2019-01-21 MED ORDER — SODIUM CHLORIDE 0.9 % IV SOLN: 500.0000 mL | Freq: Once | INTRAVENOUS | Status: DC

## 2019-01-21 NOTE — Op Note (Signed)
[place] Patient Name: Samantha Noble Procedure Date: 01/21/2019 1:27 PM MRN: [id number] Endoscopist: Gerrit Heck , MD Age: 33 Referring MD:  Date of Birth: January 09, 1986 Gender: Female Account #: [id number] Procedure:                Colonoscopy Indications:              Abdominal pain in the left lower quadrant,                            Abdominal pain in the left upper quadrant, Abnormal                            CT of the GI tract                           33 yo female admitted 5/5/9 with retroperitoneal                            free air on CT. Extended evaluation unrevealing for                            etiology (UGI series, serial CTs, serial exams).                            Treated with IVF and IV ABX. GI was consulted, and                            decision made to hold off on endoscopic procedures                            given clinical stability and concern to exacerbate                            free air. Similarly, ex lap not performed, and she                            continued to improve clinically and tolerating p.o.                            well at time of discharge. She was readmitted                            5/10-12 with left-sided abdominal pain, N/V and                            admission CT n/f pneumomediastinum, with decreased                            right retroperitoneal/abdominal subcutaneous gas,                            with concern for rupture of central airway. CT  surgery consulted and treated conservatively with                            IV ABX. Labs largely unrevealing, to include normal                            CBC, CMP, amylase, lipase, PTT, CRP. Presents today                            for EGD and colonoscopy to evaluate for etiology. Medicines:                Monitored Anesthesia Care Procedure:                Pre-Anesthesia Assessment:                           - Prior to the  procedure, a History and Physical                            was performed, and patient medications and                            allergies were reviewed. The patient's tolerance of                            previous anesthesia was also reviewed. The risks                            and benefits of the procedure and the sedation                            options and risks were discussed with the patient.                            All questions were answered, and informed consent                            was obtained. Prior Anticoagulants: The patient has                            taken no previous anticoagulant or antiplatelet                            agents. ASA Grade Assessment: II - A patient with                            mild systemic disease. After reviewing the risks                            and benefits, the patient was deemed in                            satisfactory condition to undergo the procedure.  After obtaining informed consent, the colonoscope                            was passed under direct vision. Throughout the                            procedure, the patient's blood pressure, pulse, and                            oxygen saturations were monitored continuously. The                            Colonoscope was introduced through the anus and                            advanced to the 10 cm into the ileum. The                            colonoscopy was performed without difficulty. The                            patient tolerated the procedure well. The quality                            of the bowel preparation was excellent. The                            terminal ileum, ileocecal valve, appendiceal                            orifice, and rectum were photographed. Scope In: 1:41:36 PM Scope Out: 1:51:11 PM Scope Withdrawal Time: 0 hours 7 minutes 16 seconds  Total Procedure Duration: 0 hours 9 minutes 35 seconds  Findings:                  The perianal and digital rectal examinations were                            normal.                           The colon (entire examined portion) appeared normal.                           Non-bleeding internal hemorrhoids were found during                            retroflexion. The hemorrhoids were small.                           The terminal ileum contained a single non-bleeding                            aphthous ulcer. There was minimal surrounding  erythema. This was biopsied with a cold forceps for                            histology. Estimated blood loss was minimal. The                            remainder of the examined ileum to 10 cm from the                            ICV was normal appearing. No additional ulceration                            and no erythema or edema noted. Complications:            No immediate complications. Estimated Blood Loss:     Estimated blood loss was minimal. Impression:               - The entire examined colon is normal.                           - Non-bleeding internal hemorrhoids.                           - Single, small aphthous ulcer in the terminal                            ileum. Biopsied. Recommendation:           - Patient has a contact number available for                            emergencies. The signs and symptoms of potential                            delayed complications were discussed with the                            patient. Return to normal activities tomorrow.                            Written discharge instructions were provided to the                            patient.                           - Resume previous diet.                           - Continue present medications.                           - Await pathology results.                           - Repeat colonoscopy at age 89 for screening  purposes.                           - Use fiber, for  example Citrucel, Fibercon, Konsyl                            or Metamucil.                           - Return to GI clinic in 3 months. Gerrit Heck, MD 01/21/2019 2:04:47 PM

## 2019-01-21 NOTE — Progress Notes (Signed)
Called to room to assist during endoscopic procedure.  Patient ID and intended procedure confirmed with present staff. Received instructions for my participation in the procedure from the performing physician.  

## 2019-01-21 NOTE — Patient Instructions (Signed)
YOU HAD AN ENDOSCOPIC PROCEDURE TODAY AT THE [facility]:   Refer to the procedure report that was given to you for any specific questions about what was found during the examination.  If the procedure report does not answer your questions, please call your gastroenterologist to clarify.  If you requested that your care partner not be given the details of your procedure findings, then the procedure report has been included in a sealed envelope for you to review at your convenience later.  YOU SHOULD EXPECT: Some feelings of bloating in the abdomen. Passage of more gas than usual.  Walking can help get rid of the air that was put into your GI tract during the procedure and reduce the bloating. If you had a lower endoscopy (such as a colonoscopy or flexible sigmoidoscopy) you may notice spotting of blood in your stool or on the toilet paper. If you underwent a bowel prep for your procedure, you may not have a normal bowel movement for a few days.  Please Note:  You might notice some irritation and congestion in your nose or some drainage.  This is from the oxygen used during your procedure.  There is no need for concern and it should clear up in a day or so.  SYMPTOMS TO REPORT IMMEDIATELY:   Following lower endoscopy (colonoscopy or flexible sigmoidoscopy):  Excessive amounts of blood in the stool  Significant tenderness or worsening of abdominal pains  Swelling of the abdomen that is new, acute  Fever of 100F or higher   Following upper endoscopy (EGD)  Vomiting of blood or coffee ground material  New chest pain or pain under the shoulder blades  Painful or persistently difficult swallowing  New shortness of breath  Fever of 100F or higher  Black, tarry-looking stools  For urgent or emergent issues, a gastroenterologist can be reached at any hour by calling [phone].   DIET:  We do recommend a small meal at first, but then you may proceed to your regular diet.  Drink  plenty of fluids but you should avoid alcoholic beverages for 24 hours.  ACTIVITY:  You should plan to take it easy for the rest of today and you should NOT DRIVE or use heavy machinery until tomorrow (because of the sedation medicines used during the test).    FOLLOW UP: Our staff will call the number listed on your records 48-72 hours following your procedure to check on you and address any questions or concerns that you may have regarding the information given to you following your procedure. If we do not reach you, we will leave a message.  We will attempt to reach you two times.  During this call, we will ask if you have developed any symptoms of COVID 19. If you develop any symptoms (ie: fever, flu-like symptoms, shortness of breath, cough etc.) before then, please call [phone].  If you test positive for Covid 19 in the 2 weeks post procedure, please call and report this information to us.    If any biopsies were taken you will be contacted by phone or by letter within the next 1-3 weeks.  Please call us at [phone] if you have not heard about the biopsies in 3 weeks.    SIGNATURES/CONFIDENTIALITY: You and/or your care partner have signed paperwork which will be entered into your electronic medical record.  These signatures attest to the fact that that the information above on your After Visit Summary has been reviewed and is   understood.  Full responsibility of the confidentiality of this discharge information lies with you and/or your care-partner. 

## 2019-01-21 NOTE — Progress Notes (Signed)
[place]

## 2019-01-21 NOTE — Op Note (Signed)
[place] Patient Name: Samantha Noble Procedure Date: 01/21/2019 1:28 PM MRN: [id number] Endoscopist: Gerrit Heck , MD Age: 33 Referring MD:  Date of Birth: 1985/07/15 Gender: Female Account #: [id number] Procedure:                Upper GI endoscopy Indications:              Abdominal pain in the left upper quadrant, ,                            Abdominal pain in the left lower quadrant, Abnormal                            CT of the GI tract                           33 yo female admitted 5/5/9 with retroperitoneal                            free air on CT. Extended evaluation unrevealing for                            etiology (UGI series, serial CTs, serial exams).                            Treated with IVF and IV ABX. GI was consulted, and                            decision made to hold off on endoscopic procedures                            given clinical stability and concern to exacerbate                            free air. Similarly, ex lap not performed, and she                            continued to improve clinically and tolerating p.o.                            well at time of discharge. She was readmitted                            5/10?"12 with left-sided abdominal pain, N/V and                            admission CTn/fpneumomediastinum, with decreased                            right retroperitoneal/abdominal subcutaneous gas,                            with concern for rupture of central airway. CT  surgery consulted and treated conservatively with                            IV ABX. Labs largely unrevealing, to include                            normal CBC, CMP, amylase, lipase, PTT, CRP.                            Presents today for EGD and colonoscopy to evaluate                            for etiology. Medicines:                Monitored Anesthesia Care Procedure:                Pre-Anesthesia  Assessment:                           - Prior to the procedure, a History and Physical                            was performed, and patient medications and                            allergies were reviewed. The patient's tolerance of                            previous anesthesia was also reviewed. The risks                            and benefits of the procedure and the sedation                            options and risks were discussed with the patient.                            All questions were answered, and informed consent                            was obtained. Prior Anticoagulants: The patient has                            taken no previous anticoagulant or antiplatelet                            agents. ASA Grade Assessment: II - A patient with                            mild systemic disease. After reviewing the risks                            and benefits, the patient was deemed in  satisfactory condition to undergo the procedure.                           After obtaining informed consent, the endoscope was                            passed under direct vision. Throughout the                            procedure, the patient's blood pressure, pulse, and                            oxygen saturations were monitored continuously. The                            Endoscope was introduced through the mouth, and                            advanced to the second part of duodenum. The upper                            GI endoscopy was accomplished without difficulty.                            The patient tolerated the procedure well. Scope In: Scope Out: Findings:                 The examined esophagus was normal.                           A mild luminal deformity was found in the                            prepyloric region of the stomach. Endoscopic                            appearance seemed most consistent with                             previously-healed ulcer. Several mucosal and                            tunneled biopsies were taken with a cold forceps                            for histology. Estimated blood loss was minimal.                           Scattered minimal inflammation characterized by                            erythema was found at the incisura and in the                            gastric antrum. Biopsies were  taken with a cold                            forceps for Helicobacter pylori testing. Estimated                            blood loss was minimal.                           The cardia, gastric fundus and gastric body were                            normal. Biopsies were taken with a cold forceps for                            Helicobacter pylori testing. Estimated blood loss                            was minimal.                           The duodenal bulb, first portion of the duodenum                            and second portion of the duodenum were normal.                            Biopsies were taken with a cold forceps for                            histology. Estimated blood loss was minimal. Complications:            No immediate complications. Estimated Blood Loss:     Estimated blood loss was minimal. Impression:               - Normal esophagus.                           - Very mild luminal deformity in the prepyloric                            region of the stomach. Biopsied.                           - Gastritis. Biopsied.                           - Normal cardia, gastric fundus and gastric body.                            Biopsied.                           - Normal duodenal bulb, first portion of the  duodenum and second portion of the duodenum.                            Biopsied. Recommendation:           - Patient has a contact number available for                            emergencies. The signs and symptoms of potential                             delayed complications were discussed with the                            patient. Return to normal activities tomorrow.                            Written discharge instructions were provided to the                            patient.                           - Resume previous diet.                           - Continue present medications.                           - Await pathology results.                           - Perform a colonoscopy today. Gerrit Heck, MD 01/21/2019 2:00:01 PM

## 2019-01-21 NOTE — Progress Notes (Signed)
Report given to PACU, vss 

## 2019-01-22 ENCOUNTER — Telehealth: Payer: Self-pay

## 2019-01-22 DIAGNOSIS — K581 Irritable bowel syndrome with constipation: Secondary | ICD-10-CM

## 2019-01-22 DIAGNOSIS — R110 Nausea: Secondary | ICD-10-CM

## 2019-01-22 DIAGNOSIS — R109 Unspecified abdominal pain: Secondary | ICD-10-CM

## 2019-01-22 MED ORDER — DICYCLOMINE HCL 10 MG PO CAPS: 10.0000 mg | ORAL_CAPSULE | Freq: Four times a day (QID) | ORAL | 3 refills | Status: DC | PRN

## 2019-01-22 MED ORDER — ONDANSETRON 4 MG PO TBDP: 4.0000 mg | ORAL_TABLET | Freq: Four times a day (QID) | ORAL | 3 refills | Status: DC | PRN

## 2019-01-22 NOTE — Telephone Encounter (Signed)
Called and spoke with patient-patient informed of MD recommendations; patient is agreeable with plan of care and RX's have been sent to pharmacy of patient choice; Patient verbalized understanding of information/instructions;  Patient was advised to call the office at [phone] if questions/concerns arise;

## 2019-01-22 NOTE — Telephone Encounter (Signed)
-----   Message from [place], DO sent at 01/21/2019  2:49 PM EDT ----- Can you please send in the following Rx refills:  - Bentyl 10 mg prn every 6 hours as needed for pain. #60, Rf3 - Zofran 4 mg prn every 6 hours as needed for nausea. #30, Rf3  Thanks

## 2019-01-23 ENCOUNTER — Telehealth: Payer: Self-pay

## 2019-01-23 NOTE — Telephone Encounter (Signed)
Attempted to reach pt. With follow-up call following endoscopic procedure 01/21/2019.  LM on pt. Voice mail.  Will try to reach pt. Again later today.

## 2019-01-23 NOTE — Telephone Encounter (Signed)
  Follow up Call-  Call back number 01/21/2019  Post procedure Call Back phone  # [phone] cell  Permission to leave phone message Yes  Some recent data might be hidden     Patient questions:  Do you have a fever, pain , or abdominal swelling? No. Pain Score  0 *  Have you tolerated food without any problems? Yes.    Have you been able to return to your normal activities? Yes.    Do you have any questions about your discharge instructions: Diet   No. Medications  No. Follow up visit  No.  Do you have questions or concerns about your Care? No.  Actions: * If pain score is 4 or above: No action needed, pain <4. 1. Have you developed a fever since your procedure? no  2.   Have you had an respiratory symptoms (SOB or cough) since your procedure? no  3.   Have you tested positive for COVID 19 since your procedure no  4.   Have you had any family members/close contacts diagnosed with the COVID 19 since your procedure?  no   If yes to any of these questions please route to Joylene John, RN and Alphonsa Gin, Therapist, sports.

## 2019-01-27 ENCOUNTER — Encounter: Payer: Self-pay | Admitting: Gastroenterology

## 2019-07-29 ENCOUNTER — Encounter (HOSPITAL_COMMUNITY): Payer: Self-pay

## 2019-07-29 ENCOUNTER — Other Ambulatory Visit: Payer: Self-pay

## 2019-07-29 ENCOUNTER — Ambulatory Visit (HOSPITAL_COMMUNITY)
Admission: EM | Admit: 2019-07-29 | Discharge: 2019-07-29 | Disposition: A | Discharge status: Home / Self Care | Payer: 59 | Attending: Internal Medicine | Admitting: Internal Medicine

## 2019-07-29 DIAGNOSIS — H10029 Other mucopurulent conjunctivitis, unspecified eye: Secondary | ICD-10-CM | POA: Diagnosis not present

## 2019-07-29 MED ORDER — ERYTHROMYCIN 5 MG/GM OP OINT: TOPICAL_OINTMENT | OPHTHALMIC | 0 refills | Status: AC

## 2019-07-29 NOTE — ED Triage Notes (Signed)
Pt c/o pain and drainage to right eye. Pt reports she woke up this morning with dried exudate to right eye with blurry vision and HA. Denies sore throat, congestion, fever, chills, runny nose, abdom pain, n/v/d, or neuro sx.

## 2019-07-30 NOTE — ED Provider Notes (Signed)
[name]    CSN: [id number] Arrival date & time: 07/29/19  0957      History   Chief Complaint Chief Complaint  Patient presents with  . Eye Problem    HPI Samantha Noble is a 34 y.o. female comes to urgent care with complaints of right lower eyelid swelling and pain on the medial aspect of the right eye of 1 day duration.  Patient woke up this morning and noticed dry yellowish exudate on the right eye.  It was associated with blurry vision in the right eye.   Patient denies any photophobia.  No fever or chills.  No trauma to the eye.  HPI  Past Medical History:  Diagnosis Date  . Acid reflux disease   . Depression   . Gastritis   . GERD (gastroesophageal reflux disease)   . IBS (irritable bowel syndrome)   . Pancreatitis     Patient Active Problem List   Diagnosis Date Noted  . Pneumomediastinum ([place]) 08/17/2018  . Retroperitoneal air   . Abdominal pain   . Intra-abdominal free air of unknown etiology 08/13/2018    Past Surgical History:  Procedure Laterality Date  . NO PAST SURGERIES    . WISDOM TOOTH EXTRACTION      OB History    Gravida  0   Para      Term      Preterm      AB      Living        SAB      TAB      Ectopic      Multiple      Live Births               Home Medications    Prior to Admission medications   Medication Sig Start Date End Date Taking? Authorizing Provider  dicyclomine (BENTYL) 10 MG capsule Take 1 capsule (10 mg total) by mouth every 6 (six) hours as needed for spasms. 01/22/19  Yes Cirigliano, Vito V, DO  ondansetron (ZOFRAN-ODT) 4 MG disintegrating tablet Take 1 tablet (4 mg total) by mouth every 6 (six) hours as needed for nausea. 01/22/19  Yes Cirigliano, Vito V, DO  acetaminophen (TYLENOL) 500 MG tablet Take 500 mg by mouth every 6 (six) hours as needed for mild pain.    [provider]  erythromycin ophthalmic ointment Place a 1/2 inch ribbon of ointment into the lower  eyelid. 07/29/19 08/03/19  Chase Picket, MD    Family History Family History  Problem Relation Age of Onset  . Breast cancer Mother   . Irritable bowel syndrome Maternal Grandmother   . Crohn's disease Paternal Uncle   . Colon cancer Neg Hx   . Esophageal cancer Neg Hx   . Rectal cancer Neg Hx   . Stomach cancer Neg Hx     Social History Social History   Tobacco Use  . Smoking status: Former Smoker    Packs/day: 0.10    Years: 2.00    Pack years: 0.20    Types: Cigarettes    Quit date: 07/2018    Years since quitting: 1.0  . Smokeless tobacco: Never Used  . Tobacco comment: Not cuuently  Substance Use Topics  . Alcohol use: Yes    Comment: 01/18/2019 was the last day she dranked   . Drug use: Yes    Types: Marijuana    Comment: last smoked marijuana 01-20-2019     Allergies  Zostrix [capsaicin]   Review of Systems Review of Systems  HENT: Negative.   Eyes: Positive for discharge, redness and visual disturbance. Negative for photophobia and itching.  Respiratory: Negative.   Cardiovascular: Negative.   Gastrointestinal: Negative.   Genitourinary: Negative.      Physical Exam Triage Vital Signs ED Triage Vitals  Enc Vitals Group     BP 07/29/19 1030 (!) 135/96     Pulse Rate 07/29/19 1030 77     Resp 07/29/19 1030 18     Temp 07/29/19 1030 97.9 F (36.6 C)     Temp Source 07/29/19 1030 Oral     SpO2 07/29/19 1030 100 %     Weight --      Height --      Head Circumference --      Peak Flow --      Pain Score 07/29/19 1027 6     Pain Loc --      Pain Edu? --      Excl. in [place]? --    No data found.  Updated Vital Signs BP (!) 135/96 (BP Location: Right Arm)   Pulse 77   Temp 97.9 F (36.6 C) (Oral)   Resp 18   LMP 07/09/2019   SpO2 100%   Visual Acuity Right Eye Distance:   Left Eye Distance:   Bilateral Distance:    Right Eye Near:   Left Eye Near:    Bilateral Near:     Physical Exam Constitutional:      Appearance: Normal  appearance.  HENT:     Right Ear: Tympanic membrane normal.     Left Ear: Tympanic membrane normal.  Eyes:     General:        Right eye: No discharge.        Left eye: Discharge present.    Extraocular Movements: Extraocular movements intact.     Conjunctiva/sclera: Conjunctivae normal.     Pupils: Pupils are equal, round, and reactive to light.     Comments: Visual acuity Right eye 20/200 Left eye 20/40  Cardiovascular:     Rate and Rhythm: Normal rate and regular rhythm.  Neurological:     Mental Status: She is alert.      UC Treatments / Results  Labs (all labs ordered are listed, but only abnormal results are displayed) Labs Reviewed - No data to display  EKG   Radiology No results found.  Procedures Procedures (including critical care time)  Medications Ordered in UC Medications - No data to display  Initial Impression / Assessment and Plan / UC Course  I have reviewed the triage vital signs and the nursing notes.  Pertinent labs & imaging results that were available during my care of the patient were reviewed by me and considered in my medical decision making (see chart for details).     1.  Acute bacterial conjunctivitis: Erythromycin eye ointment to be used for 5 days Patient will need optometrist ophthalmology follow-up to be evaluated for reading glasses.  Patient was educated on that.  She knows that she needs reading glasses and agrees to make an appointment to see an ophthalmologist or an optometrist. Final Clinical Impressions(s) / UC Diagnoses   Final diagnoses:  Other mucopurulent conjunctivitis, unspecified laterality   Discharge Instructions   None    ED Prescriptions    Medication Sig Dispense Auth. Provider   erythromycin ophthalmic ointment Place a 1/2 inch ribbon of ointment into the lower eyelid. 3.5 g  Vada Swift, Myrene Galas, MD     PDMP not reviewed this encounter.   Chase Picket, MD 07/30/19 [phone]

## 2019-08-07 ENCOUNTER — Other Ambulatory Visit: Payer: Self-pay

## 2019-08-07 ENCOUNTER — Telehealth: Payer: Self-pay | Admitting: Gastroenterology

## 2019-08-07 DIAGNOSIS — R110 Nausea: Secondary | ICD-10-CM

## 2019-08-07 DIAGNOSIS — K581 Irritable bowel syndrome with constipation: Secondary | ICD-10-CM

## 2019-08-07 DIAGNOSIS — R109 Unspecified abdominal pain: Secondary | ICD-10-CM

## 2019-08-07 MED ORDER — ONDANSETRON 4 MG PO TBDP: 4.0000 mg | ORAL_TABLET | Freq: Four times a day (QID) | ORAL | 3 refills | Status: DC | PRN

## 2019-08-07 MED ORDER — DICYCLOMINE HCL 10 MG PO CAPS: 10.0000 mg | ORAL_CAPSULE | Freq: Four times a day (QID) | ORAL | 3 refills | Status: DC | PRN

## 2019-08-07 NOTE — Telephone Encounter (Signed)
Patient is requesting refill for dicyclomine and Zofran. States that the zofran- she does not want to take tablets- seeing if she can take something that helps with immediate nausea- the zofran does not help take away the sick to her stomach feeling.

## 2019-08-07 NOTE — Telephone Encounter (Signed)
Medication has been sent to the pharmacy. 

## 2019-08-07 NOTE — Telephone Encounter (Signed)
Lmom for patient to call back 

## 2019-08-12 ENCOUNTER — Ambulatory Visit: Payer: 59 | Admitting: Gastroenterology

## 2019-09-26 ENCOUNTER — Other Ambulatory Visit: Payer: Self-pay

## 2019-09-26 ENCOUNTER — Encounter (HOSPITAL_BASED_OUTPATIENT_CLINIC_OR_DEPARTMENT_OTHER): Payer: Self-pay

## 2019-09-26 ENCOUNTER — Emergency Department (HOSPITAL_BASED_OUTPATIENT_CLINIC_OR_DEPARTMENT_OTHER)
Admission: EM | Admit: 2019-09-26 | Discharge: 2019-09-26 | Disposition: A | Discharge status: Home / Self Care | Payer: 59 | Attending: Emergency Medicine | Admitting: Emergency Medicine

## 2019-09-26 DIAGNOSIS — Z886 Allergy status to analgesic agent status: Secondary | ICD-10-CM | POA: Diagnosis not present

## 2019-09-26 DIAGNOSIS — H538 Other visual disturbances: Secondary | ICD-10-CM | POA: Diagnosis present

## 2019-09-26 DIAGNOSIS — Z87891 Personal history of nicotine dependence: Secondary | ICD-10-CM | POA: Diagnosis not present

## 2019-09-26 LAB — CBG MONITORING, ED: Glucose-Capillary: 92 mg/dL (ref 70–99)

## 2019-09-26 MED ORDER — FLUORESCEIN SODIUM 1 MG OP STRP
1.0000 | ORAL_STRIP | Freq: Once | OPHTHALMIC | Status: AC
  Administered 2019-09-26: 1 via OPHTHALMIC
  Filled 2019-09-26: qty 1

## 2019-09-26 MED ORDER — TETRACAINE HCL 0.5 % OP SOLN
2.0000 [drp] | Freq: Once | OPHTHALMIC | Status: AC
  Administered 2019-09-26: 2 [drp] via OPHTHALMIC
  Filled 2019-09-26: qty 4

## 2019-09-26 NOTE — ED Triage Notes (Signed)
Pt c/o blurred vision that started last PM. Pt then stated she took 6 x 25mg  of diphenhydramine sleep aid last PM to help her sleep. Denies any SI or HI.

## 2019-09-26 NOTE — Discharge Instructions (Addendum)
You were seen in the emergency department for blurry vision in both your eyes.  Your eye exam was fairly normal other than your vision was decreased.  Your eye pressure was normal.  This may be related to diphenhydramine that you took.  Please follow-up with ophthalmology and return to the emergency department if any worsening or concerning symptoms.  Please also hold your dicyclomine for now.

## 2019-09-26 NOTE — ED Notes (Signed)
Pt discharged to home. Discharge instructions have been discussed with patient and/or family members. Pt verbally acknowledges understanding d/c instructions, and endorses comprehension to checkout at registration before leaving.  °

## 2019-09-26 NOTE — ED Notes (Signed)
ED Provider at bedside. 

## 2019-09-26 NOTE — ED Provider Notes (Signed)
[facility] EMERGENCY DEPARTMENT Provider Note   CSN: [id number] Arrival date & time: 09/26/19  1547     History Chief Complaint  Patient presents with  . Drug Overdose  cc: blurry vision  Samantha Noble is a 34 y.o. female.  She is here with a complaint of blurry vision that started last evening.  She said she had taken diphenhydramine 6 tablets 25 mg to help her sleep.  She was having trouble sleeping because her IBS was acting up.  She started noticing her vision was blurring and when she woke up this morning it was even worse.  There is no eye pain.  She said she knows her eyes are bad anyways that she supposed to wear glasses but does not.  No double vision.  No headache numbness weakness.  No nausea vomiting.  Denies any eye trauma.  The history is provided by the patient.  Eye Problem Location:  Both eyes Quality: blurry. Severity:  Moderate Onset quality:  Gradual Duration:  24 hours Timing:  Constant Progression:  Unchanged Chronicity:  New Context comment:  ? medication related Relieved by:  Nothing Worsened by:  Nothing Ineffective treatments:  None tried Associated symptoms: blurred vision   Associated symptoms: no crusting, no discharge, no double vision, no facial rash, no headaches, no itching, no nausea, no tearing and no vomiting   Risk factors: no previous injury to eye        Past Medical History:  Diagnosis Date  . Acid reflux disease   . Depression   . Gastritis   . GERD (gastroesophageal reflux disease)   . IBS (irritable bowel syndrome)   . Pancreatitis     Patient Active Problem List   Diagnosis Date Noted  . Pneumomediastinum ([place]) 08/17/2018  . Retroperitoneal air   . Abdominal pain   . Intra-abdominal free air of unknown etiology 08/13/2018    Past Surgical History:  Procedure Laterality Date  . NO PAST SURGERIES    . WISDOM TOOTH EXTRACTION       OB History    Gravida  0   Para      Term      Preterm      AB       Living        SAB      TAB      Ectopic      Multiple      Live Births              Family History  Problem Relation Age of Onset  . Breast cancer Mother   . Irritable bowel syndrome Maternal Grandmother   . Crohn's disease Paternal Uncle   . Colon cancer Neg Hx   . Esophageal cancer Neg Hx   . Rectal cancer Neg Hx   . Stomach cancer Neg Hx     Social History   Tobacco Use  . Smoking status: Former Smoker    Packs/day: 0.10    Years: 2.00    Pack years: 0.20    Types: Cigarettes    Quit date: 07/2018    Years since quitting: 1.2  . Smokeless tobacco: Never Used  . Tobacco comment: Not cuuently  Vaping Use  . Vaping Use: Former  Substance Use Topics  . Alcohol use: Yes    Comment: 01/18/2019 was the last day she dranked   . Drug use: Yes    Types: Marijuana    Comment: last smoked marijuana 01-20-2019  Home Medications Prior to Admission medications   Medication Sig Start Date End Date Taking? Authorizing Provider  acetaminophen (TYLENOL) 500 MG tablet Take 500 mg by mouth every 6 (six) hours as needed for mild pain.    [provider]  dicyclomine (BENTYL) 10 MG capsule Take 1 capsule (10 mg total) by mouth every 6 (six) hours as needed for spasms. 08/07/19   Cirigliano, Vito V, DO  ondansetron (ZOFRAN-ODT) 4 MG disintegrating tablet Take 1 tablet (4 mg total) by mouth every 6 (six) hours as needed for nausea. 08/07/19   Cirigliano, Vito V, DO    Allergies    Zostrix [capsaicin]  Review of Systems   Review of Systems  Constitutional: Negative for fever.  HENT: Negative for sore throat.   Eyes: Positive for blurred vision and visual disturbance. Negative for double vision, pain, discharge and itching.  Respiratory: Negative for shortness of breath.   Cardiovascular: Negative for chest pain.  Gastrointestinal: Negative for abdominal pain, nausea and vomiting.  Genitourinary: Negative for dysuria.  Musculoskeletal: Negative for neck  pain.  Skin: Negative for rash.  Neurological: Negative for headaches.    Physical Exam Updated Vital Signs BP 120/76 (BP Location: Right Arm)   Pulse 74   Temp 98.3 F (36.8 C) (Oral)   Resp 16   Ht 5\' 4"  (1.626 m)   Wt 70.3 kg   LMP 09/14/2019   SpO2 100%   BMI 26.61 kg/m   Physical Exam Vitals and nursing note reviewed.  Constitutional:      Appearance: Normal appearance. She is well-developed.  HENT:     Head: Normocephalic and atraumatic.     Mouth/Throat:     Mouth: Mucous membranes are moist.     Pharynx: Oropharynx is clear.  Eyes:     General:        Right eye: No discharge.        Left eye: No discharge.     Extraocular Movements: Extraocular movements intact.     Conjunctiva/sclera: Conjunctivae normal.     Right eye: Right conjunctiva is not injected.     Left eye: Left conjunctiva is not injected.     Pupils: Pupils are equal, round, and reactive to light.     Right eye: No corneal abrasion or fluorescein uptake. Seidel exam negative.     Left eye: No corneal abrasion or fluorescein uptake. Seidel exam negative.    Funduscopic exam:    Right eye: No hemorrhage.        Left eye: No hemorrhage.     Slit lamp exam:    Right eye: Anterior chamber quiet.     Left eye: Anterior chamber quiet.  Cardiovascular:     Rate and Rhythm: Normal rate and regular rhythm.     Pulses: Normal pulses.  Pulmonary:     Effort: Pulmonary effort is normal.     Breath sounds: Normal breath sounds.  Abdominal:     Tenderness: There is no abdominal tenderness. There is no guarding or rebound.  Musculoskeletal:        General: No deformity or signs of injury. Normal range of motion.     Cervical back: Neck supple.  Skin:    General: Skin is warm and dry.     Capillary Refill: Capillary refill takes less than 2 seconds.  Neurological:     General: No focal deficit present.     Mental Status: She is alert and oriented to person, place, and time.  GCS: GCS eye subscore  is 4. GCS verbal subscore is 5. GCS motor subscore is 6.     Cranial Nerves: No cranial nerve deficit.     Sensory: No sensory deficit.     Motor: No weakness.     Gait: Gait normal.     ED Results / Procedures / Treatments   Labs (all labs ordered are listed, but only abnormal results are displayed) Labs Reviewed  CBG MONITORING, ED    EKG None  Radiology No results found.  Procedures Procedures (including critical care time)  Medications Ordered in ED Medications - No data to display  ED Course  I have reviewed the triage vital signs and the nursing notes.  Pertinent labs & imaging results that were available during my care of the patient were reviewed by me and considered in my medical decision making (see chart for details).  Clinical Course as of Sep 25 1836  Sat Sep 26, 2019  1837 Tetracaine to both eyes.  Intraocular pressure 17 on the right and 13 on the left.  Fluorescein with no uptake bilaterally.  Patient states she has an eye doctor that she can see.  Recommended that she follow-up with them.  Return instructions discussed.   [MB]    Clinical Course User Index [MB] Hayden Rasmussen, MD   MDM Rules/Calculators/A&P                         34 year old female complaining of 24 hours of blurred vision.  Differential includes acute angle glaucoma, corneal foreign body, retinal detachment, vitreous bleed, hyperglycemia.  Eye exam unremarkable other than visual acuity.  She did take a significant amount of anticholinergics within the last 24 hours including Bentyl and diphenhydramine.  Recommend urgent follow-up with ophthalmology.  Return instructions discussed.  Final Clinical Impression(s) / ED Diagnoses Final diagnoses:  Blurry vision, bilateral    Rx / DC Orders ED Discharge Orders    None       Hayden Rasmussen, MD 09/27/19 1049

## 2019-09-30 ENCOUNTER — Ambulatory Visit (INDEPENDENT_AMBULATORY_CARE_PROVIDER_SITE_OTHER): Payer: 59 | Admitting: Gastroenterology

## 2019-09-30 ENCOUNTER — Encounter: Payer: Self-pay | Admitting: Gastroenterology

## 2019-09-30 VITALS — BP 114/78 | HR 71 | Temp 97.5°F | Ht 64.0 in | Wt 152.1 lb

## 2019-09-30 DIAGNOSIS — R110 Nausea: Secondary | ICD-10-CM | POA: Diagnosis not present

## 2019-09-30 DIAGNOSIS — R140 Abdominal distension (gaseous): Secondary | ICD-10-CM | POA: Diagnosis not present

## 2019-09-30 DIAGNOSIS — R109 Unspecified abdominal pain: Secondary | ICD-10-CM

## 2019-09-30 DIAGNOSIS — K581 Irritable bowel syndrome with constipation: Secondary | ICD-10-CM

## 2019-09-30 NOTE — Progress Notes (Signed)
P  Chief Complaint:    Abdominal bloating  GI History: 34 y.o.femalewith a history of anxiety disorder, GERD, gastritis, IBS, history of pancreatitis, marijuana use, admitted 08/12/2018 with retroperitoneal free air on CT.  Extended evaluation unrevealing for etiology (UGI series, serial CTs, serial exams). Treated with IVF and IV ABX. GI was consulted, and decision made to hold off on endoscopic procedures given clinical stability and concern to exacerbate free air. Similarly, ex lap not performed, and she continued to improve clinically and tolerating p.o. well at time of discharge. Plan was for EGD/colonoscopy 8 weeks post discharge.  She was readmitted 5/10-12 (left AMA) with left-sided abdominal pain, N/V and admission CTn/fpneumomediastinum, with decreased right retroperitoneal/abdominal subcutaneous gas, with concern for rupture of central airway. CT surgery consulted and treated conservatively with IV ABX. GI consulted but she left AMA prior to evaluation. Labs largely unrevealing, to include normal CBC, CMP, amylase, lipase, PTT, CRP.  Evaluation to date: -CT (08/13/2018): Free air in right retroperitoneum, with source suspected to be retroperitoneal portion of a sending colon, but no clear point perforation identified. Mild ascending/transverse colitis -UGI series (08/14/2018): Normal stomach, mildly prominent distal duodenum. No extravasation -CT (08/15/2018): Diminished pneumomediastinum, retroperitoneal air and chest/abdominal wall air. Etiology felt to be chest related. No intra-abdominal pathology -CT C/A/P (08/17/2018): Moderate pneumomediastinum extending into the neck and decreased right retroperitoneal/right lateral abdominal subcutaneous gas. Likely represents rupture of a central airway with extension into the neck and abdomen. No pneumothorax -Barium esophagram (08/18/2018): Normal. No leak. No HH or reflux  Endoscopic History: -EGD (01/21/2019, Dr.  Bryan Lemma): Mild luminal deformity prepyloric antrum consistent with previously healed ulcer, mild gastritis, normal esophagus/duodenum -Colonoscopy (01/21/2019, Dr. Bryan Lemma): Single aphthous ulcer in TI (biopsies benign), internal hemorrhoids, otherwise normal colonoscopy.  Repeat age 34  Separately, she has a Hx of IBS-C. Cramping previously well controlled with intermittent use of Bentyl. Otherwise, does well with dietary mods and avoiding trigger type foods. Sxs worse with anxiety. Can also have nausea with abd cramping. Sxs present for at least 5 years.  No known family history of CRC, GI malignancy, liver disease, pancreatic disease, or IBD.  HPI:     Patient is a 34 y.o. female presenting to the [facility]enterology Clinic for follow-up.  Was last seen by me in the office on 12/02/2018, then underwent EGD/colonoscopy on 01/21/2019 as outlined above.  Today, she presents to the GI clinic for evaluation of abdominal bloating.  She states she has tried improving her diet (no soda, stopped pork), stopped all EtOH with some improvement, but still near-daily abdominal bloating and visible distension.  No hematochezia, melena, weight loss.  Constipation much improved. Will use Bentyl prn abdominal cramping with good relief.   Nausea occurs intermittently, with variable response to prn Zofran.  Still smokes marijuana regularly.  No new imaging or labs for review.  Review of systems:     No chest pain, no SOB, no fevers, no urinary sx   Past Medical History:  Diagnosis Date  . Acid reflux disease   . Depression   . Gastritis   . GERD (gastroesophageal reflux disease)   . IBS (irritable bowel syndrome)   . Pancreatitis     Patient's surgical history, family medical history, social history, medications and allergies were all reviewed in Epic    Current Outpatient Medications  Medication Sig Dispense Refill  . acetaminophen (TYLENOL) 500 MG tablet Take 500 mg by mouth every 6  (six) hours as needed for mild pain.    Marland Kitchen  dicyclomine (BENTYL) 10 MG capsule Take 1 capsule (10 mg total) by mouth every 6 (six) hours as needed for spasms. 60 capsule 3  . ondansetron (ZOFRAN-ODT) 4 MG disintegrating tablet Take 1 tablet (4 mg total) by mouth every 6 (six) hours as needed for nausea. 30 tablet 3   No current facility-administered medications for this visit.    Physical Exam:     BP 114/78   Pulse 71   Temp (!) 97.5 F (36.4 C)   Ht 5\' 4"  (1.626 m)   Wt 152 lb 2 oz (69 kg)   LMP 09/14/2019   BMI 26.11 kg/m   GENERAL:  Pleasant female in NAD PSYCH: : Cooperative, normal affect EENT:  conjunctiva pink, mucous membranes moist, neck supple without masses CARDIAC:  RRR, no murmur heard, no peripheral edema PULM: Normal respiratory effort, lungs CTA bilaterally, no wheezing ABDOMEN:  Nondistended, soft, nontender. No obvious masses, no hepatomegaly,  normal bowel sounds SKIN:  turgor, no lesions seen Musculoskeletal:  Normal muscle tone, normal strength NEURO: Alert and oriented x 3, no focal neurologic deficits   IMPRESSION and PLAN:    1) IBS-C 2) Abdominal bloating 3) Abdominal cramping 4) Nausea  -Start low FODMAP diet.  Provided with handout in detail instruction today -Resume Bentyl as needed -Diet diary -Stop Marijuana -If nausea symptoms persist, could consider repeat upper endoscopy given previous prepyloric antrum luminal deformity.  RTC in 6-12 months or sooner as needed  I spent 22 minutes of time, including independent review of results as outlined above, communicating results with the patient directly, face-to-face time with the patient, coordinating care, ordering studies and medications as appropriate, and documentation.             [place] ,DO, FACG 09/30/2019, 11:00 AM

## 2019-09-30 NOTE — Patient Instructions (Addendum)
Low FODMAP Diet: (Fermentable Oligosaccharides, Disaccharides, Monosaccharides, and Polyols) These are short chain carbohydrates and sugar alcohols that are poorly absorbed by the body, resulting in multiple abdominal symptoms, including changes in bowel habits, abdominal pain/discomfort, bloating, abdominal distension, gas, etc.    It was a pleasure to see you today!  Vito Cirigliano, D.O.

## 2019-10-24 ENCOUNTER — Other Ambulatory Visit: Payer: Self-pay | Admitting: Gastroenterology

## 2019-10-24 DIAGNOSIS — R110 Nausea: Secondary | ICD-10-CM

## 2019-10-24 DIAGNOSIS — K581 Irritable bowel syndrome with constipation: Secondary | ICD-10-CM

## 2019-10-24 DIAGNOSIS — R109 Unspecified abdominal pain: Secondary | ICD-10-CM

## 2019-11-25 ENCOUNTER — Other Ambulatory Visit: Payer: Self-pay | Admitting: Gastroenterology

## 2019-11-25 DIAGNOSIS — K581 Irritable bowel syndrome with constipation: Secondary | ICD-10-CM

## 2019-11-25 DIAGNOSIS — R109 Unspecified abdominal pain: Secondary | ICD-10-CM

## 2019-11-25 DIAGNOSIS — R110 Nausea: Secondary | ICD-10-CM

## 2019-12-22 ENCOUNTER — Other Ambulatory Visit: Payer: Self-pay | Admitting: Family Medicine

## 2019-12-22 DIAGNOSIS — R110 Nausea: Secondary | ICD-10-CM

## 2019-12-22 DIAGNOSIS — K581 Irritable bowel syndrome with constipation: Secondary | ICD-10-CM

## 2019-12-22 DIAGNOSIS — R109 Unspecified abdominal pain: Secondary | ICD-10-CM

## 2020-02-20 IMAGING — RF ESOPHAGUS/BARIUM SWALLOW/TABLET STUDY
5 series · 20 of 20 positions shown · non-contrast
Comparison: CT chest dated August 17, 2018.

CLINICAL DATA: Pneumomediastinum.  Evaluate for esophageal rupture.

EXAM:
ESOPHOGRAM/BARIUM SWALLOW
TECHNIQUE: Single contrast examination was performed using water-soluble
contrast.
FLUOROSCOPY TIME:  Fluoroscopy Time:  48 seconds
Radiation Exposure Index (if provided by the fluoroscopic device):
2.4 mGy
Number of Acquired Spot Images: 0

[Series 15: cp_standard · 0.51mm/px · 4 of 27 frames shown (1 of 5)]
[frame 5/27]
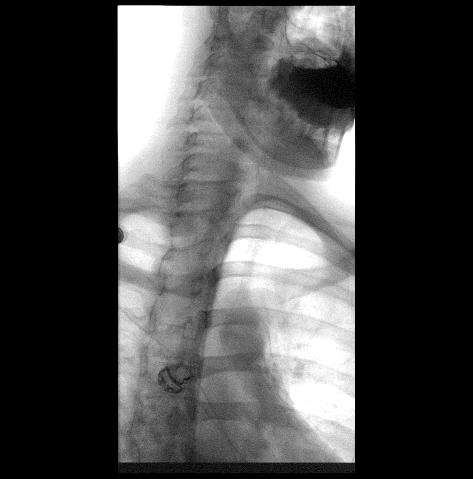
[frame 7/27]
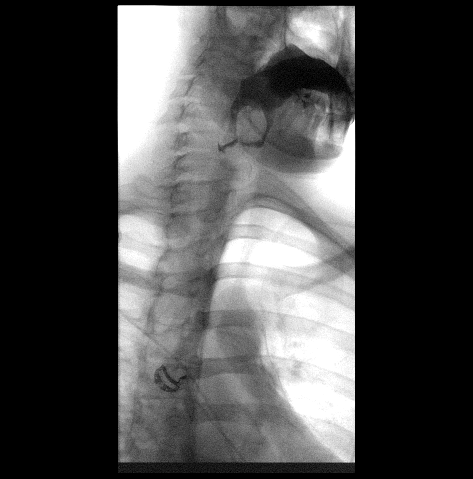
[frame 14/27]
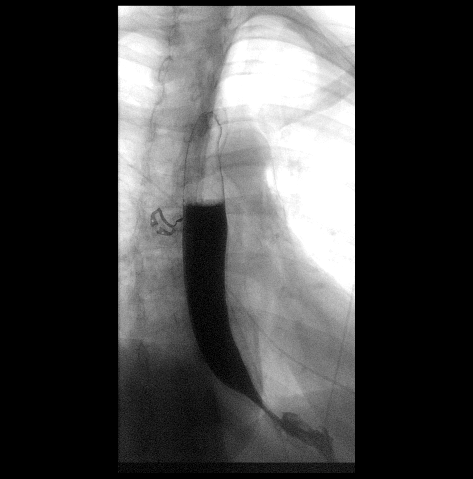
[frame 23/27]
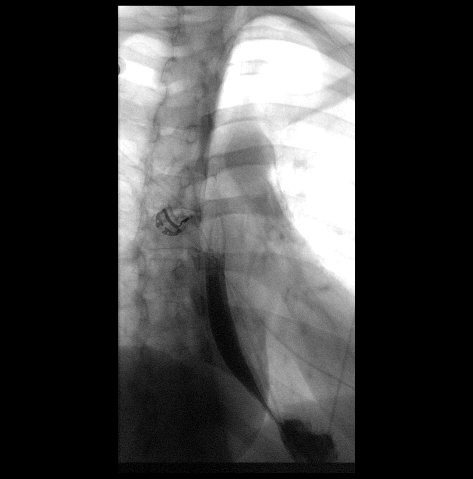

[Series 16: cp_standard · 0.34mm/px · 4 of 24 frames shown (2 of 5)]
[frame 4/24]
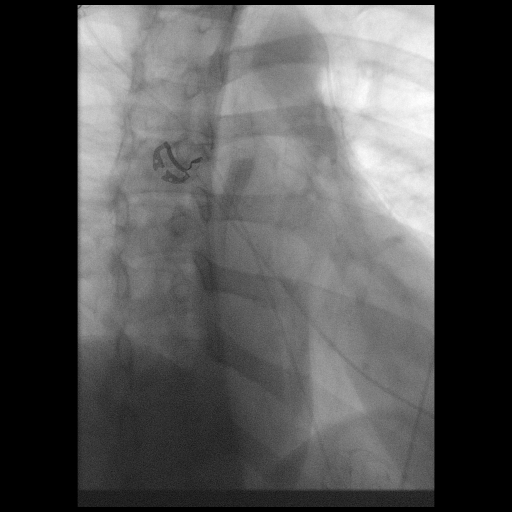
[frame 13/24]
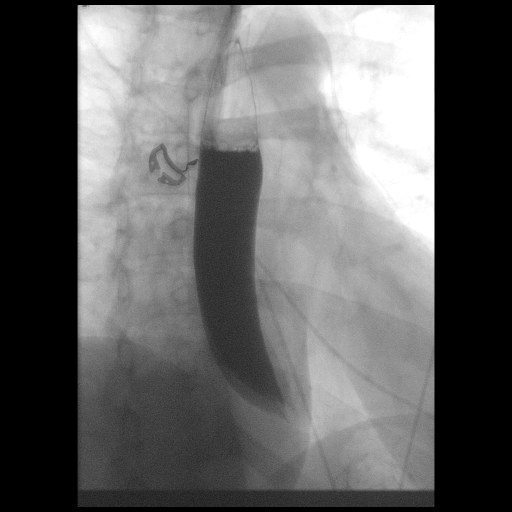
[frame 21/24]
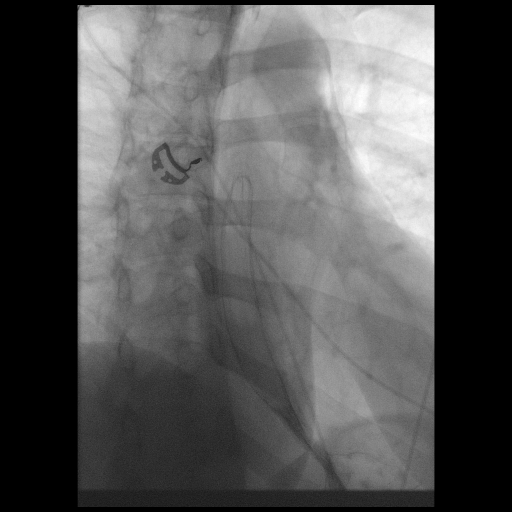
[frame 22/24]
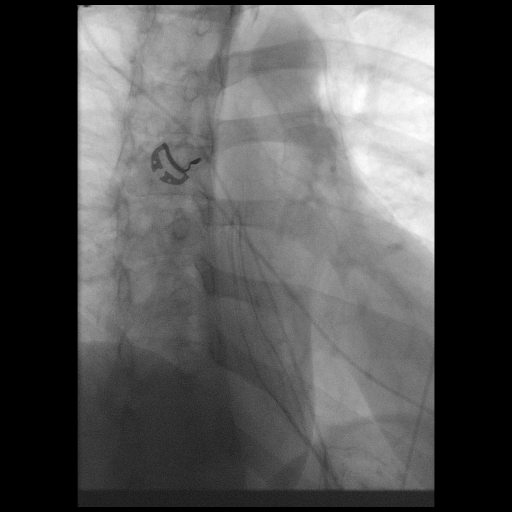

[Series 17: cp_standard · 0.34mm/px · 4 of 31 frames shown (3 of 5)]
[frame 5/31]
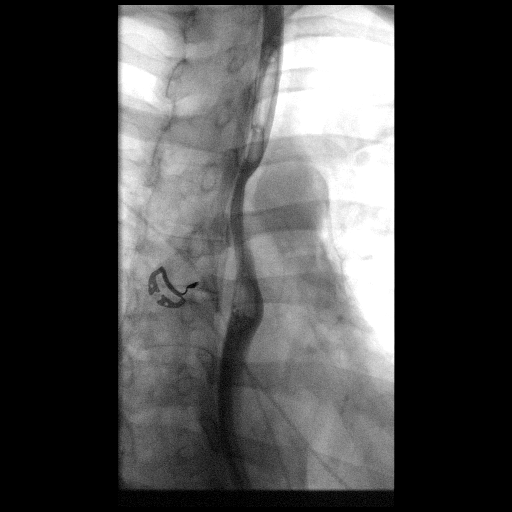
[frame 16/31]
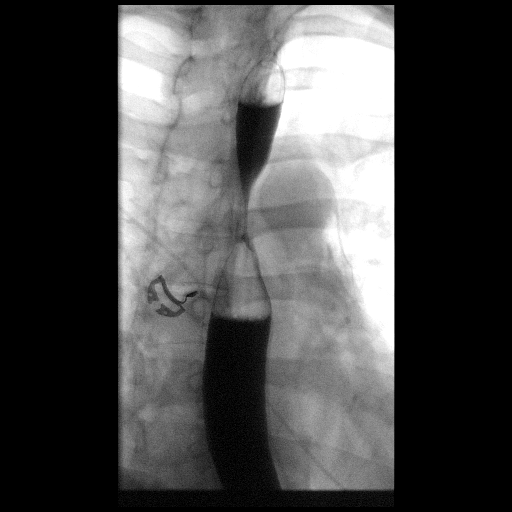
[frame 26/31]
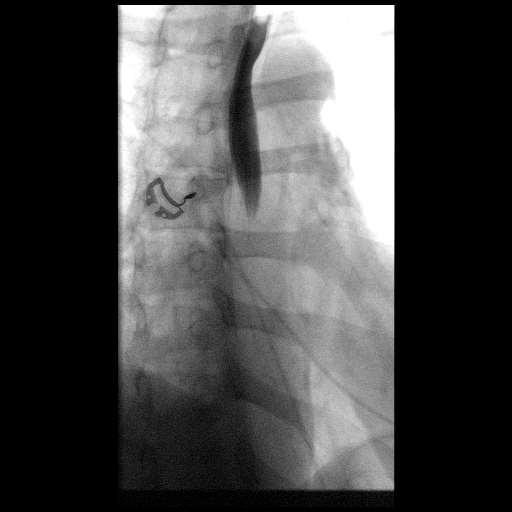
[frame 27/31]
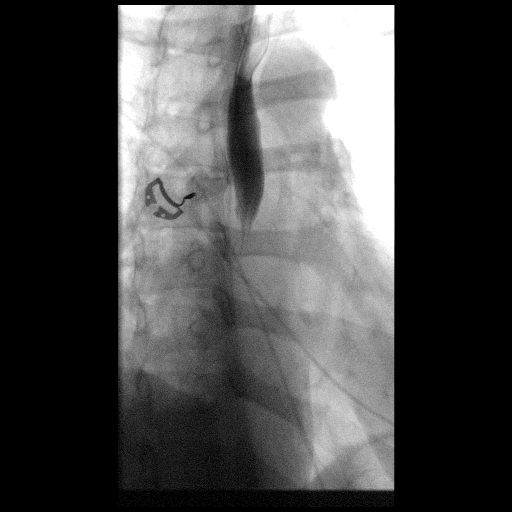

[Series 18: cp_standard · 0.34mm/px · 4 of 12 frames shown (4 of 5)]
[frame 1/12]
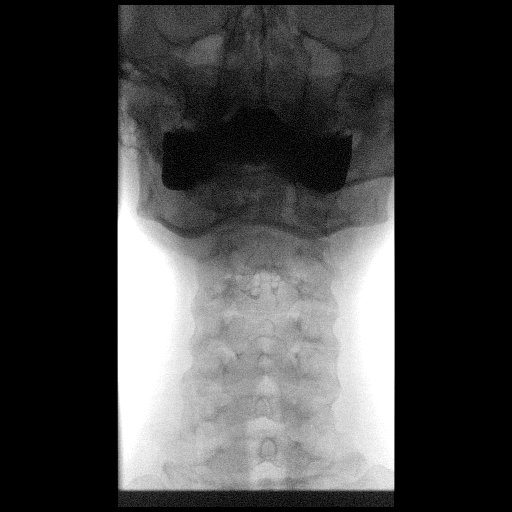
[frame 2/12]
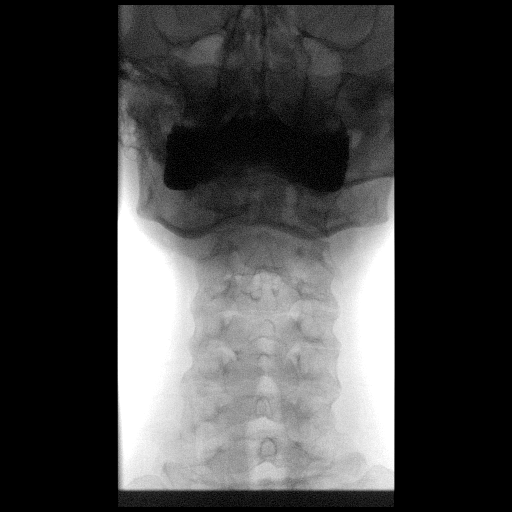
[frame 7/12]
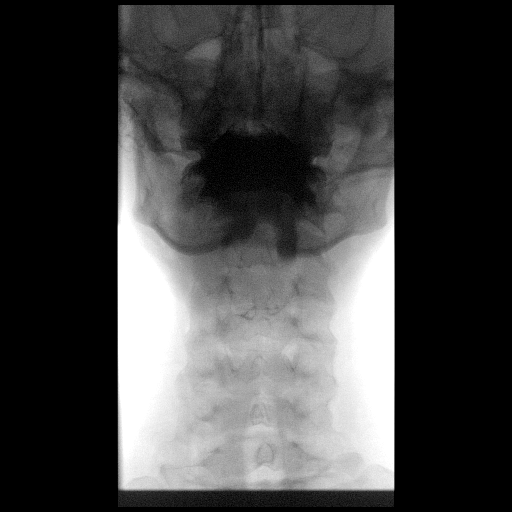
[frame 11/12]
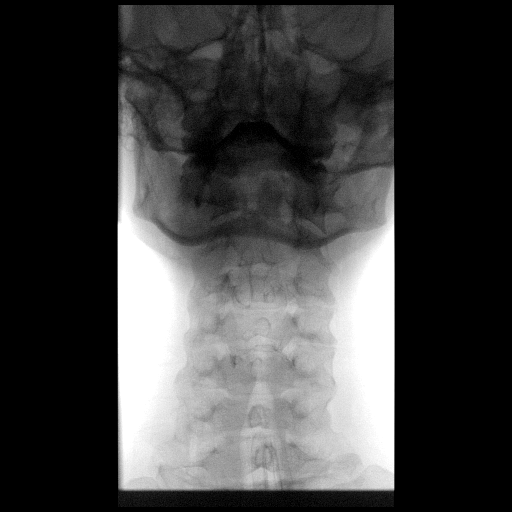

[Series 19: cp_standard · 0.34mm/px · 4 of 10 frames shown (5 of 5)]
[frame 2/10]
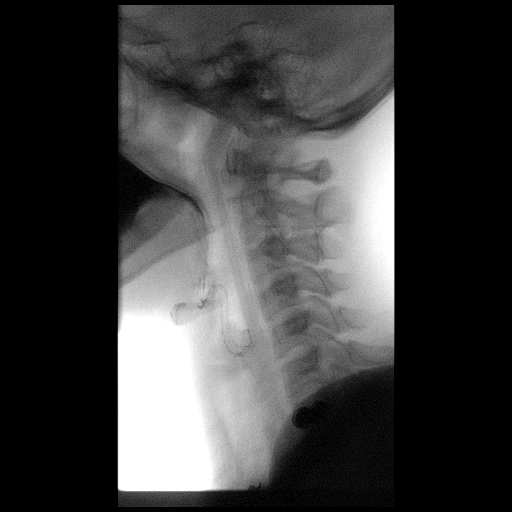
[frame 6/10]
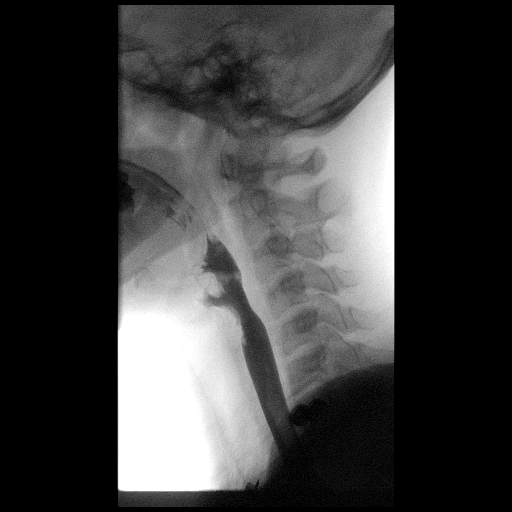
[frame 9/10]
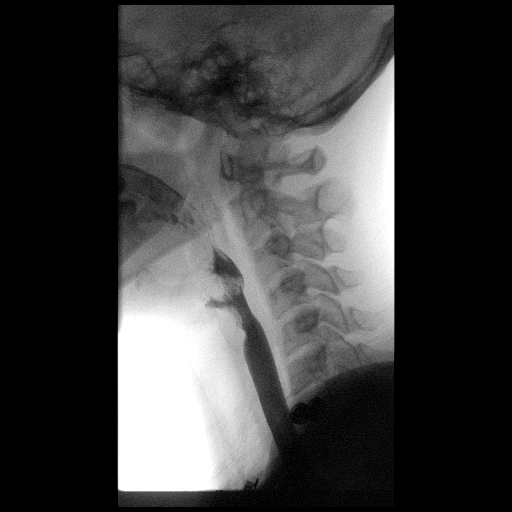
[frame 10/10]
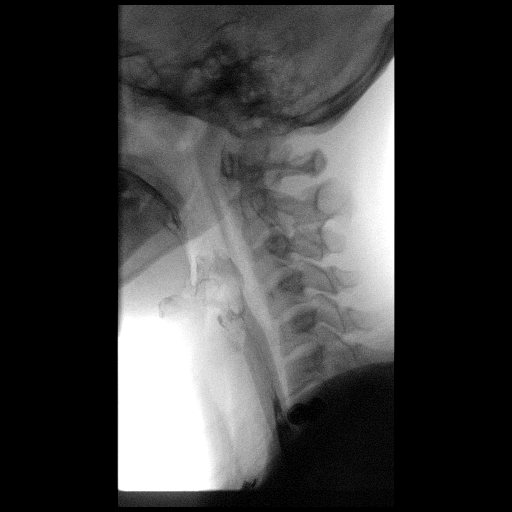

[20 of 20 positions shown; findings below may reference images not displayed]

FINDINGS: The pharyngeal phase of swallowing was normal.

Primary peristaltic waves in the esophagus were normal. No
obstruction to the forward flow of contrast throughout the esophagus
and into the stomach.

Normal esophageal course and contour. Normal esophageal mucosal
pattern. No esophageal stricture, ulceration, leak, or other
significant abnormality.

No hiatal hernia. No gastroesophageal reflux occurred spontaneously
or was elicited.
IMPRESSION: 1. Normal esophagram.  No evidence of leak.

## 2020-02-25 ENCOUNTER — Other Ambulatory Visit: Payer: Self-pay | Admitting: Family Medicine

## 2020-02-25 DIAGNOSIS — K581 Irritable bowel syndrome with constipation: Secondary | ICD-10-CM

## 2020-02-25 DIAGNOSIS — R110 Nausea: Secondary | ICD-10-CM

## 2020-02-25 DIAGNOSIS — R109 Unspecified abdominal pain: Secondary | ICD-10-CM

## 2020-02-25 NOTE — Telephone Encounter (Signed)
Please see message and advise.  Thank you. ° °

## 2020-03-06 ENCOUNTER — Other Ambulatory Visit: Payer: Self-pay | Admitting: Family Medicine

## 2020-03-06 DIAGNOSIS — K581 Irritable bowel syndrome with constipation: Secondary | ICD-10-CM

## 2020-03-06 DIAGNOSIS — R110 Nausea: Secondary | ICD-10-CM

## 2020-03-06 DIAGNOSIS — R109 Unspecified abdominal pain: Secondary | ICD-10-CM

## 2020-03-14 ENCOUNTER — Other Ambulatory Visit: Payer: Self-pay | Admitting: Gastroenterology

## 2020-03-14 DIAGNOSIS — K581 Irritable bowel syndrome with constipation: Secondary | ICD-10-CM

## 2020-03-14 DIAGNOSIS — R109 Unspecified abdominal pain: Secondary | ICD-10-CM

## 2020-03-14 DIAGNOSIS — R110 Nausea: Secondary | ICD-10-CM

## 2020-03-22 ENCOUNTER — Other Ambulatory Visit: Payer: Self-pay | Admitting: Gastroenterology

## 2020-03-22 DIAGNOSIS — R110 Nausea: Secondary | ICD-10-CM

## 2020-03-22 DIAGNOSIS — R109 Unspecified abdominal pain: Secondary | ICD-10-CM

## 2020-03-22 DIAGNOSIS — K581 Irritable bowel syndrome with constipation: Secondary | ICD-10-CM

## 2020-05-01 ENCOUNTER — Other Ambulatory Visit: Payer: Self-pay | Admitting: Gastroenterology

## 2020-05-01 DIAGNOSIS — K581 Irritable bowel syndrome with constipation: Secondary | ICD-10-CM

## 2020-05-01 DIAGNOSIS — R109 Unspecified abdominal pain: Secondary | ICD-10-CM

## 2020-05-01 DIAGNOSIS — R110 Nausea: Secondary | ICD-10-CM

## 2020-05-03 ENCOUNTER — Telehealth: Payer: Self-pay | Admitting: Gastroenterology

## 2020-05-03 DIAGNOSIS — R110 Nausea: Secondary | ICD-10-CM

## 2020-05-03 DIAGNOSIS — R109 Unspecified abdominal pain: Secondary | ICD-10-CM

## 2020-05-03 DIAGNOSIS — K581 Irritable bowel syndrome with constipation: Secondary | ICD-10-CM

## 2020-05-03 NOTE — Telephone Encounter (Signed)
Pt is requesting a refill on her Zofran (solid)  [address]

## 2020-05-04 MED ORDER — ONDANSETRON 4 MG PO TBDP: 4.0000 mg | ORAL_TABLET | Freq: Four times a day (QID) | ORAL | 5 refills | Status: DC | PRN

## 2020-05-04 NOTE — Telephone Encounter (Signed)
Would you like to refill this medication?  

## 2020-05-04 NOTE — Telephone Encounter (Signed)
Okay to refill.  60-day supply with 5 refills.  Thank you.

## 2020-05-04 NOTE — Telephone Encounter (Signed)
I have sent prescription to patient's pharmacy.  

## 2020-05-09 ENCOUNTER — Encounter (HOSPITAL_BASED_OUTPATIENT_CLINIC_OR_DEPARTMENT_OTHER): Payer: Self-pay | Admitting: Radiology

## 2020-05-09 ENCOUNTER — Emergency Department (HOSPITAL_BASED_OUTPATIENT_CLINIC_OR_DEPARTMENT_OTHER)
Admission: EM | Admit: 2020-05-09 | Discharge: 2020-05-09 | Disposition: A | Discharge status: Home / Self Care | Payer: BLUE CROSS/BLUE SHIELD | Attending: Emergency Medicine | Admitting: Emergency Medicine

## 2020-05-09 ENCOUNTER — Other Ambulatory Visit: Payer: Self-pay

## 2020-05-09 ENCOUNTER — Emergency Department (HOSPITAL_BASED_OUTPATIENT_CLINIC_OR_DEPARTMENT_OTHER): Payer: BLUE CROSS/BLUE SHIELD

## 2020-05-09 DIAGNOSIS — R112 Nausea with vomiting, unspecified: Secondary | ICD-10-CM | POA: Diagnosis not present

## 2020-05-09 DIAGNOSIS — R1084 Generalized abdominal pain: Secondary | ICD-10-CM | POA: Diagnosis present

## 2020-05-09 DIAGNOSIS — K581 Irritable bowel syndrome with constipation: Secondary | ICD-10-CM | POA: Insufficient documentation

## 2020-05-09 DIAGNOSIS — Z87891 Personal history of nicotine dependence: Secondary | ICD-10-CM | POA: Insufficient documentation

## 2020-05-09 LAB — URINALYSIS, ROUTINE W REFLEX MICROSCOPIC
Bilirubin Urine: NEGATIVE
Glucose, UA: NEGATIVE mg/dL
Hgb urine dipstick: NEGATIVE
Ketones, ur: 40 mg/dL — AB
Leukocytes,Ua: NEGATIVE
Nitrite: NEGATIVE
Specific Gravity, Urine: 1.025 (ref 1.005–1.030)
pH: 7 (ref 5.0–8.0)

## 2020-05-09 LAB — CBC
HCT: 37.7 % (ref 36.0–46.0)
Hemoglobin: 12.9 g/dL (ref 12.0–15.0)
MCH: 31.1 pg (ref 26.0–34.0)
MCHC: 34.2 g/dL (ref 30.0–36.0)
MCV: 90.8 fL (ref 80.0–100.0)
Platelets: 221 10*3/uL (ref 150–400)
RBC: 4.15 MIL/uL (ref 3.87–5.11)
RDW: 13 % (ref 11.5–15.5)
WBC: 14.9 10*3/uL — ABNORMAL HIGH (ref 4.0–10.5)
nRBC: 0 % (ref 0.0–0.2)

## 2020-05-09 LAB — COMPREHENSIVE METABOLIC PANEL
ALT: 13 U/L (ref 0–44)
AST: 15 U/L (ref 15–41)
Albumin: 4.3 g/dL (ref 3.5–5.0)
Alkaline Phosphatase: 41 U/L (ref 38–126)
Anion gap: 10 (ref 5–15)
BUN: 12 mg/dL (ref 6–20)
CO2: 26 mmol/L (ref 22–32)
Calcium: 8.9 mg/dL (ref 8.9–10.3)
Chloride: 100 mmol/L (ref 98–111)
Creatinine, Ser: 0.74 mg/dL (ref 0.44–1.00)
GFR, Estimated: >60 mL/min (ref >60)
Glucose, Bld: 148 mg/dL — ABNORMAL HIGH (ref 70–99)
Potassium: 4.3 mmol/L (ref 3.5–5.1)
Sodium: 136 mmol/L (ref 135–145)
Total Bilirubin: 0.7 mg/dL (ref 0.3–1.2)
Total Protein: 7.2 g/dL (ref 6.5–8.1)

## 2020-05-09 LAB — URINALYSIS, MICROSCOPIC (REFLEX)

## 2020-05-09 LAB — PREGNANCY, URINE: Preg Test, Ur: NEGATIVE

## 2020-05-09 LAB — LIPASE, BLOOD: Lipase: 22 U/L (ref 11–51)

## 2020-05-09 MED ORDER — FENTANYL CITRATE (PF) 100 MCG/2ML IJ SOLN
50.0000 ug | Freq: Once | INTRAMUSCULAR | Status: AC
  Administered 2020-05-09: 50 ug via INTRAVENOUS
  Filled 2020-05-09: qty 2

## 2020-05-09 MED ORDER — DICYCLOMINE HCL 10 MG PO CAPS
10.0000 mg | ORAL_CAPSULE | Freq: Once | ORAL | Status: AC
  Administered 2020-05-09: 10 mg via ORAL
  Filled 2020-05-09: qty 1

## 2020-05-09 MED ORDER — DROPERIDOL 2.5 MG/ML IJ SOLN
2.5000 mg | Freq: Once | INTRAMUSCULAR | Status: AC
  Administered 2020-05-09: 2.5 mg via INTRAVENOUS
  Filled 2020-05-09: qty 2

## 2020-05-09 MED ORDER — MORPHINE SULFATE (PF) 4 MG/ML IV SOLN
4.0000 mg | Freq: Once | INTRAVENOUS | Status: AC
  Administered 2020-05-09: 4 mg via INTRAVENOUS
  Filled 2020-05-09: qty 1

## 2020-05-09 MED ORDER — SODIUM CHLORIDE 0.9 % IV BOLUS
500.0000 mL | Freq: Once | INTRAVENOUS | Status: AC
  Administered 2020-05-09: 500 mL via INTRAVENOUS

## 2020-05-09 MED ORDER — IOHEXOL 300 MG/ML  SOLN
100.0000 mL | Freq: Once | INTRAMUSCULAR | Status: AC | PRN
  Administered 2020-05-09: 100 mL via INTRAVENOUS

## 2020-05-09 MED ORDER — ONDANSETRON HCL 4 MG/2ML IJ SOLN
4.0000 mg | Freq: Once | INTRAMUSCULAR | Status: AC
  Administered 2020-05-09: 4 mg via INTRAVENOUS
  Filled 2020-05-09: qty 2

## 2020-05-09 MED ORDER — SODIUM CHLORIDE 0.9 % IV BOLUS
1000.0000 mL | Freq: Once | INTRAVENOUS | Status: AC
  Administered 2020-05-09: 1000 mL via INTRAVENOUS

## 2020-05-09 NOTE — Discharge Instructions (Addendum)
You are seen for today for abdominal pain, as we discussed your work-up was reassuring.  Continue to take your Bentyl, use the attached instructions on diet for IBS.  I also would highly recommend stop smoking marijuana.  This could be contributing to your symptoms.  Make sure to stay hydrated.  If you have any new worsening concerning symptom please come back to the emergency department.  Please follow-up with your GI doctor/PCP in the next couple days.

## 2020-05-09 NOTE — ED Notes (Signed)
Pt states feeling much better, ready for discharge. Pain resolved.  Pt has follow up with GI planned.

## 2020-05-09 NOTE — ED Notes (Signed)
Provider aware of pt request for additional pain medication

## 2020-05-09 NOTE — ED Notes (Signed)
Pt ambulated to bathroom with steady gait. 

## 2020-05-09 NOTE — ED Notes (Signed)
Provider made aware of pts request for pain medication

## 2020-05-09 NOTE — ED Triage Notes (Signed)
Vomiting and abd pain since getting vaccine yesterday she staes has hx of chrons

## 2020-05-09 NOTE — ED Notes (Signed)
PT given specimen cup and educated on collection for UA

## 2020-05-09 NOTE — ED Notes (Signed)
Pt continues to request additional pain medication, provider aware, pending effect of ordered medication.  Pt requests to speak to provider about pain control

## 2020-05-09 NOTE — ED Notes (Signed)
Pt states pain briefly improved to 8/10 now back to 9/10

## 2020-05-09 NOTE — ED Notes (Signed)
Pt unable to urinate at this time.  

## 2020-05-09 NOTE — ED Notes (Signed)
PT extremely loud having runs of emesis in lobby. Registration stated they observed pt putting fingers down throat. PT keeps face hidden when in lobby.

## 2020-05-09 NOTE — ED Provider Notes (Signed)
[facility] EMERGENCY DEPARTMENT Provider Note   CSN: [id number] Arrival date & time: 05/09/20  1040     History Chief Complaint  Patient presents with  . Abdominal Pain  . Emesis    Samantha Noble is a 35 y.o. female with past medical history of GERD, pancreatitis, IBS,  gastritis that presents emergency department today for nausea vomiting and abdominal pain.  Patient states that pain started after she got Covid vaccination yesterday which is when her symptoms started.  Patient is hysterically crying and bad and gagging.  Emesis bag is empty.  Patient states that abdominal pain is generalized.  Denies any prior abdominal surgery.  Patient states that she did have an endoscopy and colonoscopy in October which were normal.  Has never been formally diagnosed with Crohn's.  Patient states that she took her Bentyl without much relief this morning.  Patient states that she does have multiple episodes of abdominal pain with similar symptoms.  Denies any diarrhea, does admit to some constipation which is common for her.  Denies any fevers or chills.  Denies any chest pain or shortness of breath.  Denies any dysuria or hematuria.  Denies any vaginal bleeding or vaginal discharge.  No chance of pregnancy or STDs.  Patient does admit to smoking 3 blunts of marijuana daily, denies any other substance abuse or alcohol use.     Nursing notes reviewed, registration stated that they observe patient putting fingers down her throat.  HPI     Past Medical History:  Diagnosis Date  . Acid reflux disease   . Depression   . Gastritis   . GERD (gastroesophageal reflux disease)   . IBS (irritable bowel syndrome)   . Pancreatitis     Patient Active Problem List   Diagnosis Date Noted  . Pneumomediastinum ([place]) 08/17/2018  . Retroperitoneal air   . Abdominal pain   . Intra-abdominal free air of unknown etiology 08/13/2018    Past Surgical History:  Procedure Laterality Date  . NO  PAST SURGERIES    . WISDOM TOOTH EXTRACTION       OB History    Gravida  0   Para      Term      Preterm      AB      Living        SAB      IAB      Ectopic      Multiple      Live Births              Family History  Problem Relation Age of Onset  . Breast cancer Mother   . Irritable bowel syndrome Maternal Grandmother   . Crohn's disease Paternal Uncle   . Colon cancer Neg Hx   . Esophageal cancer Neg Hx   . Rectal cancer Neg Hx   . Stomach cancer Neg Hx     Social History   Tobacco Use  . Smoking status: Former Smoker    Packs/day: 0.10    Years: 2.00    Pack years: 0.20    Types: Cigarettes    Quit date: 07/2018    Years since quitting: 1.8  . Smokeless tobacco: Never Used  . Tobacco comment: Not cuuently  Vaping Use  . Vaping Use: Former  Substance Use Topics  . Alcohol use: Yes    Comment: 01/18/2019 was the last day she dranked   . Drug use: Yes    Types: Marijuana  Comment: last smoked marijuana 01-20-2019    Home Medications Prior to Admission medications   Medication Sig Start Date End Date Taking? Authorizing Provider  acetaminophen (TYLENOL) 500 MG tablet Take 500 mg by mouth every 6 (six) hours as needed for mild pain.    [provider]  dicyclomine (BENTYL) 10 MG capsule TAKE 1 CAPSULE (10 MG TOTAL) BY MOUTH EVERY 6 (SIX) HOURS AS NEEDED FOR SPASMS. 03/08/20   Cirigliano, Vito V, DO  ondansetron (ZOFRAN-ODT) 4 MG disintegrating tablet Take 1 tablet (4 mg total) by mouth every 6 (six) hours as needed for nausea. 05/04/20   Cirigliano, Vito V, DO    Allergies    Zostrix [capsaicin]  Review of Systems   Review of Systems  Constitutional: Negative for chills, diaphoresis, fatigue and fever.  HENT: Negative for congestion, sore throat and trouble swallowing.   Eyes: Negative for pain and visual disturbance.  Respiratory: Negative for cough, shortness of breath and wheezing.   Cardiovascular: Negative for chest  pain, palpitations and leg swelling.  Gastrointestinal: Positive for abdominal pain, constipation, nausea and vomiting. Negative for abdominal distention and diarrhea.  Genitourinary: Negative for difficulty urinating.  Musculoskeletal: Negative for back pain, neck pain and neck stiffness.  Skin: Negative for pallor.  Neurological: Negative for dizziness, speech difficulty, weakness and headaches.  Psychiatric/Behavioral: Negative for confusion.    Physical Exam Updated Vital Signs BP 121/74 (BP Location: Right Arm)   Pulse 75   Temp 98.1 F (36.7 C) (Oral)   Resp 14   Ht 5\' 4"  (1.626 m)   Wt 63.5 kg   SpO2 99%   BMI 24.03 kg/m   Physical Exam Constitutional:      General: She is in acute distress.     Appearance: Normal appearance. She is not ill-appearing, toxic-appearing or diaphoretic.     Comments: Patient is hysterically crying and gagging.  No emesis in emesis basin.  HENT:     Mouth/Throat:     Mouth: Mucous membranes are moist.     Pharynx: Oropharynx is clear.  Eyes:     General: No scleral icterus.    Extraocular Movements: Extraocular movements intact.     Pupils: Pupils are equal, round, and reactive to light.  Cardiovascular:     Rate and Rhythm: Normal rate and regular rhythm.     Pulses: Normal pulses.     Heart sounds: Normal heart sounds.  Pulmonary:     Effort: Pulmonary effort is normal. No respiratory distress.     Breath sounds: Normal breath sounds. No stridor. No wheezing, rhonchi or rales.  Chest:     Chest wall: No tenderness.  Abdominal:     General: Abdomen is flat. There is no distension.     Palpations: Abdomen is soft.     Tenderness: There is abdominal tenderness (Generalized ). There is no guarding or rebound.     Comments: Generalized abdominal tenderness throughout abdomen.  No guarding or rigid abdomen.  Musculoskeletal:        General: No swelling or tenderness. Normal range of motion.     Cervical back: Normal range of motion  and neck supple. No rigidity.     Right lower leg: No edema.     Left lower leg: No edema.  Skin:    General: Skin is warm and dry.     Capillary Refill: Capillary refill takes less than 2 seconds.     Coloration: Skin is not pale.  Neurological:  General: No focal deficit present.     Mental Status: She is alert and oriented to person, place, and time.  Psychiatric:        Mood and Affect: Mood normal.        Behavior: Behavior normal.     ED Results / Procedures / Treatments   Labs (all labs ordered are listed, but only abnormal results are displayed) Labs Reviewed  COMPREHENSIVE METABOLIC PANEL - Abnormal; Notable for the following components:      Result Value   Glucose, Bld 148 (*)    All other components within normal limits  CBC - Abnormal; Notable for the following components:   WBC 14.9 (*)    All other components within normal limits  URINALYSIS, ROUTINE W REFLEX MICROSCOPIC - Abnormal; Notable for the following components:   APPearance HAZY (*)    Ketones, ur 40 (*)    Protein, ur TRACE (*)    All other components within normal limits  URINALYSIS, MICROSCOPIC (REFLEX) - Abnormal; Notable for the following components:   Bacteria, UA FEW (*)    All other components within normal limits  LIPASE, BLOOD  PREGNANCY, URINE    EKG EKG Interpretation  Date/Time:  Monday May 09 2020 13:29:45 EST Ventricular Rate:  72 PR Interval:    QRS Duration: 64 QT Interval:  399 QTC Calculation: 437 R Axis:   73 Text Interpretation: Sinus rhythm normal intervals No significant change since last tracing Confirmed by Aletta Edouard [phone]) on 05/09/2020 1:32:30 PM   Radiology CT Abdomen Pelvis W Contrast  Result Date: 05/09/2020 CLINICAL DATA:  Abdominal pain that began yesterday following vaccination by report. EXAM: CT ABDOMEN AND PELVIS WITH CONTRAST TECHNIQUE: Multidetector CT imaging of the abdomen and pelvis was performed using the standard protocol following  bolus administration of intravenous contrast. CONTRAST:  125mL OMNIPAQUE IOHEXOL 300 MG/ML  SOLN COMPARISON:  Aug 17, 2018 FINDINGS: Lower chest: Lung bases are clear. Hepatobiliary: Mildly heterogeneous hepatic enhancement likely related to phase of contrast with variable hepatic enhancement over a series of prior studies. No pericholecystic stranding. No biliary duct distension. Pancreas: Normal, without mass, inflammation or ductal dilatation. Spleen: Normal spleen. Adrenals/Urinary Tract: Adrenal glands are normal. There is symmetric renal enhancement. No sign of hydronephrosis. The smooth renal contours. Smooth contour the urinary bladder. Stomach/Bowel: Appendix not clearly visualized, perhaps small tubular structure partially imaged arising from cecal tip on image a 34 of series 5 corresponding to image 55 of series 6. No secondary signs to suggest acute appendicitis about the cecum. Stomach and small bowel without acute process. Under distension of proximal transverse colon. Vascular/Lymphatic: Patent abdominal vasculature. Normal caliber abdominal aorta. There is no gastrohepatic or hepatoduodenal ligament lymphadenopathy. No retroperitoneal or mesenteric lymphadenopathy. No pelvic sidewall lymphadenopathy. Reproductive: Corpus luteum cyst in the RIGHT adnexa. The no adnexal mass. Trace fluid in the pelvis likely physiologic. Other: No free air.  No signs of significant ascites. Musculoskeletal: No acute musculoskeletal process. No destructive bone finding. IMPRESSION: 1. Corpus luteum cyst in the RIGHT adnexa. Trace fluid in the pelvis likely physiologic. 2. No acute findings in the abdomen or pelvis. Electronically Signed   By: Zetta Bills M.D.   On: 05/09/2020 15:36    Procedures Procedures   Medications Ordered in ED Medications  sodium chloride 0.9 % bolus 1,000 mL (0 mLs Intravenous Stopped 05/09/20 1420)  ondansetron (ZOFRAN) injection 4 mg (4 mg Intravenous Given 05/09/20 1317)  morphine  4 MG/ML injection 4 mg (4 mg Intravenous  Given 05/09/20 1317)  iohexol (OMNIPAQUE) 300 MG/ML solution 100 mL (100 mLs Intravenous Contrast Given 05/09/20 1456)  droperidol (INAPSINE) 2.5 MG/ML injection 2.5 mg (2.5 mg Intravenous Given 05/09/20 1504)  sodium chloride 0.9 % bolus 500 mL (0 mLs Intravenous Stopped 05/09/20 1817)  fentaNYL (SUBLIMAZE) injection 50 mcg (50 mcg Intravenous Given 05/09/20 1738)  dicyclomine (BENTYL) capsule 10 mg (10 mg Oral Given 05/09/20 1735)    ED Course  I have reviewed the triage vital signs and the nursing notes.  Pertinent labs & imaging results that were available during my care of the patient were reviewed by me and considered in my medical decision making (see chart for details).    MDM Rules/Calculators/A&P                         Alonni ADONNA HORSLEY is a 35 y.o. female with past medical history of GERD, pancreatitis, IBS,  gastritis that presents emergency  department today for nausea vomiting and abdominal pain.  Differential to include IBS, colitis, gastroenteritis, acute GI pathology such as appendicitis, gastro paresis, cannabis emesis syndrome, UTI.  Initial interventions include IV fluids, Zofran and pain medication.  Work-up today shows CMP unremarkable, urinalysis does show some ketones..  Slightly dehydrated, will give fluid here.  White count 14.9.  CT abdomen pelvis without any abnormal intra-abdominal disease, appears unchanged.  Did speak to radiologist about this.  Upon reevaluation, patient states that she continues to have pain.  Did give droperidol since there is high suspicion for hyperemesis cannabis syndrome.  Repeat abdominal exam benign.  Upon reevaluation, patient states that she does feel better after medications given, no longer complaining of pain or crying, high suspicion for hyperemesis cannabis syndrome or IBS.  Shared decision-making on discontinuing marijuana use and following up with GI doctor.  Patient expressed understanding,  symptomatic treatment discussed.  Doubt need for further emergent work up at this time.  No concerns for acute abdominal pathology or sepsis.  I explained the diagnosis and have given explicit precautions to return to the ER including for any other new or worsening symptoms. The patient understands and accepts the medical plan as it's been dictated and I have answered their questions. Discharge instructions concerning home care and prescriptions have been given. The patient is STABLE and is discharged to home in good condition.   Final Clinical Impression(s) / ED Diagnoses Final diagnoses:  Irritable bowel syndrome with constipation    Rx / DC Orders ED Discharge Orders    None       Alfredia Client, PA-C 05/09/20 1912    Hayden Rasmussen, MD 05/09/20 2030

## 2020-05-09 NOTE — ED Notes (Signed)
Pt placed in room had very small amt vomit in bag givengown to change into, and blanket

## 2020-05-10 ENCOUNTER — Encounter (HOSPITAL_COMMUNITY): Payer: Self-pay

## 2020-05-10 ENCOUNTER — Emergency Department (HOSPITAL_COMMUNITY)
Admission: EM | Admit: 2020-05-10 | Discharge: 2020-05-11 | Disposition: A | Discharge status: Home / Self Care | Payer: BLUE CROSS/BLUE SHIELD | Attending: Emergency Medicine | Admitting: Emergency Medicine

## 2020-05-10 ENCOUNTER — Other Ambulatory Visit: Payer: Self-pay

## 2020-05-10 DIAGNOSIS — Z87891 Personal history of nicotine dependence: Secondary | ICD-10-CM | POA: Insufficient documentation

## 2020-05-10 DIAGNOSIS — R1115 Cyclical vomiting syndrome unrelated to migraine: Secondary | ICD-10-CM | POA: Insufficient documentation

## 2020-05-10 DIAGNOSIS — R1084 Generalized abdominal pain: Secondary | ICD-10-CM | POA: Diagnosis not present

## 2020-05-10 DIAGNOSIS — K219 Gastro-esophageal reflux disease without esophagitis: Secondary | ICD-10-CM | POA: Insufficient documentation

## 2020-05-10 DIAGNOSIS — Z8719 Personal history of other diseases of the digestive system: Secondary | ICD-10-CM | POA: Insufficient documentation

## 2020-05-10 DIAGNOSIS — R112 Nausea with vomiting, unspecified: Secondary | ICD-10-CM | POA: Diagnosis present

## 2020-05-10 LAB — COMPREHENSIVE METABOLIC PANEL
ALT: 14 U/L (ref 0–44)
AST: 13 U/L — ABNORMAL LOW (ref 15–41)
Albumin: 4.2 g/dL (ref 3.5–5.0)
Alkaline Phosphatase: 41 U/L (ref 38–126)
Anion gap: 8 (ref 5–15)
BUN: 11 mg/dL (ref 6–20)
CO2: 27 mmol/L (ref 22–32)
Calcium: 9.1 mg/dL (ref 8.9–10.3)
Chloride: 102 mmol/L (ref 98–111)
Creatinine, Ser: 0.87 mg/dL (ref 0.44–1.00)
GFR, Estimated: >60 mL/min (ref >60)
Glucose, Bld: 143 mg/dL — ABNORMAL HIGH (ref 70–99)
Potassium: 3.8 mmol/L (ref 3.5–5.1)
Sodium: 137 mmol/L (ref 135–145)
Total Bilirubin: 1 mg/dL (ref 0.3–1.2)
Total Protein: 7.1 g/dL (ref 6.5–8.1)

## 2020-05-10 LAB — I-STAT BETA HCG BLOOD, ED (MC, WL, AP ONLY): I-stat hCG, quantitative: <5 m[IU]/mL (ref <5)

## 2020-05-10 LAB — CBC
HCT: 38.4 % (ref 36.0–46.0)
Hemoglobin: 12.3 g/dL (ref 12.0–15.0)
MCH: 29.8 pg (ref 26.0–34.0)
MCHC: 32 g/dL (ref 30.0–36.0)
MCV: 93 fL (ref 80.0–100.0)
Platelets: 201 10*3/uL (ref 150–400)
RBC: 4.13 MIL/uL (ref 3.87–5.11)
RDW: 12.9 % (ref 11.5–15.5)
WBC: 10.4 10*3/uL (ref 4.0–10.5)
nRBC: 0 % (ref 0.0–0.2)

## 2020-05-10 LAB — LIPASE, BLOOD: Lipase: 25 U/L (ref 11–51)

## 2020-05-10 NOTE — ED Triage Notes (Addendum)
Pt reports LLQ abdominal pain since Sunday. Reports hx of IBS. Pt currently trying to self induce vomiting. Refuses Zofran.

## 2020-05-11 LAB — RAPID URINE DRUG SCREEN, HOSP PERFORMED
Amphetamines: POSITIVE — AB
Barbiturates: NOT DETECTED
Benzodiazepines: NOT DETECTED
Cocaine: NOT DETECTED
Opiates: POSITIVE — AB
Tetrahydrocannabinol: POSITIVE — AB

## 2020-05-11 LAB — URINALYSIS, ROUTINE W REFLEX MICROSCOPIC
Glucose, UA: NEGATIVE mg/dL
Hgb urine dipstick: NEGATIVE
Ketones, ur: 80 mg/dL — AB
Leukocytes,Ua: NEGATIVE
Nitrite: NEGATIVE
Protein, ur: 30 mg/dL — AB
Specific Gravity, Urine: 1.04 — ABNORMAL HIGH (ref 1.005–1.030)
pH: 5 (ref 5.0–8.0)

## 2020-05-11 MED ORDER — DIPHENHYDRAMINE HCL 50 MG/ML IJ SOLN
12.5000 mg | Freq: Once | INTRAMUSCULAR | Status: AC
  Administered 2020-05-11: 12.5 mg via INTRAVENOUS
  Filled 2020-05-11: qty 1

## 2020-05-11 MED ORDER — LIDOCAINE VISCOUS HCL 2 % MT SOLN
15.0000 mL | Freq: Once | OROMUCOSAL | Status: AC
  Administered 2020-05-11: 15 mL via ORAL
  Filled 2020-05-11: qty 15

## 2020-05-11 MED ORDER — HYOSCYAMINE SULFATE 0.125 MG SL SUBL
0.2500 mg | SUBLINGUAL_TABLET | Freq: Once | SUBLINGUAL | Status: AC
  Administered 2020-05-11: 0.25 mg via SUBLINGUAL
  Filled 2020-05-11: qty 2

## 2020-05-11 MED ORDER — PROCHLORPERAZINE EDISYLATE 10 MG/2ML IJ SOLN
10.0000 mg | Freq: Once | INTRAMUSCULAR | Status: AC
  Administered 2020-05-11: 10 mg via INTRAVENOUS
  Filled 2020-05-11: qty 2

## 2020-05-11 MED ORDER — FENTANYL CITRATE (PF) 100 MCG/2ML IJ SOLN
50.0000 ug | Freq: Once | INTRAMUSCULAR | Status: AC
  Administered 2020-05-11: 50 ug via INTRAVENOUS
  Filled 2020-05-11: qty 2

## 2020-05-11 MED ORDER — KETOROLAC TROMETHAMINE 15 MG/ML IJ SOLN
15.0000 mg | Freq: Once | INTRAMUSCULAR | Status: AC
  Administered 2020-05-11: 15 mg via INTRAVENOUS
  Filled 2020-05-11: qty 1

## 2020-05-11 MED ORDER — ALUM & MAG HYDROXIDE-SIMETH 200-200-20 MG/5ML PO SUSP
30.0000 mL | Freq: Once | ORAL | Status: AC
  Administered 2020-05-11: 30 mL via ORAL
  Filled 2020-05-11: qty 30

## 2020-05-11 MED ORDER — SODIUM CHLORIDE 0.9 % IV BOLUS
1000.0000 mL | Freq: Once | INTRAVENOUS | Status: AC
  Administered 2020-05-11: 1000 mL via INTRAVENOUS

## 2020-05-11 MED ORDER — METOCLOPRAMIDE HCL 5 MG/ML IJ SOLN
10.0000 mg | Freq: Once | INTRAMUSCULAR | Status: AC
  Administered 2020-05-11: 10 mg via INTRAVENOUS
  Filled 2020-05-11: qty 2

## 2020-05-11 MED ORDER — DROPERIDOL 2.5 MG/ML IJ SOLN
2.5000 mg | Freq: Once | INTRAMUSCULAR | Status: AC
  Administered 2020-05-11: 2.5 mg via INTRAVENOUS
  Filled 2020-05-11: qty 2

## 2020-05-11 NOTE — ED Notes (Signed)
Went in to check on patient and found her sitting in the sink naked. Patient provided dry gown and directed back to bed. Patient encouraged not to get up and to use call bell when assistance is needed.

## 2020-05-11 NOTE — ED Notes (Signed)
Patient refusing vitals at this time. Tried to encourage patient to keep equipment on, but she is unable to sit still and refuses to leave on cuff, EKG leads, and O2 monitor.

## 2020-05-11 NOTE — ED Notes (Signed)
After medication administration patient found with her fingers in her mouth attempting to throw up. Instructed patient on the importance of refraining from this behavior due to the possibility of increased abdominal pain. Patient voices understanding.

## 2020-05-11 NOTE — ED Notes (Signed)
Patient heard dry heaving and loudly gagging by this nurse. After going into room to check on patient, patient was found with her fingers in her mouth attempting to make herself throw up again. Patient once again encouraged not to do this.

## 2020-05-11 NOTE — ED Notes (Signed)
Informed patient that her door would need to be left open to ensure she is not attempting to make herself throw up. Patient then placed the blankets over her head and dry heaving continues.

## 2020-05-11 NOTE — ED Provider Notes (Signed)
[facility] DEPT Provider Note  CSN: [id number] Arrival date & time: 05/10/20 2109  Chief Complaint(s) Abdominal Pain  HPI Samantha Noble is a 35 y.o. female with a past medical history listed below who presents with several days of generalized abdominal discomfort with associated nausea and nonbloody nonbilious emesis.  Patient was evaluated yesterday at [facility].  Work-up was otherwise reassuring with a negative CT scan.  Patient symptoms were attributed to likely cannabinoid hyperemesis and she was treated symptomatically.  Ultimately improved and was discharged home.  Returns today for return symptoms. Reports pain is severe cramping sensation. Worse with emesis. Alleviated by heat packs and hot showers. Patient reports that the pain radiates up into her chest. No shortness of breath. No other physical complaints.   Abdominal Pain   Past Medical History Past Medical History:  Diagnosis Date  . Acid reflux disease   . Depression   . Gastritis   . GERD (gastroesophageal reflux disease)   . IBS (irritable bowel syndrome)   . Pancreatitis    Patient Active Problem List   Diagnosis Date Noted  . Pneumomediastinum ([place]) 08/17/2018  . Retroperitoneal air   . Abdominal pain   . Intra-abdominal free air of unknown etiology 08/13/2018   Home Medication(s) Prior to Admission medications   Medication Sig Start Date End Date Taking? Authorizing Provider  acetaminophen (TYLENOL) 500 MG tablet Take 500 mg by mouth every 6 (six) hours as needed for mild pain.    [provider]  dicyclomine (BENTYL) 10 MG capsule TAKE 1 CAPSULE (10 MG TOTAL) BY MOUTH EVERY 6 (SIX) HOURS AS NEEDED FOR SPASMS. 03/08/20   Cirigliano, Vito V, DO  ondansetron (ZOFRAN-ODT) 4 MG disintegrating tablet Take 1 tablet (4 mg total) by mouth every 6 (six) hours as needed for nausea. 05/04/20   CiriglianoDominic Pea, DO                                                                                                                                     Past Surgical History Past Surgical History:  Procedure Laterality Date  . NO PAST SURGERIES    . WISDOM TOOTH EXTRACTION     Family History Family History  Problem Relation Age of Onset  . Breast cancer Mother   . Irritable bowel syndrome Maternal Grandmother   . Crohn's disease Paternal Uncle   . Colon cancer Neg Hx   . Esophageal cancer Neg Hx   . Rectal cancer Neg Hx   . Stomach cancer Neg Hx     Social History Social History   Tobacco Use  . Smoking status: Former Smoker    Packs/day: 0.10    Years: 2.00    Pack years: 0.20    Types: Cigarettes    Quit date: 07/2018    Years since quitting: 1.8  . Smokeless tobacco: Never Used  . Tobacco comment: Not cuuently  Vaping Use  . Vaping Use: Former  Substance Use Topics  . Alcohol use: Yes    Comment: 01/18/2019 was the last day she dranked   . Drug use: Yes    Types: Marijuana    Comment: last smoked marijuana 01-20-2019   Allergies Capsaicin  Review of Systems Review of Systems  Gastrointestinal: Positive for abdominal pain.   All other systems are reviewed and are negative for acute change except as noted in the HPI  Physical Exam Vital Signs  I have reviewed the triage vital signs BP 124/82   Pulse 92   Temp 98 F (36.7 C) (Oral)   Resp 18   SpO2 98%   Physical Exam Vitals reviewed.  Constitutional:      General: She is not in acute distress.    Appearance: She is well-developed and well-nourished. She is not diaphoretic.  HENT:     Head: Normocephalic and atraumatic.     Right Ear: External ear normal.     Left Ear: External ear normal.     Nose: Nose normal.  Eyes:     General: No scleral icterus.    Extraocular Movements: EOM normal.     Conjunctiva/sclera: Conjunctivae normal.  Neck:     Trachea: Phonation normal.  Cardiovascular:     Rate and Rhythm: Normal rate and regular rhythm.  Pulmonary:     Effort:  Pulmonary effort is normal. No respiratory distress.     Breath sounds: No stridor.  Abdominal:     General: There is no distension.     Tenderness: There is generalized abdominal tenderness. There is no guarding or rebound.  Musculoskeletal:        General: No edema. Normal range of motion.     Cervical back: Normal range of motion.  Neurological:     Mental Status: She is alert and oriented to person, place, and time.  Psychiatric:        Mood and Affect: Mood and affect normal.        Behavior: Behavior normal.     ED Results and Treatments Labs (all labs ordered are listed, but only abnormal results are displayed) Labs Reviewed  COMPREHENSIVE METABOLIC PANEL - Abnormal; Notable for the following components:      Result Value   Glucose, Bld 143 (*)    AST 13 (*)    All other components within normal limits  URINALYSIS, ROUTINE W REFLEX MICROSCOPIC - Abnormal; Notable for the following components:   APPearance HAZY (*)    Specific Gravity, Urine 1.040 (*)    Bilirubin Urine SMALL (*)    Ketones, ur 80 (*)    Protein, ur 30 (*)    Bacteria, UA RARE (*)    All other components within normal limits  RAPID URINE DRUG SCREEN, HOSP PERFORMED - Abnormal; Notable for the following components:   Opiates POSITIVE (*)    Amphetamines POSITIVE (*)    Tetrahydrocannabinol POSITIVE (*)    All other components within normal limits  LIPASE, BLOOD  CBC  I-STAT BETA HCG BLOOD, ED ([facility], [facility], [facility] ONLY)  EKG  EKG Interpretation  Date/Time:  Wednesday May 11 2020 05:06:25 EST Ventricular Rate:  71 PR Interval:    QRS Duration: 66 QT Interval:  397 QTC Calculation: 432 R Axis:   59 Text Interpretation: Sinus rhythm Ventricular premature complex Baseline wander in lead(s) V1 Otherwise no significant change Confirmed by Addison Lank [phone]) on 05/11/2020 5:31:01 AM       Radiology No results found.  Pertinent labs & imaging results that were available during my care of the patient were reviewed by me and considered in my medical decision making (see chart for details).  Medications Ordered in ED Medications  sodium chloride 0.9 % bolus 1,000 mL (0 mLs Intravenous Stopped 05/11/20 0230)  metoCLOPramide (REGLAN) injection 10 mg (10 mg Intravenous Given 05/11/20 0102)  hyoscyamine (LEVSIN SL) SL tablet 0.25 mg (0.25 mg Sublingual Given 05/11/20 0102)  alum & mag hydroxide-simeth (MAALOX/MYLANTA) 200-200-20 MG/5ML suspension 30 mL (30 mLs Oral Given 05/11/20 0104)    And  lidocaine (XYLOCAINE) 2 % viscous mouth solution 15 mL (15 mLs Oral Given 05/11/20 0103)  droperidol (INAPSINE) 2.5 MG/ML injection 2.5 mg (2.5 mg Intravenous Given 05/11/20 0134)  ketorolac (TORADOL) 15 MG/ML injection 15 mg (15 mg Intravenous Given 05/11/20 0331)  prochlorperazine (COMPAZINE) injection 10 mg (10 mg Intravenous Given 05/11/20 0331)  diphenhydrAMINE (BENADRYL) injection 12.5 mg (12.5 mg Intravenous Given 05/11/20 0331)  alum & mag hydroxide-simeth (MAALOX/MYLANTA) 200-200-20 MG/5ML suspension 30 mL (30 mLs Oral Given 05/11/20 0441)    And  lidocaine (XYLOCAINE) 2 % viscous mouth solution 15 mL (15 mLs Oral Given 05/11/20 0441)  fentaNYL (SUBLIMAZE) injection 50 mcg (50 mcg Intravenous Given 05/11/20 0507)                                                                                                                                    Procedures .1-3 Lead EKG Interpretation Performed by: Fatima Blank, MD Authorized by: Fatima Blank, MD     Interpretation: normal     ECG rate:  82   ECG rate assessment: normal     Rhythm: sinus rhythm     Ectopy: none     Conduction: normal      (including critical care time)  Medical Decision Making / ED Course I have reviewed the nursing notes for this encounter and the patient's prior records (if available in EHR or on  provided paperwork).   IRENA SHADDIX was evaluated in Emergency Department on 05/11/2020 for the symptoms described in the history of present illness. She was evaluated in the context of the global COVID-19 pandemic, which necessitated consideration that the patient might be at risk for infection with the SARS-CoV-2 virus that causes COVID-19. Institutional protocols and algorithms that pertain to the evaluation of patients at risk for COVID-19 are in a state of rapid change based on information released by regulatory bodies including the CDC and federal and state  organizations. These policies and algorithms were followed during the patient's care in the ED.  Generalized abdominal discomfort. No evidence of peritonitis on exam. Recent CT imaging is negative. Repeat labs reassuring with no leukocytosis or anemia.  No significant electrolyte derangements or renal sufficiency.  No evidence of bili obstruction or pancreatitis.  UA without evidence of infection.  UDS positive for THC, amphetamines and opiates.  Patient immediately requesting narcotic pain medicine. Patient treated symptomatically with IV fluids, antiemetics and GI cocktail. Heat packs applied.  Patient was noted to be self inducing emesis by sticking her fingers in her throat on numerous occasions.  Patient confronted regarding her behavior.  Informed the narcotic medicine will not be the first line treatment, however she will be given additional meds if still symptomatic.  Patient was given droperidol which appeared to help during the previous encounter.  Additionally she was given Toradol, Compazine.  She reported minimal relief with the above, but was able to tolerate po. She was given a single dose of fentanyl.  On multiple reassessments she was found resting comfortably. No more emesis.      Final Clinical Impression(s) / ED Diagnoses Final diagnoses:  Cyclic vomiting syndrome    The patient appears reasonably  screened and/or stabilized for discharge and I doubt any other medical condition or other [facility] requiring further screening, evaluation, or treatment in the ED at this time prior to discharge. Safe for discharge with strict return precautions.  Disposition: Discharge  Condition: Good  I have discussed the results, Dx and Tx plan with the patient/family who expressed understanding and agree(s) with the plan. Discharge instructions discussed at length. The patient/family was given strict return precautions who verbalized understanding of the instructions. No further questions at time of discharge.    ED Discharge Orders    None      Follow Up: Gastroenterology  Call  To schedule an appointment for close follow up     This chart was dictated using voice recognition software.  Despite best efforts to proofread,  errors can occur which can change the documentation meaning.   Fatima Blank, MD 05/11/20 [id number]

## 2020-05-11 NOTE — ED Notes (Addendum)
Patient found sitting in the sink full of water. Patient assisted back to bed and educated that it is not safe and that we need to stay in the bed. Patient placed back on vital machine and given a new gown and sheets.

## 2020-05-11 NOTE — Discharge Instructions (Addendum)
Please do not attempt to self induce vomiting. This can make your pain and symptoms worse. Please use your nausea medicine and hydrates slowly throughout the day. Apply heat packs to your abdomen or take hot showers/baths for additional relief.  Continue to refrain from smoking or using THC products. Your symptoms will slowly resolve over several days to weeks.

## 2020-05-12 ENCOUNTER — Emergency Department (HOSPITAL_BASED_OUTPATIENT_CLINIC_OR_DEPARTMENT_OTHER)
Admission: EM | Admit: 2020-05-12 | Discharge: 2020-05-13 | Disposition: A | Discharge status: Home / Self Care | Payer: BLUE CROSS/BLUE SHIELD | Source: Home / Self Care | Attending: Emergency Medicine | Admitting: Emergency Medicine

## 2020-05-12 ENCOUNTER — Other Ambulatory Visit: Payer: Self-pay

## 2020-05-12 ENCOUNTER — Encounter (HOSPITAL_BASED_OUTPATIENT_CLINIC_OR_DEPARTMENT_OTHER): Payer: Self-pay | Admitting: Emergency Medicine

## 2020-05-12 ENCOUNTER — Encounter (HOSPITAL_BASED_OUTPATIENT_CLINIC_OR_DEPARTMENT_OTHER): Payer: Self-pay | Admitting: *Deleted

## 2020-05-12 ENCOUNTER — Encounter (HOSPITAL_COMMUNITY): Payer: Self-pay | Admitting: *Deleted

## 2020-05-12 ENCOUNTER — Emergency Department (HOSPITAL_COMMUNITY)
Admission: EM | Admit: 2020-05-12 | Discharge: 2020-05-12 | Disposition: A | Discharge status: Home / Self Care | Payer: BLUE CROSS/BLUE SHIELD | Attending: Emergency Medicine | Admitting: Emergency Medicine

## 2020-05-12 ENCOUNTER — Emergency Department (HOSPITAL_BASED_OUTPATIENT_CLINIC_OR_DEPARTMENT_OTHER)
Admission: EM | Admit: 2020-05-12 | Discharge: 2020-05-12 | Disposition: A | Discharge status: Home / Self Care | Payer: BLUE CROSS/BLUE SHIELD | Source: Home / Self Care | Attending: Emergency Medicine | Admitting: Emergency Medicine

## 2020-05-12 DIAGNOSIS — K5900 Constipation, unspecified: Secondary | ICD-10-CM | POA: Insufficient documentation

## 2020-05-12 DIAGNOSIS — R1084 Generalized abdominal pain: Secondary | ICD-10-CM | POA: Insufficient documentation

## 2020-05-12 DIAGNOSIS — R1032 Left lower quadrant pain: Secondary | ICD-10-CM | POA: Insufficient documentation

## 2020-05-12 DIAGNOSIS — Z87891 Personal history of nicotine dependence: Secondary | ICD-10-CM | POA: Insufficient documentation

## 2020-05-12 DIAGNOSIS — R109 Unspecified abdominal pain: Secondary | ICD-10-CM | POA: Insufficient documentation

## 2020-05-12 DIAGNOSIS — R112 Nausea with vomiting, unspecified: Secondary | ICD-10-CM | POA: Insufficient documentation

## 2020-05-12 DIAGNOSIS — F1210 Cannabis abuse, uncomplicated: Secondary | ICD-10-CM | POA: Insufficient documentation

## 2020-05-12 DIAGNOSIS — G8929 Other chronic pain: Secondary | ICD-10-CM | POA: Insufficient documentation

## 2020-05-12 DIAGNOSIS — K219 Gastro-esophageal reflux disease without esophagitis: Secondary | ICD-10-CM | POA: Diagnosis not present

## 2020-05-12 DIAGNOSIS — R509 Fever, unspecified: Secondary | ICD-10-CM | POA: Diagnosis not present

## 2020-05-12 DIAGNOSIS — Z5321 Procedure and treatment not carried out due to patient leaving prior to being seen by health care provider: Secondary | ICD-10-CM | POA: Insufficient documentation

## 2020-05-12 HISTORY — DX: Anxiety disorder, unspecified: F41.9

## 2020-05-12 LAB — I-STAT BETA HCG BLOOD, ED (MC, WL, AP ONLY): I-stat hCG, quantitative: <5 m[IU]/mL (ref <5)

## 2020-05-12 LAB — URINALYSIS, ROUTINE W REFLEX MICROSCOPIC
Bilirubin Urine: NEGATIVE
Glucose, UA: NEGATIVE mg/dL
Hgb urine dipstick: NEGATIVE
Ketones, ur: 80 mg/dL — AB
Leukocytes,Ua: NEGATIVE
Nitrite: NEGATIVE
Protein, ur: 100 mg/dL — AB
Specific Gravity, Urine: 1.03 (ref 1.005–1.030)
pH: 6 (ref 5.0–8.0)

## 2020-05-12 LAB — CBC
HCT: 34.3 % — ABNORMAL LOW (ref 36.0–46.0)
Hemoglobin: 11.8 g/dL — ABNORMAL LOW (ref 12.0–15.0)
MCH: 30.7 pg (ref 26.0–34.0)
MCHC: 34.4 g/dL (ref 30.0–36.0)
MCV: 89.3 fL (ref 80.0–100.0)
Platelets: 199 10*3/uL (ref 150–400)
RBC: 3.84 MIL/uL — ABNORMAL LOW (ref 3.87–5.11)
RDW: 12.6 % (ref 11.5–15.5)
WBC: 8.7 10*3/uL (ref 4.0–10.5)
nRBC: 0 % (ref 0.0–0.2)

## 2020-05-12 LAB — RAPID URINE DRUG SCREEN, HOSP PERFORMED
Amphetamines: POSITIVE — AB
Barbiturates: NOT DETECTED
Benzodiazepines: NOT DETECTED
Cocaine: NOT DETECTED
Opiates: POSITIVE — AB
Tetrahydrocannabinol: POSITIVE — AB

## 2020-05-12 LAB — COMPREHENSIVE METABOLIC PANEL
ALT: 13 U/L (ref 0–44)
AST: 15 U/L (ref 15–41)
Albumin: 4 g/dL (ref 3.5–5.0)
Alkaline Phosphatase: 38 U/L (ref 38–126)
Anion gap: 12 (ref 5–15)
BUN: 16 mg/dL (ref 6–20)
CO2: 23 mmol/L (ref 22–32)
Calcium: 8.9 mg/dL (ref 8.9–10.3)
Chloride: 100 mmol/L (ref 98–111)
Creatinine, Ser: 0.85 mg/dL (ref 0.44–1.00)
GFR, Estimated: >60 mL/min (ref >60)
Glucose, Bld: 126 mg/dL — ABNORMAL HIGH (ref 70–99)
Potassium: 3.4 mmol/L — ABNORMAL LOW (ref 3.5–5.1)
Sodium: 135 mmol/L (ref 135–145)
Total Bilirubin: 1.4 mg/dL — ABNORMAL HIGH (ref 0.3–1.2)
Total Protein: 6.8 g/dL (ref 6.5–8.1)

## 2020-05-12 LAB — URINALYSIS, MICROSCOPIC (REFLEX)

## 2020-05-12 LAB — LIPASE, BLOOD: Lipase: 21 U/L (ref 11–51)

## 2020-05-12 MED ORDER — DIPHENHYDRAMINE HCL 50 MG/ML IJ SOLN
25.0000 mg | Freq: Once | INTRAMUSCULAR | Status: AC
  Administered 2020-05-12: 25 mg via INTRAVENOUS
  Filled 2020-05-12: qty 1

## 2020-05-12 MED ORDER — SODIUM CHLORIDE 0.9 % IV BOLUS
1000.0000 mL | Freq: Once | INTRAVENOUS | Status: AC
  Administered 2020-05-12: 1000 mL via INTRAVENOUS

## 2020-05-12 MED ORDER — DROPERIDOL 2.5 MG/ML IJ SOLN
2.5000 mg | Freq: Once | INTRAMUSCULAR | Status: AC
  Administered 2020-05-12: 2.5 mg via INTRAVENOUS
  Filled 2020-05-12: qty 2

## 2020-05-12 MED ORDER — LORAZEPAM 2 MG/ML IJ SOLN
1.0000 mg | Freq: Once | INTRAMUSCULAR | Status: AC
  Administered 2020-05-12: 1 mg via INTRAVENOUS
  Filled 2020-05-12: qty 1

## 2020-05-12 MED ORDER — KETOROLAC TROMETHAMINE 15 MG/ML IJ SOLN
15.0000 mg | Freq: Once | INTRAMUSCULAR | Status: AC
  Administered 2020-05-12: 15 mg via INTRAVENOUS
  Filled 2020-05-12: qty 1

## 2020-05-12 NOTE — ED Triage Notes (Signed)
The pt reports that she has ibs and she has had a flareup for the past 5 days  lmp last month

## 2020-05-12 NOTE — ED Provider Notes (Signed)
[facility]PARTMENT Provider Note   CSN: [id number] Arrival date & time: 05/12/20  1435     History Chief Complaint  Patient presents with  . Abdominal Pain    Marissa MARNEE SHERRARD is a 35 y.o. female.  35 y.o female with a PMH of IBS, Cannabis abuse presents to the ED with a chief complaint of abdominal pain x 5 days. Patient was evaluated this morning in the ED and treated for her symptoms. She reports returning to ED as she spoke to her gastroenterologist who stated "they cannot let you leave in pain as its not good for your IBS". Patient reports the pain has been intermittent, accompanied by nausea and vomiting.  However, vomiting and nausea have subsided today.  She states "I am having another of my IBS episodes today ".  She is followed by [facility], last saw Dr. Wilburn Mylar in September 2021.  She also endorses a subjective fever, however has not measured her temperature and states she is feeling hot.  Denies any chance of pregnancy, last menstrual period last month.  No vaginal bleeding, vaginal discharge, urinary symptoms, chest pain or shortness of breath.     The history is provided by the patient.  Abdominal Pain Pain location:  Generalized Pain quality: sharp   Pain radiates to:  LLQ and RUQ Onset quality:  Sudden Duration:  5 days Timing:  Intermittent Progression:  Worsening Chronicity:  New Context: not alcohol use, not diet changes, not eating and not laxative use   Associated symptoms: constipation   Associated symptoms: no chest pain, no diarrhea, no fever, no nausea, no shortness of breath, no sore throat and no vomiting   Risk factors: no alcohol abuse and not pregnant        Past Medical History:  Diagnosis Date  . Acid reflux disease   . Anxiety   . Depression   . Gastritis   . GERD (gastroesophageal reflux disease)   . IBS (irritable bowel syndrome)   . Pancreatitis     Patient Active Problem List   Diagnosis Date Noted   . Pneumomediastinum ([place]) 08/17/2018  . Retroperitoneal air   . Abdominal pain   . Intra-abdominal free air of unknown etiology 08/13/2018    Past Surgical History:  Procedure Laterality Date  . NO PAST SURGERIES    . WISDOM TOOTH EXTRACTION       OB History    Gravida  0   Para      Term      Preterm      AB      Living        SAB      IAB      Ectopic      Multiple      Live Births              Family History  Problem Relation Age of Onset  . Breast cancer Mother   . Irritable bowel syndrome Maternal Grandmother   . Crohn's disease Paternal Uncle   . Colon cancer Neg Hx   . Esophageal cancer Neg Hx   . Rectal cancer Neg Hx   . Stomach cancer Neg Hx     Social History   Tobacco Use  . Smoking status: Former Smoker    Packs/day: 0.10    Years: 2.00    Pack years: 0.20    Types: Cigarettes    Quit date: 07/2018    Years since quitting: 1.8  .  Smokeless tobacco: Never Used  . Tobacco comment: Not cuuently  Vaping Use  . Vaping Use: Former  Substance Use Topics  . Alcohol use: Yes    Comment: 01/18/2019 was the last day she dranked   . Drug use: Yes    Types: Marijuana    Comment: last smoked marijuana 01-20-2019    Home Medications Prior to Admission medications   Medication Sig Start Date End Date Taking? Authorizing Provider  dicyclomine (BENTYL) 10 MG capsule TAKE 1 CAPSULE (10 MG TOTAL) BY MOUTH EVERY 6 (SIX) HOURS AS NEEDED FOR SPASMS. 03/08/20  Yes Cirigliano, Vito V, DO  ondansetron (ZOFRAN-ODT) 4 MG disintegrating tablet Take 1 tablet (4 mg total) by mouth every 6 (six) hours as needed for nausea. 05/04/20  Yes Cirigliano, Vito V, DO  acetaminophen (TYLENOL) 500 MG tablet Take 500 mg by mouth every 6 (six) hours as needed for mild pain.    [provider]    Allergies    Capsaicin  Review of Systems   Review of Systems  Constitutional: Negative for fever.  HENT: Negative for sore throat.   Respiratory: Negative  for shortness of breath.   Cardiovascular: Negative for chest pain.  Gastrointestinal: Positive for abdominal pain and constipation. Negative for blood in stool, diarrhea, nausea and vomiting.  Genitourinary: Negative for flank pain.  Neurological: Negative for light-headedness and headaches.  All other systems reviewed and are negative.   Physical Exam Updated Vital Signs BP (!) 129/96   Pulse 94   Temp 98 F (36.7 C) (Oral)   Resp 20   Ht 5\' 4"  (1.626 m)   Wt 63.5 kg   LMP 04/15/2020   SpO2 100%   BMI 24.01 kg/m   Physical Exam Vitals and nursing note reviewed.  Constitutional:      General: She is not in acute distress.    Appearance: She is well-developed. She is not ill-appearing.     Comments: In no distress watching a movie on her phone walking around the room.   HENT:     Head: Normocephalic and atraumatic.  Cardiovascular:     Rate and Rhythm: Normal rate.  Pulmonary:     Effort: Pulmonary effort is normal.     Breath sounds: No wheezing.  Abdominal:     General: Abdomen is flat. Bowel sounds are decreased.     Palpations: Abdomen is soft.     Tenderness: There is generalized abdominal tenderness. There is no right CVA tenderness or left CVA tenderness.     Hernia: No hernia is present.  Skin:    General: Skin is warm and dry.  Neurological:     Mental Status: She is alert.     ED Results / Procedures / Treatments   Labs (all labs ordered are listed, but only abnormal results are displayed) Labs Reviewed - No data to display  EKG None  Radiology No results found.  Procedures Procedures   Medications Ordered in ED Medications  sodium chloride 0.9 % bolus 1,000 mL (1,000 mLs Intravenous New Bag/Given 05/12/20 1957)  LORazepam (ATIVAN) injection 1 mg (1 mg Intravenous Given 05/12/20 2000)  LORazepam (ATIVAN) injection 1 mg (1 mg Intravenous Given 05/12/20 2205)    ED Course  I have reviewed the triage vital signs and the nursing notes.  Pertinent  labs & imaging results that were available during my care of the patient were reviewed by me and considered in my medical decision making (see chart for details).  MDM Rules/Calculators/A&P     This is a recurrent patient to the ED, presents today after being discharged this morning for abdominal pain.  She reports her symptoms have not improved, she was told by her GI physician that she will need to be reevaluated in ED.  1 question when patient last saw gastroenterologist, she reports this was September 2021.  I have extensively review her chart, with previous visits including self-induced vomiting, requesting narcotics, cannabis abuse.  During evaluation today, patient is found walking around the room, watching a movie on the screen.  Once I arrived to the room she states "I need you to give me something strong".  We discussed that we will not be repeating labs at this time as labs this morning were within normal limits, no leukocytosis, LFTs are normal, lipase level is within normal limits.  CBC without any leukocytosis.  Patient was discharged from the hospital this morning.  Abdomen appears soft, she does voice pain, stating this is likely her IBS.  UDS this morning was positive for opioids, amphetamines, THC.  She does have a prior history of cannabis abuse.  Suspect this is likely related to her THC use versus IBS.  This is a vomiting on today's visit, vitals have remained stable.  I provided her with a bolus, Ativan to help with symptoms.  She reports improvement after 1 round of Ativan.  When I entered the room, patient appears in no distress, vitals are stable, no active episodes of vomiting.  She states " I need to give me something stronger for pain ".  We discussed that I will be unable to treat her IBS pain today with narcotics as this could likely worsen her condition.  She is requesting a second round of Ativan as this helps with her symptoms.  Discussed with her that she will  eventually need to follow-up with her gastroenterologist, she reports she does have an scheduled appointment on the 10th, I do not see this on her records after reviewed of her chart.  Patient understands and agrees with management, return precautions have been provided at length.  Portions of this note were generated with [employer]. Dictation errors may occur despite best attempts at proofreading.  Final Clinical Impression(s) / ED Diagnoses Final diagnoses:  Chronic abdominal pain    Rx / DC Orders ED Discharge Orders    None       Janeece Fitting, PA-C 05/12/20 2251    Lorelle Gibbs, DO 05/13/20 0022

## 2020-05-12 NOTE — ED Provider Notes (Addendum)
[facility] Provider Note: Georgena Spurling, MD, FACEP  CSN: [id number] MRN: [id number] ARRIVAL: 05/12/20 at 0457 ROOM: [facility]  Abdominal Pain   HISTORY OF PRESENT ILLNESS  05/12/20 5:09 AM Samantha Noble is a 35 y.o. female with a history of IBS.  She is having abdominal pain which began 4 days ago.  She rates her pain is a 10 out of 10, cramping and sharp in nature and located in her left abdomen.  She is also having associated constipation.  She was seen in the ED for this on 05/09/2020 and had a work-up that included a CT of the abdomen and pelvis.  This showed a corpus luteum cyst on the right adnexa with the trace of likely physiologic fluid in the pelvis but otherwise unremarkable.  She was given multiple medications including morphine, Zofran fentanyl, Bentyl and droperidol.  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  She was seen again in the ED on 05/10/2020 and again was given multiple medications including fentanyl, Compazine, Toradol, Reglan, hyoscyamine and droperidol.  Drug screen was positive for THC; it was also positive for amphetamines and opioids but she takes Adderall and was given fentanyl in the ED the day before.  Dr. Wilma Flavin ED note indicates the patient almost immediately requested narcotic pain medication when he saw her.  She was also noted to be attempting to induce vomiting by sticking her fingers down her throat on multiple occasions and she was confronted regarding that behavior.  His note indicates droperidol appeared to be beneficial on both visits.   She checked into the [facility] ED within the last hour but left and came over here without being seen.  She had been vomiting earlier in the week but states she has not been vomiting today.  She states the only medication that helped her on her previous two visits was the fentanyl.  The pain in her left abdomen is worse with movement or  palpation.  She prefers to stand and not lie down.  She admits to smoking "only 1 joint" per day.  She does not seem to be familiar with cannabis associated hyperemesis syndrome but it is noted that she is allergic to capsicin.    Past Medical History:  Diagnosis Date  . Acid reflux disease   . Anxiety   . Depression   . Gastritis   . GERD (gastroesophageal reflux disease)   . IBS (irritable bowel syndrome)   . Pancreatitis     Past Surgical History:  Procedure Laterality Date  . NO PAST SURGERIES    . WISDOM TOOTH EXTRACTION      Family History  Problem Relation Age of Onset  . Breast cancer Mother   . Irritable bowel syndrome Maternal Grandmother   . Crohn's disease Paternal Uncle   . Colon cancer Neg Hx   . Esophageal cancer Neg Hx   . Rectal cancer Neg Hx   . Stomach cancer Neg Hx     Social History   Tobacco Use  . Smoking status: Former Smoker    Packs/day: 0.10    Years: 2.00    Pack years: 0.20    Types: Cigarettes    Quit date: 07/2018    Years since quitting: 1.8  . Smokeless tobacco: Never Used  . Tobacco comment: Not cuuently  Vaping Use  . Vaping Use: Former  Substance Use Topics  . Alcohol use: Yes  Comment: 01/18/2019 was the last day she dranked   . Drug use: Yes    Types: Marijuana    Comment: last smoked marijuana 01-20-2019    Prior to Admission medications   Medication Sig Start Date End Date Taking? Authorizing Provider  acetaminophen (TYLENOL) 500 MG tablet Take 500 mg by mouth every 6 (six) hours as needed for mild pain.    [provider]  dicyclomine (BENTYL) 10 MG capsule TAKE 1 CAPSULE (10 MG TOTAL) BY MOUTH EVERY 6 (SIX) HOURS AS NEEDED FOR SPASMS. 03/08/20   Cirigliano, Vito V, DO  ondansetron (ZOFRAN-ODT) 4 MG disintegrating tablet Take 1 tablet (4 mg total) by mouth every 6 (six) hours as needed for nausea. 05/04/20   Cirigliano, Vito V, DO    Allergies Capsaicin   REVIEW OF SYSTEMS  Negative except as noted  here or in the History of Present Illness.   PHYSICAL EXAMINATION  Initial Vital Signs Blood pressure (!) 142/105, pulse 73, temperature 97.6 F (36.4 C), temperature source Oral, resp. rate 16, height 5\' 4"  (1.626 m), weight 61.2 kg, last menstrual period 04/15/2020, SpO2 98 %.  Examination General: Well-developed, well-nourished female in no acute distress; appearance consistent with age of record HENT: normocephalic; atraumatic Eyes: pupils equal, round and reactive to light; extraocular muscles intact Neck: supple Heart: regular rate and rhythm Lungs: clear to auscultation bilaterally Abdomen: soft; nondistended; left lower quadrant tenderness; no masses or hepatosplenomegaly; bowel sounds present Extremities: No deformity; full range of motion Neurologic: Awake, alert and oriented; motor function intact in all extremities and symmetric; no facial droop Skin: Warm and dry Psychiatric: Normal mood and affect   RESULTS  Summary of this visit's results, reviewed and interpreted by myself:   EKG Interpretation  Date/Time:    Ventricular Rate:    PR Interval:    QRS Duration:   QT Interval:    QTC Calculation:   R Axis:     Text Interpretation:        Laboratory Studies: Results for orders placed or performed during the hospital encounter of 05/12/20 (from the past 24 hour(s))  Urinalysis, Routine w reflex microscopic     Status: Abnormal   Collection Time: 05/12/20  5:22 AM  Result Value Ref Range   Color, Urine YELLOW YELLOW   APPearance CLEAR CLEAR   Specific Gravity, Urine 1.030 1.005 - 1.030   pH 6.0 5.0 - 8.0   Glucose, UA NEGATIVE NEGATIVE mg/dL   Hgb urine dipstick NEGATIVE NEGATIVE   Bilirubin Urine NEGATIVE NEGATIVE   Ketones, ur 80 (A) NEGATIVE mg/dL   Protein, ur 100 (A) NEGATIVE mg/dL   Nitrite NEGATIVE NEGATIVE   Leukocytes,Ua NEGATIVE NEGATIVE  Rapid urine drug screen (hospital performed)     Status: Abnormal   Collection Time: 05/12/20  5:22 AM   Result Value Ref Range   Opiates POSITIVE (A) NONE DETECTED   Cocaine NONE DETECTED NONE DETECTED   Benzodiazepines NONE DETECTED NONE DETECTED   Amphetamines POSITIVE (A) NONE DETECTED   Tetrahydrocannabinol POSITIVE (A) NONE DETECTED   Barbiturates NONE DETECTED NONE DETECTED  Urinalysis, Microscopic (reflex)     Status: Abnormal   Collection Time: 05/12/20  5:22 AM  Result Value Ref Range   RBC / HPF 0-5 0 - 5 RBC/hpf   WBC, UA 0-5 0 - 5 WBC/hpf   Bacteria, UA RARE (A) NONE SEEN   Squamous Epithelial / LPF 0-5 0 - 5   Mucus PRESENT    Imaging Studies: No results  found.  ED COURSE and MDM  Nursing notes, initial and subsequent vitals signs, including pulse oximetry, reviewed and interpreted by myself.  Vitals:   05/12/20 0505 05/12/20 0507 05/12/20 0610  BP:  (!) 142/105 (!) 139/98  Pulse:  73 75  Resp:  16 16  Temp:  97.6 F (36.4 C)   TempSrc:  Oral   SpO2:  98% 99%  Weight: 61.2 kg    Height: 5\' 4"  (1.626 m)     Medications  sodium chloride 0.9 % bolus 1,000 mL (1,000 mLs Intravenous New Bag/Given 05/12/20 0540)  ketorolac (TORADOL) 15 MG/ML injection 15 mg (15 mg Intravenous Given 05/12/20 0541)  droperidol (INAPSINE) 2.5 MG/ML injection 2.5 mg (2.5 mg Intravenous Given 05/12/20 0540)  diphenhydrAMINE (BENADRYL) injection 25 mg (25 mg Intravenous Given 05/12/20 0600)   6:40 AM Patient sleeping comfortably after IV medications.  She has had no vomiting while in the ED and she acknowledges she has not had vomiting at all today.  She was advised she would not be receiving any narcotic medications.  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  She states this is her irritable bowel syndrome, which she has had since her 40s, but her symptoms are not typical for irritable bowel syndrome.  IBS is not normally associated with severe nausea and vomiting.   PROCEDURES  Procedures   ED DIAGNOSES     ICD-10-CM   1. Left lower quadrant abdominal pain   R10.32   2. Cannabis abuse  F12.10        Brityn Mastrogiovanni, Jenny Reichmann, MD 05/12/20 0086    Shanon Rosser, MD 05/12/20 [phone]

## 2020-05-12 NOTE — ED Triage Notes (Signed)
Pt states she is having a flare of her IBS  Pt states she is having abd pain with some pain in her back  Pt states pain started on Sunday  Pt is c/o nausea and states she is constipated  States last BM was on Sunday

## 2020-05-12 NOTE — Discharge Instructions (Addendum)
Please continue following up with your gastroenterology Dr. Bryan Lemma.  If you experience any fever, worsening symptoms you may return to the emergency department.

## 2020-05-12 NOTE — ED Triage Notes (Signed)
Abdominal pain. States she is having a IBS flare up. She was seen here at 5am today.

## 2020-05-12 NOTE — ED Notes (Signed)
Pt did not want to wait and left. 

## 2020-05-12 NOTE — ED Notes (Signed)
ED Provider at bedside. 

## 2020-05-13 ENCOUNTER — Other Ambulatory Visit: Payer: Self-pay

## 2020-05-13 ENCOUNTER — Encounter (HOSPITAL_BASED_OUTPATIENT_CLINIC_OR_DEPARTMENT_OTHER): Payer: Self-pay | Admitting: Emergency Medicine

## 2020-05-13 ENCOUNTER — Emergency Department (HOSPITAL_BASED_OUTPATIENT_CLINIC_OR_DEPARTMENT_OTHER)
Admission: EM | Admit: 2020-05-13 | Discharge: 2020-05-13 | Disposition: A | Discharge status: Home / Self Care | Payer: BLUE CROSS/BLUE SHIELD | Attending: Emergency Medicine | Admitting: Emergency Medicine

## 2020-05-13 DIAGNOSIS — Z87891 Personal history of nicotine dependence: Secondary | ICD-10-CM | POA: Diagnosis not present

## 2020-05-13 DIAGNOSIS — R112 Nausea with vomiting, unspecified: Secondary | ICD-10-CM | POA: Insufficient documentation

## 2020-05-13 DIAGNOSIS — L299 Pruritus, unspecified: Secondary | ICD-10-CM | POA: Diagnosis not present

## 2020-05-13 DIAGNOSIS — M791 Myalgia, unspecified site: Secondary | ICD-10-CM | POA: Insufficient documentation

## 2020-05-13 MED ORDER — METHOCARBAMOL 500 MG PO TABS
1000.0000 mg | ORAL_TABLET | Freq: Once | ORAL | Status: AC
  Administered 2020-05-13: 1000 mg via ORAL
  Filled 2020-05-13: qty 2

## 2020-05-13 MED ORDER — KETOROLAC TROMETHAMINE 30 MG/ML IJ SOLN
30.0000 mg | Freq: Once | INTRAMUSCULAR | Status: AC
  Administered 2020-05-13: 30 mg via INTRAMUSCULAR
  Filled 2020-05-13: qty 1

## 2020-05-13 NOTE — Discharge Instructions (Signed)
Please read and follow all provided instructions.  Your diagnoses today include:  1. Muscle pain     Tests performed today include:  Vital signs. See below for your results today.   Medications prescribed:   None  Take any prescribed medications only as directed.  Home care instructions:  Follow any educational materials contained in this packet.  BE VERY CAREFUL not to take multiple medicines containing Tylenol (also called acetaminophen). Doing so can lead to an overdose which can damage your liver and cause liver failure and possibly death.   Follow-up instructions: Please follow-up with your primary care provider in the next 3 days for further evaluation of your symptoms.   Return instructions:   Please return to the Emergency Department if you experience worsening symptoms.   Please return if you have any other emergent concerns.  Additional Information:  Your vital signs today were: BP (!) 158/100 (BP Location: Left Arm)   Pulse 80   Temp 98.8 F (37.1 C) (Oral)   Resp 20   Ht 5\' 4"  (1.626 m)   Wt 63.5 kg   LMP 04/15/2020   SpO2 100%   BMI 24.03 kg/m  If your blood pressure (BP) was elevated above 135/85 this visit, please have this repeated by your doctor within one month. --------------

## 2020-05-13 NOTE — ED Triage Notes (Signed)
Patient presents with multiple complaints; complains of upper back pain and bilateral upper extremity pain; states onset today. Denies fever or chills. Seen here yesterday for IBS complaints; states those concerns are resolved now.

## 2020-05-13 NOTE — ED Notes (Signed)
Pt. C/o body aches and reports she has talked with her Dr. And he told her to come to the ED

## 2020-05-13 NOTE — ED Provider Notes (Signed)
[facility] Provider Note   CSN: [id number] Arrival date & time: 05/13/20  1952     History Chief Complaint  Patient presents with  . Back Pain    Samantha Noble is a 35 y.o. female.  Patient presents for evaluation of musculoskeletal pain.  Patient with several recent visits for nausea and vomiting, IBS/cyclic vomiting symptoms.  She states that those symptoms are improved however over the past day she has developed pain in her left upper extremity between her elbow and shoulder, in her left neck and posterior shoulder, and her bilateral legs with itching in the right foot.  She has taken multiple over-the-counter medications without improvement.  She denies acute injuries to these areas.  She is able to move everything around.  No distal numbness or tingling.  She is able to walk well. The onset of this condition was acute. The course is constant. Aggravating factors: movement and palpation. Alleviating factors: none.  She volunteers "on a scale of 0-10, the pain is 1000".         Past Medical History:  Diagnosis Date  . Acid reflux disease   . Anxiety   . Depression   . Gastritis   . GERD (gastroesophageal reflux disease)   . IBS (irritable bowel syndrome)   . Pancreatitis     Patient Active Problem List   Diagnosis Date Noted  . Pneumomediastinum ([place]) 08/17/2018  . Retroperitoneal air   . Abdominal pain   . Intra-abdominal free air of unknown etiology 08/13/2018    Past Surgical History:  Procedure Laterality Date  . NO PAST SURGERIES    . WISDOM TOOTH EXTRACTION       OB History    Gravida  0   Para      Term      Preterm      AB      Living        SAB      IAB      Ectopic      Multiple      Live Births              Family History  Problem Relation Age of Onset  . Breast cancer Mother   . Irritable bowel syndrome Maternal Grandmother   . Crohn's disease Paternal Uncle   . Colon cancer Neg Hx   .  Esophageal cancer Neg Hx   . Rectal cancer Neg Hx   . Stomach cancer Neg Hx     Social History   Tobacco Use  . Smoking status: Former Smoker    Packs/day: 0.10    Years: 2.00    Pack years: 0.20    Types: Cigarettes    Quit date: 07/2018    Years since quitting: 1.8  . Smokeless tobacco: Never Used  . Tobacco comment: Not cuuently  Vaping Use  . Vaping Use: Former  Substance Use Topics  . Alcohol use: Yes    Comment: 01/18/2019 was the last day she dranked   . Drug use: Yes    Types: Marijuana    Comment: last smoked marijuana 01-20-2019    Home Medications Prior to Admission medications   Medication Sig Start Date End Date Taking? Authorizing Provider  acetaminophen (TYLENOL) 500 MG tablet Take 500 mg by mouth every 6 (six) hours as needed for mild pain.    [provider]  dicyclomine (BENTYL) 10 MG capsule TAKE 1 CAPSULE (10 MG TOTAL) BY MOUTH EVERY  6 (SIX) HOURS AS NEEDED FOR SPASMS. 03/08/20   Cirigliano, Vito V, DO  ondansetron (ZOFRAN-ODT) 4 MG disintegrating tablet Take 1 tablet (4 mg total) by mouth every 6 (six) hours as needed for nausea. 05/04/20   Cirigliano, Vito V, DO    Allergies    Capsaicin  Review of Systems   Review of Systems  Constitutional: Negative for fever.  HENT: Negative for rhinorrhea and sore throat.   Eyes: Negative for redness.  Respiratory: Negative for cough.   Cardiovascular: Negative for chest pain.  Gastrointestinal: Negative for abdominal pain, diarrhea, nausea and vomiting.  Genitourinary: Negative for dysuria, frequency, hematuria and urgency.  Musculoskeletal: Positive for myalgias. Negative for arthralgias.  Skin: Negative for rash.  Neurological: Negative for headaches.    Physical Exam Updated Vital Signs BP (!) 158/100 (BP Location: Left Arm)   Pulse 80   Temp 98.8 F (37.1 C) (Oral)   Resp 20   Ht 5\' 4"  (1.626 m)   Wt 63.5 kg   LMP 04/15/2020   SpO2 100%   BMI 24.03 kg/m   Physical Exam Vitals  and nursing note reviewed.  Constitutional:      General: She is not in acute distress.    Appearance: She is well-developed.  HENT:     Head: Normocephalic and atraumatic.     Right Ear: External ear normal.     Left Ear: External ear normal.     Nose: Nose normal.  Eyes:     Conjunctiva/sclera: Conjunctivae normal.  Cardiovascular:     Rate and Rhythm: Normal rate and regular rhythm.     Heart sounds: No murmur heard.   Pulmonary:     Effort: No respiratory distress.     Breath sounds: No wheezing, rhonchi or rales.  Abdominal:     Palpations: Abdomen is soft.     Tenderness: There is no abdominal tenderness. There is no guarding or rebound.  Musculoskeletal:     Cervical back: Normal range of motion and neck supple.     Right lower leg: No edema.     Left lower leg: No edema.     Comments: Patient with full range of motion of her cervical spine, left shoulder, left elbow, and left wrist.  She reports tenderness to palpation in the left upper arm as well as the posterior shoulder area.  Pulses in the distal upper extremities 2+ with normal capillary refill.  Distal sensation intact.  I looked at the patient's right foot where she reports itching.  No erythema or redness.  Exam is essentially normal.  Skin:    General: Skin is warm and dry.     Findings: No rash.  Neurological:     General: No focal deficit present.     Mental Status: She is alert. Mental status is at baseline.     Motor: No weakness.  Psychiatric:        Mood and Affect: Mood normal.     ED Results / Procedures / Treatments   Labs (all labs ordered are listed, but only abnormal results are displayed) Labs Reviewed - No data to display  EKG None  Radiology No results found.  Procedures Procedures   Medications Ordered in ED Medications  ketorolac (TORADOL) 30 MG/ML injection 30 mg (has no administration in time range)  methocarbamol (ROBAXIN) tablet 1,000 mg (has no administration in time  range)    ED Course  I have reviewed the triage vital signs and the nursing notes.  Pertinent labs & imaging results that were available during my care of the patient were reviewed by me and considered in my medical decision making (see chart for details).  Patient seen and examined.  Pain seems to be musculoskeletal in nature.  Her GI symptoms have appeared to be resolved.  Offered a dose of Robaxin and IM Toradol.  Patient agrees.  She will be discharged home with conservative management including OTC meds, use of ice and heat at home.  Encourage PCP follow-up next week.  Vital signs reviewed and are as follows: BP (!) 158/100 (BP Location: Left Arm)   Pulse 80   Temp 98.8 F (37.1 C) (Oral)   Resp 20   Ht 5\' 4"  (1.626 m)   Wt 63.5 kg   LMP 04/15/2020   SpO2 100%   BMI 24.03 kg/m       MDM Rules/Calculators/A&P                          Patient with reproducible musculoskeletal pain in her left arm and shoulder.  She has itching in her leg.  She is got full range of motion.  CMS intact distally.  She looks well and in no distress.  Do not feel x-rays or lab work-up indicated at this time.   Final Clinical Impression(s) / ED Diagnoses Final diagnoses:  Muscle pain    Rx / DC Orders ED Discharge Orders    None       Suann Larry 05/13/20 2204    Truddie Hidden, MD 05/13/20 2259

## 2020-05-18 ENCOUNTER — Encounter (HOSPITAL_BASED_OUTPATIENT_CLINIC_OR_DEPARTMENT_OTHER): Payer: Self-pay | Admitting: Emergency Medicine

## 2020-05-18 ENCOUNTER — Emergency Department (HOSPITAL_BASED_OUTPATIENT_CLINIC_OR_DEPARTMENT_OTHER): Payer: BLUE CROSS/BLUE SHIELD

## 2020-05-18 ENCOUNTER — Other Ambulatory Visit: Payer: Self-pay

## 2020-05-18 ENCOUNTER — Emergency Department (HOSPITAL_BASED_OUTPATIENT_CLINIC_OR_DEPARTMENT_OTHER)
Admission: EM | Admit: 2020-05-18 | Discharge: 2020-05-18 | Disposition: A | Discharge status: Home / Self Care | Payer: BLUE CROSS/BLUE SHIELD | Attending: Emergency Medicine | Admitting: Emergency Medicine

## 2020-05-18 DIAGNOSIS — R1110 Vomiting, unspecified: Secondary | ICD-10-CM | POA: Insufficient documentation

## 2020-05-18 DIAGNOSIS — K219 Gastro-esophageal reflux disease without esophagitis: Secondary | ICD-10-CM | POA: Diagnosis not present

## 2020-05-18 DIAGNOSIS — R1084 Generalized abdominal pain: Secondary | ICD-10-CM | POA: Insufficient documentation

## 2020-05-18 DIAGNOSIS — K5900 Constipation, unspecified: Secondary | ICD-10-CM | POA: Diagnosis not present

## 2020-05-18 DIAGNOSIS — G8929 Other chronic pain: Secondary | ICD-10-CM | POA: Diagnosis not present

## 2020-05-18 DIAGNOSIS — R109 Unspecified abdominal pain: Secondary | ICD-10-CM

## 2020-05-18 DIAGNOSIS — Z87891 Personal history of nicotine dependence: Secondary | ICD-10-CM | POA: Diagnosis not present

## 2020-05-18 HISTORY — DX: Cannabis abuse, uncomplicated: F12.10

## 2020-05-18 LAB — RAPID URINE DRUG SCREEN, HOSP PERFORMED
Amphetamines: POSITIVE — AB
Barbiturates: NOT DETECTED
Benzodiazepines: NOT DETECTED
Cocaine: NOT DETECTED
Opiates: NOT DETECTED
Tetrahydrocannabinol: POSITIVE — AB

## 2020-05-18 LAB — URINALYSIS, ROUTINE W REFLEX MICROSCOPIC
Bilirubin Urine: NEGATIVE
Glucose, UA: NEGATIVE mg/dL
Hgb urine dipstick: NEGATIVE
Ketones, ur: NEGATIVE mg/dL
Nitrite: NEGATIVE
Protein, ur: NEGATIVE mg/dL
Specific Gravity, Urine: 1.02 (ref 1.005–1.030)
pH: 6.5 (ref 5.0–8.0)

## 2020-05-18 LAB — URINALYSIS, MICROSCOPIC (REFLEX)

## 2020-05-18 LAB — PREGNANCY, URINE: Preg Test, Ur: NEGATIVE

## 2020-05-18 MED ORDER — METOCLOPRAMIDE HCL 5 MG/ML IJ SOLN
10.0000 mg | INTRAMUSCULAR | Status: AC
  Administered 2020-05-18: 10 mg via INTRAVENOUS
  Filled 2020-05-18: qty 2

## 2020-05-18 MED ORDER — DROPERIDOL 2.5 MG/ML IJ SOLN
2.5000 mg | Freq: Once | INTRAMUSCULAR | Status: AC
  Administered 2020-05-18: 2.5 mg via INTRAVENOUS
  Filled 2020-05-18: qty 2

## 2020-05-18 MED ORDER — KETOROLAC TROMETHAMINE 15 MG/ML IJ SOLN
30.0000 mg | Freq: Once | INTRAMUSCULAR | Status: AC
  Administered 2020-05-18: 30 mg via INTRAVENOUS
  Filled 2020-05-18: qty 2

## 2020-05-18 MED ORDER — POLYETHYLENE GLYCOL 3350 17 GM/SCOOP PO POWD: ORAL | 0 refills | Status: DC

## 2020-05-18 MED ORDER — HALOPERIDOL LACTATE 5 MG/ML IJ SOLN
5.0000 mg | Freq: Once | INTRAMUSCULAR | Status: AC
  Administered 2020-05-18: 5 mg via INTRAVENOUS
  Filled 2020-05-18: qty 1

## 2020-05-18 MED ORDER — ALUM & MAG HYDROXIDE-SIMETH 200-200-20 MG/5ML PO SUSP
30.0000 mL | Freq: Once | ORAL | Status: AC
  Administered 2020-05-18: 30 mL via ORAL
  Filled 2020-05-18: qty 30

## 2020-05-18 MED ORDER — FLEET ENEMA 7-19 GM/118ML RE ENEM: 1.0000 | ENEMA | Freq: Once | RECTAL | 0 refills | Status: AC

## 2020-05-18 NOTE — ED Notes (Signed)
Pt transported to XRAY at this time.

## 2020-05-18 NOTE — ED Notes (Signed)
She continues to decline v.s. Additional meds given at this time.

## 2020-05-18 NOTE — ED Triage Notes (Signed)
Pt c/o abd pain that started after pt states she has not have a BM in 8 days. Pt states that the pain is in her lower back and that is comes in waves. Pt states that she saw a regular doctor and they stated that they told her to take a laxative to help have a BM. Pt states that she is coming to the ER for pain and constipation until she is able to see her GI dr. Abbott Noble aaox3, ambulatory with steady gait, VSS, GCS 15.

## 2020-05-18 NOTE — ED Notes (Signed)
Pt stated she had an episode of emesis; approximately 200 ml of emesis. EDP made aware.

## 2020-05-18 NOTE — ED Provider Notes (Signed)
[facility] Provider Note   CSN: [id number] Arrival date & time: 05/18/20  0545     History Chief Complaint  Patient presents with  . Constipation  . Abdominal Pain    Samantha Noble is a 35 y.o. female.  35 year old female with past medical history below including IBS, anxiety/depression, pancreatitis, marijuana use who presents with constipation and abdominal pain.  Patient states that she has history of IBS and has been here several times over the past week for abdominal pain related to IBS.  After the last time, she states that she was improving but she has not had a bowel movement in the past 8 days and overnight her pain has become unbearable.  She called her PCP and got Colace which she took but she has not had improvement.  She takes Bentyl every 6 hours, has not taken any Zofran today.  She states that she has passed some gas in a few hard balls of stool but no actual bowel movement.  She reports severe, generalized abdominal pain.  She states that she vomited here in the ED.  She denies any urinary symptoms or vaginal bleeding/discharge.  This pain feels worse than her usual IBS. She has appt with GI tomorrow but states she couldn't wait due to severity of symptoms.  The history is provided by the patient.  Constipation Associated symptoms: abdominal pain   Abdominal Pain Associated symptoms: constipation        Past Medical History:  Diagnosis Date  . Acid reflux disease   . Anxiety   . Cannabis abuse   . Depression   . Gastritis   . GERD (gastroesophageal reflux disease)   . IBS (irritable bowel syndrome)   . Pancreatitis     Patient Active Problem List   Diagnosis Date Noted  . Pneumomediastinum ([place]) 08/17/2018  . Retroperitoneal air   . Abdominal pain   . Intra-abdominal free air of unknown etiology 08/13/2018    Past Surgical History:  Procedure Laterality Date  . NO PAST SURGERIES    . WISDOM TOOTH EXTRACTION        OB History    Gravida  0   Para      Term      Preterm      AB      Living        SAB      IAB      Ectopic      Multiple      Live Births              Family History  Problem Relation Age of Onset  . Breast cancer Mother   . Irritable bowel syndrome Maternal Grandmother   . Crohn's disease Paternal Uncle   . Colon cancer Neg Hx   . Esophageal cancer Neg Hx   . Rectal cancer Neg Hx   . Stomach cancer Neg Hx     Social History   Tobacco Use  . Smoking status: Former Smoker    Packs/day: 0.10    Years: 2.00    Pack years: 0.20    Types: Cigarettes    Quit date: 07/2018    Years since quitting: 1.8  . Smokeless tobacco: Never Used  . Tobacco comment: Not cuuently  Vaping Use  . Vaping Use: Former  Substance Use Topics  . Alcohol use: Yes    Comment: 01/18/2019 was the last day she dranked   . Drug use: Yes  Types: Marijuana    Comment: last smoked marijuana 01-20-2019    Home Medications Prior to Admission medications   Medication Sig Start Date End Date Taking? Authorizing Provider  acetaminophen (TYLENOL) 500 MG tablet Take 500 mg by mouth every 6 (six) hours as needed for mild pain.   Yes [provider]  ondansetron (ZOFRAN-ODT) 4 MG disintegrating tablet Take 1 tablet (4 mg total) by mouth every 6 (six) hours as needed for nausea. 05/04/20  Yes Cirigliano, Vito V, DO  polyethylene glycol powder (GLYCOLAX/MIRALAX) 17 GM/SCOOP powder Mix 1 capful in drink and take by mouth one to three times daily as needed Until daily soft stools  OTC 05/18/20  Yes Maanasa Aderhold, Wenda Overland, MD  sodium phosphate (FLEET) 7-19 GM/118ML ENEM Place 133 mLs (1 enema total) rectally once for 1 dose. 05/18/20 05/18/20 Yes Zorianna Taliaferro, Wenda Overland, MD  dicyclomine (BENTYL) 10 MG capsule TAKE 1 CAPSULE (10 MG TOTAL) BY MOUTH EVERY 6 (SIX) HOURS AS NEEDED FOR SPASMS. 03/08/20   Cirigliano, Vito V, DO    Allergies    Capsaicin  Review of Systems   Review of  Systems  Gastrointestinal: Positive for abdominal pain and constipation.   All other systems reviewed and are negative except that which was mentioned in HPI  Physical Exam Updated Vital Signs BP (!) 146/90 (BP Location: Right Arm)   Pulse 75   Temp 97.9 F (36.6 C) (Oral)   Resp 16   Ht 5\' 4"  (1.626 m)   Wt 63 kg   LMP 05/12/2020 Comment: negative pregnancy test  SpO2 100%   BMI 23.86 kg/m   Physical Exam Vitals and nursing note reviewed.  Constitutional:      General: She is not in acute distress.    Appearance: Normal appearance.     Comments: Standing bedside bed hunched over, distressed  HENT:     Head: Normocephalic and atraumatic.  Eyes:     Conjunctiva/sclera: Conjunctivae normal.  Cardiovascular:     Rate and Rhythm: Normal rate and regular rhythm.     Heart sounds: Normal heart sounds. No murmur heard.   Pulmonary:     Effort: Pulmonary effort is normal.     Breath sounds: Normal breath sounds.  Abdominal:     General: Abdomen is flat. Bowel sounds are normal. There is no distension.     Palpations: Abdomen is soft.     Tenderness: There is no abdominal tenderness.     Comments: Exam limited as patient standing up and not supine in bed, but no focal tenderness during palpation  Musculoskeletal:     Right lower leg: No edema.     Left lower leg: No edema.  Skin:    General: Skin is warm and dry.  Neurological:     Mental Status: She is alert and oriented to person, place, and time.     Comments: fluent  Psychiatric:        Mood and Affect: Mood is anxious.     Comments: Distressed, tearful     ED Results / Procedures / Treatments   Labs (all labs ordered are listed, but only abnormal results are displayed) Labs Reviewed  URINALYSIS, ROUTINE W REFLEX MICROSCOPIC - Abnormal; Notable for the following components:      Result Value   APPearance HAZY (*)    Leukocytes,Ua SMALL (*)    All other components within normal limits  RAPID URINE DRUG  SCREEN, HOSP PERFORMED - Abnormal; Notable for the following components:  Amphetamines POSITIVE (*)    Tetrahydrocannabinol POSITIVE (*)    All other components within normal limits  URINALYSIS, MICROSCOPIC (REFLEX) - Abnormal; Notable for the following components:   Bacteria, UA FEW (*)    All other components within normal limits  PREGNANCY, URINE    EKG None  Radiology DG Abdomen 1 View  Result Date: 05/18/2020 CLINICAL DATA:  35 year old female with abdominal and low back pain. No bowel movement x8 days. EXAM: ABDOMEN - 1 VIEW COMPARISON:  CT Abdomen and Pelvis 05/09/2020. FINDINGS: Supine view at 0633 hours. Non obstructed bowel gas pattern. Retained stool from the splenic flexure distally, similar to the CT scout view in January. Subtle scoliosis. No acute osseous abnormality identified. Normal abdominal and pelvic visceral contours. IMPRESSION: Nonobstructed bowel-gas pattern with moderate retained stool in the colon similar to the CT 05/09/2020. Electronically Signed   By: Genevie Ann M.D.   On: 05/18/2020 07:02    Procedures Procedures   Medications Ordered in ED Medications  ketorolac (TORADOL) 15 MG/ML injection 30 mg (30 mg Intravenous Given 05/18/20 0745)  droperidol (INAPSINE) 2.5 MG/ML injection 2.5 mg (2.5 mg Intravenous Given 05/18/20 0745)  metoCLOPramide (REGLAN) injection 10 mg (10 mg Intravenous Given 05/18/20 0807)  alum & mag hydroxide-simeth (MAALOX/MYLANTA) 200-200-20 MG/5ML suspension 30 mL (30 mLs Oral Given 05/18/20 0808)  haloperidol lactate (HALDOL) injection 5 mg (5 mg Intravenous Given 05/18/20 4782)    ED Course  I have reviewed the triage vital signs and the nursing notes.  Pertinent labs & imaging results that were available during my care of the patient were reviewed by me and considered in my medical decision making (see chart for details).    MDM Rules/Calculators/A&P                          Pt standing up and walking around room, distressed on exam.  She asked for "strong" medication as she states "I've gotten the runaround from the other doctors" when she was evaluated recently. I explained that I do not use narcotics for IBS or constipation-related pain but I would be happy to provide non-narcotic medications to help. I also discussed need for bowel clean-out with miralax and discussed how to do this. UPT negative, UA w/o dehydration or infection. KUB without obstruction, moderate stool but no severe constipation. I reviewed her multiple previous work ups including reassuring labs on 1/31, 2/1, and 2/3 and reassuring CT abd/pelvis on 1/31.   After receiving droperidol and toradol, pt still complaining of pain. Gave reglan, later haldol and GI cocktail. On repeat assessment, pt still pacing room but calmer. Does report she still has pain. I have recommended bowel cleanout with miralax at home and f/u with her GI as scheduled tomorrow. Provided w/ miralax and fleets enema, discussed supportive measures at home. She voiced understanding.  Final Clinical Impression(s) / ED Diagnoses Final diagnoses:  Constipation, unspecified constipation type  Chronic abdominal pain    Rx / DC Orders ED Discharge Orders         Ordered    sodium phosphate (FLEET) 7-19 GM/118ML ENEM   Once        05/18/20 0923    polyethylene glycol powder (GLYCOLAX/MIRALAX) 17 GM/SCOOP powder        05/18/20 0923           Matis Monnier, Wenda Overland, MD 05/18/20 [phone]

## 2020-05-18 NOTE — ED Notes (Signed)
This RN asked this Patient if they were able to provide another Urine Sample to process the UDS. Patient stated she gave all she can and requested pain medicine. EDP made aware of patients behavior and request for pain medicine.

## 2020-05-18 NOTE — ED Notes (Signed)
Patient returned from XRAY 

## 2020-05-18 NOTE — ED Notes (Signed)
Pt came to [employer] requesting a Urine Specimen Cup so that if she was able to provide a Urine Specimen at a later time, she would be able to do so. Upon returning to the room with this RN the patient stated she was "hurting really bad since 0230 and needs something for the pain". This RN made the Patient aware the EDP was made aware of her request for pain medicine.

## 2020-05-18 NOTE — ED Notes (Signed)
She declines additional b/p check at this time and instructs [place] not to place the cuff on her arm, therefore, we do not. She is ambulatory without issue.

## 2020-05-19 ENCOUNTER — Ambulatory Visit (INDEPENDENT_AMBULATORY_CARE_PROVIDER_SITE_OTHER): Payer: BLUE CROSS/BLUE SHIELD | Admitting: Gastroenterology

## 2020-05-19 ENCOUNTER — Encounter: Payer: Self-pay | Admitting: Gastroenterology

## 2020-05-19 VITALS — BP 124/82 | HR 101 | Ht 64.0 in | Wt 133.5 lb

## 2020-05-19 DIAGNOSIS — R110 Nausea: Secondary | ICD-10-CM | POA: Diagnosis not present

## 2020-05-19 DIAGNOSIS — K581 Irritable bowel syndrome with constipation: Secondary | ICD-10-CM

## 2020-05-19 DIAGNOSIS — R109 Unspecified abdominal pain: Secondary | ICD-10-CM | POA: Diagnosis not present

## 2020-05-19 DIAGNOSIS — K5900 Constipation, unspecified: Secondary | ICD-10-CM

## 2020-05-19 MED ORDER — DICYCLOMINE HCL 20 MG PO TABS: 20.0000 mg | ORAL_TABLET | Freq: Three times a day (TID) | ORAL | 0 refills | Status: DC | PRN

## 2020-05-19 NOTE — Patient Instructions (Signed)
If you are age 33 or older, your body mass index should be between 23-30. Your Body mass index is 22.92 kg/m. If this is out of the aforementioned range listed, please consider follow up with your Primary Care Provider.  If you are age 25 or younger, your body mass index should be between 19-25. Your Body mass index is 22.92 kg/m. If this is out of the aformentioned range listed, please consider follow up with your Primary Care Provider.  ' Dr Bryan Lemma  recommends that you complete a bowel purge (to clean out your bowels). Please do the following: Purchase a bottle of Miralax over the counter   Mix the entire bottle of Miralax in 32 ounces of water/juice/gatorade (you may choose which of these liquids to drink) over the next 2-3 hours. You should expect results within 1 to 6 hours after completing the bowel purge.  Take Miralax 1 capful mixed in 8 ounces of water at bed time for constipation thereafter.  Use Align probiotic for 4 weeks.  We have sent the following medications to your pharmacy for you to pick up at your convenience:  Bentyl 20mg  every 8 hours as needed.  Due to recent changes in healthcare laws, you may see the results of your imaging and laboratory studies on MyChart before your provider has had a chance to review them.  We understand that in some cases there may be results that are confusing or concerning to you. Not all laboratory results come back in the same time frame and the provider may be waiting for multiple results in order to interpret others.  Please give [place] 48 hours in order for your provider to thoroughly review all the results before contacting the office for clarification of your results.     Thank you for choosing me and [facility].  Vito Cirigliano, D.O.

## 2020-05-19 NOTE — Progress Notes (Signed)
P  Chief Complaint:    Constipation, abdominal pain  GI History: 35 y.o.femalewith a history of anxiety disorder, GERD, gastritis, IBS, history of pancreatitis, marijuana use.  History of admission in 08/2018 with retroperitoneal free air on CT and readmission later that month with pneumomediastinum (left AMA).  Please see note dated 09/30/2019 for full details of that work-up and nonoperative management.   Evaluation to date: -CT (08/13/2018): Free air in right retroperitoneum, with source suspected to be retroperitoneal portion of a sending colon, but no clear point perforation identified. Mild ascending/transverse colitis -UGI series (08/14/2018): Normal stomach, mildly prominent distal duodenum. No extravasation -CT (08/15/2018): Diminished pneumomediastinum, retroperitoneal air and chest/abdominal wall air. Etiology felt to be chest related. No intra-abdominal pathology -CT C/A/P (08/17/2018): Moderate pneumomediastinum extending into the neck and decreased right retroperitoneal/right lateral abdominal subcutaneous gas. Likely represents rupture of a central airway with extension into the neck and abdomen. No pneumothorax -Barium esophagram (08/18/2018): Normal. No leak. No HH or reflux  Endoscopic History: -EGD (01/21/2019, Dr. Bryan Lemma): Mild luminal deformity prepyloric antrum consistent with previously healed ulcer, mild gastritis, normal esophagus/duodenum -Colonoscopy (01/21/2019, Dr. Bryan Lemma): Single aphthous ulcer in TI (biopsies benign), internal hemorrhoids, otherwise normal colonoscopy.  Repeat age 23  Separately, she has aHx of IBS-C. Cramping previously well controlled with intermittent use of Bentyl.Otherwise, does well with dietary mods and avoiding trigger type foods. Sxs worse with anxiety. Can also have nausea with abd cramping. Sxs present for at least 5 years.  No known family history of CRC, GI malignancy, liver disease, pancreatic disease, or  IBD.  HPI:     Patient is a 35 y.o. female presenting to the [facility]astroenterology Clinic for follow-up.  Last seen by me on 09/30/2019 for continued abdominal bloating and abdominal distention.  At that time, reported constipation was much improved and Bentyl prn effective for abdominal cramping.  Still with intermittent nausea, and was still smoking marijuana regularly.  Was again counseled on complete cessation with consideration of repeat EGD if symptoms continue due to previous prepyloric antral luminal deformity.  Since last appointment with me, she states she has been doing well until acute onset recurrence of abdominal pain and constipation.  She had her Covid vaccine number 11/31/2022, developed left-sided, spasm type abdominal pain that evening, that prompted her to go to the ER.  She has since been seen in the ER 6 additional times, including last evening, all 4 left-sided abdominal pain constipation.  Was still smoking 3 blunts daily. -05/09/2020: WBC 14.9, otherwise normal CBC, lipase, CMP, hCG.  CT abdomen/pelvis unremarkable -05/10/2020: WBC 10.4, normal CBC, lipase, CMP.  UDS positive for opiates, amphetamines, THC -05/12/2020: Normal CBC, T bili 1.4, otherwise normal CMP, lipase.  UDS positive for opiates, vitamins, THC -05/18/2020: UDS positive for amphetamines and THC.  Abdominal x-ray with moderate stool  Was discharged from the ER last evening with MiraLAX and fleets enemas and recommended for bowel cleanout, but she didn't do this.    Trialed Colace x4 a week ago, but with increased pain and pellet-like stools.  Otherwise has not tried any medications for this.  Bentyl 10 mg was not as effective for her abdominal cramping, but "used a friend's Bentyl 20 mg and this helped ". Aside from the last 10 days, typically her abdominal pain improves in hot shower, but more recently that didn't work for her. Drinking plenty of water.  Has stopped smoking marijuana over the last 7 days or so.    She  is  otherwise without any symptoms today.   Review of systems:     No chest pain, no SOB, no fevers, no urinary sx   Past Medical History:  Diagnosis Date  . Acid reflux disease   . ADHD   . Anxiety   . Cannabis abuse   . Depression   . Gastritis   . GERD (gastroesophageal reflux disease)   . IBS (irritable bowel syndrome)   . Pancreatitis     Patient's surgical history, family medical history, social history, medications and allergies were all reviewed in Epic    Current Outpatient Medications  Medication Sig Dispense Refill  . acetaminophen (TYLENOL) 500 MG tablet Take 500 mg by mouth every 6 (six) hours as needed for mild pain.    . ADDERALL XR 10 MG 24 hr capsule Take 10 mg by mouth daily.    Marland Kitchen buPROPion (WELLBUTRIN) 75 MG tablet Take 75 mg by mouth 2 (two) times daily.    Marland Kitchen dicyclomine (BENTYL) 10 MG capsule TAKE 1 CAPSULE (10 MG TOTAL) BY MOUTH EVERY 6 (SIX) HOURS AS NEEDED FOR SPASMS. 180 capsule 1  . ondansetron (ZOFRAN-ODT) 4 MG disintegrating tablet Take 1 tablet (4 mg total) by mouth every 6 (six) hours as needed for nausea. 60 tablet 5  . polyethylene glycol powder (GLYCOLAX/MIRALAX) 17 GM/SCOOP powder Mix 1 capful in drink and take by mouth one to three times daily as needed Until daily soft stools  OTC 225 g 0  . traZODone (DESYREL) 100 MG tablet 1-2 tablets at bedtime.     No current facility-administered medications for this visit.    Physical Exam:     BP 124/82   Pulse (!) 101   Ht 5\' 4"  (1.626 m)   Wt 133 lb 8 oz (60.6 kg)   LMP 05/12/2020 Comment: negative pregnancy test  BMI 22.92 kg/m   GENERAL:  Pleasant female in NAD PSYCH: : Cooperative, normal affect EENT:  conjunctiva pink, mucous membranes moist, neck supple without masses CARDIAC:  RRR, no murmur heard, no peripheral edema PULM: Normal respiratory effort, lungs CTA bilaterally, no wheezing ABDOMEN:  Nondistended, soft, nontender. No obvious masses, no hepatomegaly,  normal bowel  sounds SKIN:  turgor, no lesions seen Musculoskeletal:  Normal muscle tone, normal strength NEURO: Alert and oriented x 3, no focal neurologic deficits   IMPRESSION and PLAN:    1) IBS-C 2) Left-sided abdominal pain/Abdominal cramping  -Exacerbation of underlying IBS-C.  Did discuss that her chronic, relapsing abdominal pain can certainly be related to her chronic cannabis use disorder, particularly with resolution with hot showers.  Again strongly recommended complete cessation - Miralax purge then 1 cap/day and titrate to effect - Bentyl 20 mg PO prn Q8H prn abdominal pain - Water 64 oz/day  3) Chronic marijuana use disorder 4) Nausea without emesis -As above, high suspicion for cannabinoid syndrome for both chronic abdominal pain along with prior history of chronic nausea -Applauded her on no smoking in the last 7 days, and strongly encouraged her to quit completely -Positive amphetamine on UDS could be from her Adderall.  Was also given fentanyl during one of her recent ER evaluations, potentially explaining the positive opioid as well          Samantha Noble ,DO, FACG 05/19/2020, 2:07 PM

## 2020-05-24 ENCOUNTER — Other Ambulatory Visit: Payer: Self-pay | Admitting: Gastroenterology

## 2020-05-24 DIAGNOSIS — K581 Irritable bowel syndrome with constipation: Secondary | ICD-10-CM

## 2020-05-24 DIAGNOSIS — R109 Unspecified abdominal pain: Secondary | ICD-10-CM

## 2020-05-24 DIAGNOSIS — R110 Nausea: Secondary | ICD-10-CM

## 2020-06-02 ENCOUNTER — Other Ambulatory Visit: Payer: Self-pay

## 2020-06-03 ENCOUNTER — Ambulatory Visit: Payer: BLUE CROSS/BLUE SHIELD | Admitting: Nurse Practitioner

## 2020-06-17 ENCOUNTER — Ambulatory Visit (INDEPENDENT_AMBULATORY_CARE_PROVIDER_SITE_OTHER): Payer: BLUE CROSS/BLUE SHIELD | Admitting: Nurse Practitioner

## 2020-06-17 ENCOUNTER — Encounter: Payer: Self-pay | Admitting: Nurse Practitioner

## 2020-06-17 ENCOUNTER — Other Ambulatory Visit: Payer: Self-pay

## 2020-06-17 ENCOUNTER — Other Ambulatory Visit (HOSPITAL_COMMUNITY)
Admission: RE | Admit: 2020-06-17 | Discharge: 2020-06-17 | Disposition: A | Discharge status: Home / Self Care | Payer: BLUE CROSS/BLUE SHIELD | Source: Ambulatory Visit | Attending: Nurse Practitioner | Admitting: Nurse Practitioner

## 2020-06-17 VITALS — BP 110/68 | HR 92 | Temp 97.7°F | Ht 64.0 in | Wt 135.0 lb

## 2020-06-17 DIAGNOSIS — Z113 Encounter for screening for infections with a predominantly sexual mode of transmission: Secondary | ICD-10-CM

## 2020-06-17 DIAGNOSIS — N760 Acute vaginitis: Secondary | ICD-10-CM | POA: Insufficient documentation

## 2020-06-17 DIAGNOSIS — Z803 Family history of malignant neoplasm of breast: Secondary | ICD-10-CM | POA: Insufficient documentation

## 2020-06-17 DIAGNOSIS — Z0001 Encounter for general adult medical examination with abnormal findings: Secondary | ICD-10-CM

## 2020-06-17 DIAGNOSIS — Z124 Encounter for screening for malignant neoplasm of cervix: Secondary | ICD-10-CM | POA: Insufficient documentation

## 2020-06-17 DIAGNOSIS — B9689 Other specified bacterial agents as the cause of diseases classified elsewhere: Secondary | ICD-10-CM | POA: Diagnosis not present

## 2020-06-17 DIAGNOSIS — Z136 Encounter for screening for cardiovascular disorders: Secondary | ICD-10-CM | POA: Diagnosis not present

## 2020-06-17 DIAGNOSIS — N912 Amenorrhea, unspecified: Secondary | ICD-10-CM | POA: Diagnosis not present

## 2020-06-17 DIAGNOSIS — Z13220 Encounter for screening for lipoid disorders: Secondary | ICD-10-CM

## 2020-06-17 LAB — CBC WITH DIFFERENTIAL/PLATELET
Basophils Absolute: 0 10*3/uL (ref 0.0–0.1)
Basophils Relative: 0.5 % (ref 0.0–3.0)
Eosinophils Absolute: 0.1 10*3/uL (ref 0.0–0.7)
Eosinophils Relative: 0.9 % (ref 0.0–5.0)
HCT: 37.2 % (ref 36.0–46.0)
Hemoglobin: 12.3 g/dL (ref 12.0–15.0)
Lymphocytes Relative: 32.5 % (ref 12.0–46.0)
Lymphs Abs: 1.9 10*3/uL (ref 0.7–4.0)
MCHC: 33.1 g/dL (ref 30.0–36.0)
MCV: 90.3 fl (ref 78.0–100.0)
Monocytes Absolute: 0.3 10*3/uL (ref 0.1–1.0)
Monocytes Relative: 4.4 % (ref 3.0–12.0)
Neutro Abs: 3.7 10*3/uL (ref 1.4–7.7)
Neutrophils Relative %: 61.7 % (ref 43.0–77.0)
Platelets: 184 10*3/uL (ref 150.0–400.0)
RBC: 4.12 Mil/uL (ref 3.87–5.11)
RDW: 13.8 % (ref 11.5–15.5)
WBC: 5.9 10*3/uL (ref 4.0–10.5)

## 2020-06-17 LAB — COMPREHENSIVE METABOLIC PANEL
ALT: 7 U/L (ref 0–35)
AST: 8 U/L (ref 0–37)
Albumin: 4.1 g/dL (ref 3.5–5.2)
Alkaline Phosphatase: 36 U/L — ABNORMAL LOW (ref 39–117)
BUN: 12 mg/dL (ref 6–23)
CO2: 28 mEq/L (ref 19–32)
Calcium: 9.2 mg/dL (ref 8.4–10.5)
Chloride: 104 mEq/L (ref 96–112)
Creatinine, Ser: 0.89 mg/dL (ref 0.40–1.20)
GFR: 85.57 mL/min (ref >60.00)
Glucose, Bld: 101 mg/dL — ABNORMAL HIGH (ref 70–99)
Potassium: 3.6 mEq/L (ref 3.5–5.1)
Sodium: 139 mEq/L (ref 135–145)
Total Bilirubin: 0.4 mg/dL (ref 0.2–1.2)
Total Protein: 6.6 g/dL (ref 6.0–8.3)

## 2020-06-17 LAB — LIPID PANEL
Cholesterol: 146 mg/dL (ref 0–200)
HDL: 55.6 mg/dL (ref >39.00)
LDL Cholesterol: 77 mg/dL (ref 0–99)
NonHDL: 90.88
Total CHOL/HDL Ratio: 3
Triglycerides: 70 mg/dL (ref 0.0–149.0)
VLDL: 14 mg/dL (ref 0.0–40.0)

## 2020-06-17 LAB — TSH: TSH: 1.08 u[IU]/mL (ref 0.35–4.50)

## 2020-06-17 NOTE — Progress Notes (Signed)
Subjective:    Patient ID: Samantha Noble, female    DOB: [birth year], 35 y.o.   MRN: [id number]  Patient presents today for CPE  HPI  Sexual History (orientation,birth control, marital status, STD):sexually active, agreed to pelvic and breast exam. Agreed to STD screen.  Depression/Suicide:psychiatry and psychology by [facility] in [place]. Depression screen PHQ 2/9 06/17/2020  Decreased Interest 1  Down, Depressed, Hopeless 0  PHQ - 2 Score 1  Altered sleeping 1  Tired, decreased energy 0  Change in appetite 2  Feeling bad or failure about yourself  2  Trouble concentrating 3  Moving slowly or fidgety/restless 3  Suicidal thoughts 0  PHQ-9 Score 12  Difficult doing work/chores Very difficult   GAD 7 : Generalized Anxiety Score 06/17/2020  Nervous, Anxious, on Edge 3  Control/stop worrying 3  Worry too much - different things 3  Trouble relaxing 3  Restless 2  Easily annoyed or irritable 3  Afraid - awful might happen 3  Total GAD 7 Score 20  Anxiety Difficulty Extremely difficult   Vision:will schedule  Dental:will schedule  Immunizations: (TDAP, Hep C screen, Pneumovax, Influenza, zoster)  Health Maintenance  Topic Date Due  .  Hepatitis C: One time screening is recommended by Center for Disease Control  (CDC) for  adults born from [birth year] through 1963.   Never done  . COVID-19 Vaccine (1) Never done  . Tetanus Vaccine  Never done  . Flu Shot  07/07/2020*  . Pap Smear  06/18/2023  . HIV Screening  Completed  . HPV Vaccine  Aged Out  *Topic was postponed. The date shown is not the original due date.   Diet:regular. Exercise: none Weight:  Wt Readings from Last 3 Encounters:  06/17/20 135 lb (61.2 kg)  05/19/20 133 lb 8 oz (60.6 kg)  05/18/20 139 lb (63 kg)    Fall Risk: Fall Risk  06/17/2020  Falls in the past year? 0  Number falls in past yr: 0  Injury with Fall? 0  Risk for fall due to : No Fall Risks  Follow up Falls evaluation  completed   Medications and allergies reviewed with patient and updated if appropriate.  Patient Active Problem List   Diagnosis Date Noted  . Family history of breast cancer in first degree relative 06/17/2020  . Pneumomediastinum ([place]) 08/17/2018  . Retroperitoneal air   . Intra-abdominal free air of unknown etiology 08/13/2018  . Depression 08/16/2015  . Generalized abdominal pain 05/15/2015  . Acne 05/15/2015  . Anxiety 05/15/2015  . Attention-deficit hyperactivity disorder, predominantly hyperactive type 04/05/2015  . Panic attack as reaction to stress 04/05/2015    Current Outpatient Medications on File Prior to Visit  Medication Sig Dispense Refill  . acetaminophen (TYLENOL) 500 MG tablet Take 500 mg by mouth every 6 (six) hours as needed for mild pain.    . ADDERALL XR 10 MG 24 hr capsule Take 10 mg by mouth daily.    Marland Kitchen buPROPion (WELLBUTRIN) 75 MG tablet Take 75 mg by mouth 2 (two) times daily.    Marland Kitchen dicyclomine (BENTYL) 20 MG tablet Take 1 tablet (20 mg total) by mouth every 8 (eight) hours as needed for spasms. 60 tablet 0  . ondansetron (ZOFRAN-ODT) 4 MG disintegrating tablet Take 1 tablet (4 mg total) by mouth every 6 (six) hours as needed for nausea. 60 tablet 5  . polyethylene glycol powder (GLYCOLAX/MIRALAX) 17 GM/SCOOP powder Mix 1 capful in drink and take by mouth  one to three times daily as needed Until daily soft stools  OTC 225 g 0  . Probiotic Product (PROBIOTIC DAILY PO) Take by mouth.    . traZODone (DESYREL) 100 MG tablet 1-2 tablets at bedtime.     No current facility-administered medications on file prior to visit.    Past Medical History:  Diagnosis Date  . Acid reflux disease   . ADHD   . Anxiety   . Cannabis abuse   . Depression   . Gastritis   . GERD (gastroesophageal reflux disease)   . IBS (irritable bowel syndrome)   . Pancreatitis     Past Surgical History:  Procedure Laterality Date  . NO PAST SURGERIES    . WISDOM TOOTH EXTRACTION       Social History   Socioeconomic History  . Marital status: Single    Spouse name: Not on file  . Number of children: 0  . Years of education: Not on file  . Highest education level: Not on file  Occupational History  . Not on file  Tobacco Use  . Smoking status: Former Smoker    Packs/day: 0.10    Years: 2.00    Pack years: 0.20    Types: Cigarettes    Quit date: 07/2018    Years since quitting: 1.9  . Smokeless tobacco: Never Used  . Tobacco comment: Not cuuently  Vaping Use  . Vaping Use: Former  Substance and Sexual Activity  . Alcohol use: Yes    Comment: 01/18/2019 was the last day she dranked   . Drug use: Yes    Types: Marijuana    Comment: last smoked marijuana 01-20-2019  . Sexual activity: Yes    Birth control/protection: None  Other Topics Concern  . Not on file  Social History Narrative  . Not on file   Social Determinants of Health   Financial Resource Strain: Not on file  Food Insecurity: Not on file  Transportation Needs: Not on file  Physical Activity: Not on file  Stress: Not on file  Social Connections: Not on file    Family History  Problem Relation Age of Onset  . Breast cancer Mother   . Cancer Mother 31       Breast  . Irritable bowel syndrome Maternal Grandmother   . Crohn's disease Paternal Uncle   . Colon cancer Neg Hx   . Esophageal cancer Neg Hx   . Rectal cancer Neg Hx   . Stomach cancer Neg Hx        Review of Systems  Constitutional: Negative for fever, malaise/fatigue and weight loss.  HENT: Negative for congestion and sore throat.   Eyes:       Negative for visual changes  Respiratory: Negative for cough and shortness of breath.   Cardiovascular: Negative for chest pain, palpitations and leg swelling.  Gastrointestinal: Negative for blood in stool, constipation, diarrhea and heartburn.  Genitourinary: Negative for dysuria, frequency and urgency.  Musculoskeletal: Negative for falls, joint pain and myalgias.   Skin: Negative for rash.  Neurological: Negative for dizziness, sensory change and headaches.  Endo/Heme/Allergies: Does not bruise/bleed easily.  Psychiatric/Behavioral: Negative for depression, substance abuse and suicidal ideas. The patient is not nervous/anxious.    Objective:   Vitals:   06/17/20 1028  BP: 110/68  Pulse: 92  Temp: 97.7 F (36.5 C)  SpO2: 99%   Body mass index is 23.17 kg/m.  Physical Examination:  Physical Exam Vitals reviewed. Exam conducted with a chaperone  present.  Constitutional:      General: She is not in acute distress.    Appearance: She is well-developed.  HENT:     Right Ear: Tympanic membrane, ear canal and external ear normal.     Left Ear: Tympanic membrane, ear canal and external ear normal.  Eyes:     Extraocular Movements: Extraocular movements intact.     Conjunctiva/sclera: Conjunctivae normal.  Cardiovascular:     Rate and Rhythm: Normal rate and regular rhythm.     Pulses: Normal pulses.     Heart sounds: Normal heart sounds.  Pulmonary:     Effort: Pulmonary effort is normal. No respiratory distress.     Breath sounds: Normal breath sounds.  Chest:     Chest wall: No tenderness.  Breasts: Breasts are symmetrical.     Right: Normal. No axillary adenopathy or supraclavicular adenopathy.     Left: Normal. No axillary adenopathy or supraclavicular adenopathy.      Comments: Axilla scaring  And hyperpigmentation: no pustule or drainage Abdominal:     General: Bowel sounds are normal.     Palpations: Abdomen is soft.     Hernia: There is no hernia in the left inguinal area or right inguinal area.  Genitourinary:    General: Normal vulva.     Labia:        Right: No rash or tenderness.        Left: No rash or tenderness.      Vagina: Vaginal discharge present. No erythema or tenderness.     Cervix: Discharge present. No cervical motion tenderness, friability, lesion or erythema.     Uterus: Normal.      Adnexa: Right  adnexa normal and left adnexa normal.  Musculoskeletal:        General: Normal range of motion.     Cervical back: Normal range of motion and neck supple.  Lymphadenopathy:     Cervical: No cervical adenopathy.     Upper Body:     Right upper body: No supraclavicular, axillary or pectoral adenopathy.     Left upper body: No supraclavicular or axillary adenopathy.     Lower Body: No right inguinal adenopathy. No left inguinal adenopathy.  Skin:    General: Skin is warm and dry.  Neurological:     Mental Status: She is alert and oriented to person, place, and time.     Deep Tendon Reflexes: Reflexes are normal and symmetric.  Psychiatric:        Mood and Affect: Mood normal.        Behavior: Behavior normal.        Thought Content: Thought content normal.    ASSESSMENT and PLAN: This visit occurred during the SARS-CoV-2 public health emergency.  Safety protocols were in place, including screening questions prior to the visit, additional usage of staff PPE, and extensive cleaning of exam room while observing appropriate contact time as indicated for disinfecting solutions.   Stacee was seen today for establish care.  Diagnoses and all orders for this visit:  Encounter for preventative adult health care exam with abnormal findings -     Lipid panel -     CBC with Differential/Platelet -     Comprehensive metabolic panel -     TSH -     Cytology - PAP( [facility])  Amenorrhea -     POCT urine pregnancy  Encounter for lipid screening for cardiovascular disease -     Lipid panel  Encounter  for Papanicolaou smear for cervical cancer screening -     Cytology - PAP( [place])  Screen for STD (sexually transmitted disease) -     Cervicovaginal ancillary only( [facility]) -     HIV Antibody (routine testing w rflx) -     Hepatitis C Antibody -     RPR  BV (bacterial vaginosis) -     metroNIDAZOLE (METROGEL VAGINAL) 0.75 % vaginal gel; Place 1 Applicatorful vaginally at  bedtime.  negative urine pregnancy    Problem List Items Addressed This Visit   None   Visit Diagnoses    Encounter for preventative adult health care exam with abnormal findings    -  Primary   Relevant Orders   Lipid panel (Completed)   CBC with Differential/Platelet (Completed)   Comprehensive metabolic panel (Completed)   TSH (Completed)   Cytology - PAP( [facility]) (Completed)   Amenorrhea       Relevant Orders   POCT urine pregnancy   Encounter for lipid screening for cardiovascular disease       Relevant Orders   Lipid panel (Completed)   Encounter for Papanicolaou smear for cervical cancer screening       Relevant Orders   Cytology - PAP( [place]) (Completed)   Screen for STD (sexually transmitted disease)       Relevant Orders   Cervicovaginal ancillary only( [place]) (Completed)   HIV Antibody (routine testing w rflx)   Hepatitis C Antibody   RPR (Completed)   BV (bacterial vaginosis)       Relevant Medications   metroNIDAZOLE (METROGEL VAGINAL) 0.75 % vaginal gel      Follow up: Return if symptoms worsen or fail to improve.  Wilfred Lacy, NP

## 2020-06-17 NOTE — Patient Instructions (Signed)
Thank you for choosing [facility]  Go to lab for urine collection and blood draw.  Hidradenitis Suppurativa Hidradenitis suppurativa is a long-term (chronic) skin disease. It is similar to a severe form of acne, but it affects areas of the body where acne would be unusual, especially areas of the body where skin rubs against skin and becomes moist. These include:  Underarms.  Groin.  Genital area.  Buttocks.  Upper thighs.  Breasts. Hidradenitis suppurativa may start out as small lumps or pimples caused by blocked sweat glands or hair follicles. Pimples may develop into deep sores that break open (rupture) and drain pus. Over time, affected areas of skin may thicken and become scarred. This condition is rare and does not spread from person to person (non-contagious). What are the causes? The exact cause of this condition is not known. It may be related to:  Female and female hormones.  An overactive disease-fighting system (immune system). The immune system may over-react to blocked hair follicles or sweat glands and cause swelling and pus-filled sores. What increases the risk? You are more likely to develop this condition if you:  Are female.  Are 29-73 years old.  Have a family history of hidradenitis suppurativa.  Have a personal history of acne.  Are overweight.  Smoke.  Take the medicine lithium. What are the signs or symptoms? The first symptoms are usually painful bumps in the skin, similar to pimples. The condition may get worse over time (progress), or it may only cause mild symptoms. If the disease progresses, symptoms may include:  Skin bumps getting bigger and growing deeper into the skin.  Bumps rupturing and draining pus.  Itchy, infected skin.  Skin getting thicker and scarred.  Tunnels under the skin (fistulas) where pus drains from a bump.  Pain during daily activities, such as pain during walking if your groin area is  affected.  Emotional problems, such as stress or depression. This condition may affect your appearance and your ability or willingness to wear certain clothes or do certain activities. How is this diagnosed? This condition is diagnosed by a health care provider who specializes in skin diseases (dermatologist). You may be diagnosed based on:  Your symptoms and medical history.  A physical exam.  Testing a pus sample for infection.  Blood tests. How is this treated? Your treatment will depend on how severe your symptoms are. The same treatment will not work for everybody with this condition. You may need to try several treatments to find what works best for you. Treatment may include:  Cleaning and bandaging (dressing) your wounds as needed.  Lifestyle changes, such as new skin care routines.  Taking medicines, such as: ? Antibiotics. ? Acne medicines. ? Medicines to reduce the activity of the immune system. ? A diabetes medicine (metformin). ? Birth control pills, for women. ? Steroids to reduce swelling and pain.  Working with a mental health care provider, if you experience emotional distress due to this condition. If you have severe symptoms that do not get better with medicine, you may need surgery. Surgery may involve:  Using a laser to clear the skin and remove hair follicles.  Opening and draining deep sores.  Removing the areas of skin that are diseased and scarred. Follow these instructions at home: Medicines  Take over-the-counter and prescription medicines only as told by your health care provider.  If you were prescribed an antibiotic medicine, take it as told by your health care provider. Do not stop  taking the antibiotic even if your condition improves.   Skin care  If you have open wounds, cover them with a clean dressing as told by your health care provider. Keep wounds clean by washing them gently with soap and water when you bathe.  Do not shave the  areas where you get hidradenitis suppurativa.  Do not wear deodorant.  Wear loose-fitting clothes.  Try to avoid getting overheated or sweaty. If you get sweaty or wet, change into clean, dry clothes as soon as you can.  To help relieve pain and itchiness, cover sore areas with a warm, clean washcloth (warm compress) for 5-10 minutes as often as needed.  If told by your health care provider, take a bleach bath twice a week: ? Fill your bathtub halfway with water. ? Pour in  cup of unscented household bleach. ? Soak in the tub for 5-10 minutes. ? Only soak from the neck down. Avoid water on your face and hair. ? Shower to rinse off the bleach from your skin. General instructions  Learn as much as you can about your disease so that you have an active role in your treatment. Work closely with your health care provider to find treatments that work for you.  If you are overweight, work with your health care provider to lose weight as recommended.  Do not use any products that contain nicotine or tobacco, such as cigarettes and e-cigarettes. If you need help quitting, ask your health care provider.  If you struggle with living with this condition, talk with your health care provider or work with a mental health care provider as recommended.  Keep all follow-up visits as told by your health care provider. This is important. Where to find more information  Hidradenitis Mora.: https://www.hs-foundation.org/  American Academy of Dermatology: http://www.nguyen-hutchinson.com/ Contact a health care provider if you have:  A flare-up of hidradenitis suppurativa.  A fever or chills.  Trouble controlling your symptoms at home.  Trouble doing your daily activities because of your symptoms.  Trouble dealing with emotional problems related to your condition. Summary  Hidradenitis suppurativa is a long-term (chronic) skin disease. It is similar to a severe form of acne, but it  affects areas of the body where acne would be unusual.  The first symptoms are usually painful bumps in the skin, similar to pimples. The condition may only cause mild symptoms, or it may get worse over time (progress).  If you have open wounds, cover them with a clean dressing as told by your health care provider. Keep wounds clean by washing them gently with soap and water when you bathe.  Besides skin care, treatment may include medicines, laser treatment, and surgery. This information is not intended to replace advice given to you by your health care provider. Make sure you discuss any questions you have with your health care provider. Document Revised: 01/19/2020 Document Reviewed: 01/19/2020 Elsevier Patient Education  2021 [employer].

## 2020-06-19 LAB — CERVICOVAGINAL ANCILLARY ONLY
Bacterial Vaginitis (gardnerella): POSITIVE — AB
Candida Glabrata: NEGATIVE
Candida Vaginitis: NEGATIVE
Chlamydia: NEGATIVE
Comment: NEGATIVE
Comment: NEGATIVE
Comment: NEGATIVE
Comment: NEGATIVE
Comment: NEGATIVE
Comment: NORMAL
Neisseria Gonorrhea: NEGATIVE
Trichomonas: NEGATIVE

## 2020-06-20 LAB — CYTOLOGY - PAP
Adequacy: ABSENT
Comment: NEGATIVE
Diagnosis: NEGATIVE
High risk HPV: NEGATIVE

## 2020-06-20 LAB — POCT URINE PREGNANCY: Preg Test, Ur: NEGATIVE

## 2020-06-20 LAB — RPR: RPR Ser Ql: NONREACTIVE

## 2020-06-20 LAB — HIV ANTIBODY (ROUTINE TESTING W REFLEX): HIV 1&2 Ab, 4th Generation: NONREACTIVE

## 2020-06-20 LAB — HEPATITIS C ANTIBODY
Hepatitis C Ab: NONREACTIVE
SIGNAL TO CUT-OFF: 0.01 (ref <1.00)

## 2020-06-20 MED ORDER — METRONIDAZOLE 0.75 % VA GEL: 1.0000 | Freq: Every day | VAGINAL | 0 refills | Status: DC

## 2020-06-23 ENCOUNTER — Ambulatory Visit: Payer: BLUE CROSS/BLUE SHIELD | Admitting: Nurse Practitioner

## 2020-07-06 NOTE — Telephone Encounter (Signed)
Rx was sent to [facility] on 05/19/2020

## 2020-08-11 ENCOUNTER — Other Ambulatory Visit: Payer: Self-pay | Admitting: Gastroenterology

## 2020-08-11 DIAGNOSIS — R109 Unspecified abdominal pain: Secondary | ICD-10-CM

## 2020-08-11 DIAGNOSIS — R110 Nausea: Secondary | ICD-10-CM

## 2020-08-11 DIAGNOSIS — K581 Irritable bowel syndrome with constipation: Secondary | ICD-10-CM

## 2020-10-16 ENCOUNTER — Other Ambulatory Visit: Payer: Self-pay | Admitting: Gastroenterology

## 2021-02-28 ENCOUNTER — Ambulatory Visit (HOSPITAL_COMMUNITY)
Admission: EM | Admit: 2021-02-28 | Discharge: 2021-02-28 | Disposition: A | Discharge status: Home / Self Care | Payer: Federal, State, Local not specified - PPO | Attending: Emergency Medicine | Admitting: Emergency Medicine

## 2021-02-28 ENCOUNTER — Encounter (HOSPITAL_COMMUNITY): Payer: Self-pay | Admitting: Emergency Medicine

## 2021-02-28 ENCOUNTER — Other Ambulatory Visit: Payer: Self-pay

## 2021-02-28 DIAGNOSIS — D239 Other benign neoplasm of skin, unspecified: Secondary | ICD-10-CM | POA: Insufficient documentation

## 2021-02-28 DIAGNOSIS — L02213 Cutaneous abscess of chest wall: Secondary | ICD-10-CM | POA: Diagnosis not present

## 2021-02-28 DIAGNOSIS — L0291 Cutaneous abscess, unspecified: Secondary | ICD-10-CM | POA: Insufficient documentation

## 2021-02-28 MED ORDER — KETOROLAC TROMETHAMINE 30 MG/ML IJ SOLN
INTRAMUSCULAR | Status: AC
  Filled 2021-02-28: qty 1

## 2021-02-28 MED ORDER — SULFAMETHOXAZOLE-TRIMETHOPRIM 800-160 MG PO TABS: 1.0000 | ORAL_TABLET | Freq: Two times a day (BID) | ORAL | 0 refills | Status: AC

## 2021-02-28 MED ORDER — CEFTRIAXONE SODIUM 1 G IJ SOLR
1.0000 g | Freq: Once | INTRAMUSCULAR | Status: AC
  Administered 2021-02-28: 1 g via INTRAMUSCULAR

## 2021-02-28 MED ORDER — KETOROLAC TROMETHAMINE 30 MG/ML IJ SOLN
30.0000 mg | Freq: Once | INTRAMUSCULAR | Status: AC
  Administered 2021-02-28: 30 mg via INTRAMUSCULAR

## 2021-02-28 MED ORDER — LIDOCAINE HCL (PF) 1 % IJ SOLN
INTRAMUSCULAR | Status: AC
  Filled 2021-02-28: qty 4

## 2021-02-28 MED ORDER — CEFTRIAXONE SODIUM 1 G IJ SOLR
INTRAMUSCULAR | Status: AC
  Filled 2021-02-28: qty 10

## 2021-02-28 NOTE — Discharge Instructions (Addendum)
We were able to aspirate about 1/2cc from your abscess, a sample was sent to the lab for culture to determine need particular bacterial organism causing the infection.  You received an antibiotic injection of ceftriaxone in the office along with an injection of ketorolac for your pain.    Please begin Bactrim, 1 tablet twice daily for the next 10 days.  The results of your wound culture will be made available to you once it has been received and if adjustment to your antibiotic regimen is needed, a new prescription will be provided for you.  As we discussed, I recommend you follow-up with a new dermatologist, personally I prefer the [facility], I have provided you with their contact information in this summary.   I have also provided you with a handout from the [facility] clinic about dilated pore of Winer lesions, how they are cause and how to care for them.

## 2021-02-28 NOTE — ED Triage Notes (Signed)
Pt is present today with an recurrent abscess on the left side of her upper chest. Pt states that she noticed the abscess coming back x2 weeks ago.

## 2021-02-28 NOTE — ED Provider Notes (Addendum)
[name]    CSN: [id number] Arrival date & time: 02/28/21  1653    UC Diagnoses / Final Clinical Impressions(s)    Final diagnoses:  Dilated pore of Winer  Abscess   I have reviewed the triage vital signs and the nursing notes.  Pertinent labs & imaging results that were available during my care of the patient were reviewed by me and considered in my medical decision making (see chart for details).    Patient I discussed at length the risk of second incision in the general area to drain the lesion, patient advised that it is likely she will have a second, dark, possibly keloidal scar in the same area and that I am concerned about the acetic appearance of such as scar.  Patient also advised that it may be better to have an incision and drainage performed by her dermatologist, although I did offer her drainage via FNA.  Risks and benefits of both were discussed with patient.  Ultimately, lesion was partially drained with 18-gauge needle, approximately 1/2 cc of thick, dark purulent fluid was aspirated, sample sent for wound culture.  Patient was provided with an injection of ceftriaxone in the office and a 10-day prescription for Bactrim.  Patient was given the name of dermatologist to follow-up with her in the [place] area since she seems to be dissatisfied with her current dermatologist at [facility].  Patient/parent/caregiver verbalized understanding and agreement of plan as discussed.  All questions were addressed during visit.  Please see discharge instructions below for further details of plan.  ED Prescriptions     Medication Sig Dispense Auth. Provider   sulfamethoxazole-trimethoprim (BACTRIM DS) 800-160 MG tablet Take 1 tablet by mouth 2 (two) times daily for 10 days. 20 tablet Lynden Oxford Scales, PA-C      PDMP not reviewed this encounter.  Pending results:  Labs Reviewed  AEROBIC/ANAEROBIC CULTURE W GRAM STAIN (SURGICAL/DEEP  WOUND)     Medications Ordered in UC: Medications  cefTRIAXone (ROCEPHIN) injection 1 g (has no administration in time range)  ketorolac (TORADOL) 30 MG/ML injection 30 mg (has no administration in time range)    Discharge Instructions:   Discharge Instructions      We were able to aspirate about 1/2cc from your abscess, a sample was sent to the lab for culture to determine need particular bacterial organism causing the infection.  You received an antibiotic injection of ceftriaxone in the office along with an injection of ketorolac for your pain.    Please begin Bactrim, 1 tablet twice daily for the next 10 days.  The results of your wound culture will be made available to you once it has been received and if adjustment to your antibiotic regimen is needed, a new prescription will be provided for you.  As we discussed, I recommend you follow-up with a new dermatologist, personally I prefer the [facility], I have provided you with their contact information in this summary.   I have also provided you with a handout from the [facility] clinic about dilated pore of Winer lesions, how they are cause and how to care for them.        HISTORY   Chief Complaint  Patient presents with   Abscess   HPI Samantha Noble is a 35 y.o. female. Pt is present today with an recurrent abscess on the left side of her upper chest. Pt states that she noticed the abscess coming back x2 weeks ago.  EMR reviewed, patient has been seen multiple times by dermatology for report of 1 year lesion on her left upper chest.  This lesion has been removed several times only to have it recur.  Patient does not demonstrate full understanding of what type of lesion this is.  States that she always notices coming back when he begins to itch, states she scratches it frequently and then it gets infected.  Patient states that this is the largest the infection has ever been, states it is now red, painful,  being aggravated by her clothing, patient is here today requesting incision and drainage of the lesion.  The history is provided by the patient.  Past Medical History:  Diagnosis Date   Acid reflux disease    ADHD    Anxiety    Cannabis abuse    Depression    Gastritis    GERD (gastroesophageal reflux disease)    IBS (irritable bowel syndrome)    Pancreatitis    Patient Active Problem List   Diagnosis Date Noted   Family history of breast cancer in first degree relative 06/17/2020   Pneumomediastinum ([place]) 08/17/2018   Retroperitoneal air    Intra-abdominal free air of unknown etiology 08/13/2018   Depression 08/16/2015   Generalized abdominal pain 05/15/2015   Acne 05/15/2015   Anxiety 05/15/2015   Attention-deficit hyperactivity disorder, predominantly hyperactive type 04/05/2015   Panic attack as reaction to stress 04/05/2015   Past Surgical History:  Procedure Laterality Date   NO PAST SURGERIES     WISDOM TOOTH EXTRACTION     OB History     Gravida  0   Para      Term      Preterm      AB      Living         SAB      IAB      Ectopic      Multiple      Live Births             Home Medications    Prior to Admission medications   Medication Sig Start Date End Date Taking? Authorizing Provider  sulfamethoxazole-trimethoprim (BACTRIM DS) 800-160 MG tablet Take 1 tablet by mouth 2 (two) times daily for 10 days. 02/28/21 03/10/21 Yes Lynden Oxford Scales, PA-C  acetaminophen (TYLENOL) 500 MG tablet Take 500 mg by mouth every 6 (six) hours as needed for mild pain.    [provider]  ADDERALL XR 10 MG 24 hr capsule Take 10 mg by mouth daily. 04/05/20   [provider]  buPROPion (WELLBUTRIN) 75 MG tablet Take 75 mg by mouth 2 (two) times daily. 04/25/20   [provider]  dicyclomine (BENTYL) 20 MG tablet TAKE 1 TABLET (20 MG TOTAL) BY MOUTH EVERY 8 (EIGHT) HOURS AS NEEDED FOR SPASMS. 10/17/20   Cirigliano, Vito V, DO   traZODone (DESYREL) 100 MG tablet 1-2 tablets at bedtime. 05/06/20   [provider]   Family History Family History  Problem Relation Age of Onset   Breast cancer Mother    Cancer Mother 36       Breast   Irritable bowel syndrome Maternal Grandmother    Crohn's disease Paternal Uncle    Colon cancer Neg Hx    Esophageal cancer Neg Hx    Rectal cancer Neg Hx    Stomach cancer Neg Hx    Social History Social History   Tobacco Use   Smoking status: Former  Packs/day: 0.10    Years: 2.00    Pack years: 0.20    Types: Cigarettes    Quit date: 07/2018    Years since quitting: 2.6   Smokeless tobacco: Never   Tobacco comments:    Not cuuently  Vaping Use   Vaping Use: Former  Substance Use Topics   Alcohol use: Yes    Comment: 01/18/2019 was the last day she dranked    Drug use: Yes    Types: Marijuana    Comment: last smoked marijuana 01-20-2019   Allergies   Capsaicin  Review of Systems Review of Systems Pertinent findings noted in history of present illness.   Physical Exam Triage Vital Signs ED Triage Vitals  Enc Vitals Group     BP 02/03/21 0827 (!) 147/82     Pulse Rate 02/03/21 0827 72     Resp 02/03/21 0827 18     Temp 02/03/21 0827 98.3 F (36.8 C)     Temp Source 02/03/21 0827 Oral     SpO2 02/03/21 0827 98 %     Weight --      Height --      Head Circumference --      Peak Flow --      Pain Score 02/03/21 0826 5     Pain Loc --      Pain Edu? --      Excl. in [place]? --   No data found.  Updated Vital Signs BP 132/83   Pulse 92   Temp 98.2 F (36.8 C)   Resp 18   SpO2 100%   Physical Exam Vitals and nursing note reviewed.  Constitutional:      General: She is not in acute distress.    Appearance: Normal appearance. She is not ill-appearing.  HENT:     Head: Normocephalic and atraumatic.  Eyes:     General: Lids are normal.        Right eye: No discharge.        Left eye: No discharge.     Extraocular Movements:  Extraocular movements intact.     Conjunctiva/sclera: Conjunctivae normal.     Right eye: Right conjunctiva is not injected.     Left eye: Left conjunctiva is not injected.  Neck:     Trachea: Trachea and phonation normal.  Cardiovascular:     Rate and Rhythm: Normal rate and regular rhythm.     Pulses: Normal pulses.     Heart sounds: Normal heart sounds. No murmur heard.   No friction rub. No gallop.  Pulmonary:     Effort: Pulmonary effort is normal. No accessory muscle usage, prolonged expiration or respiratory distress.     Breath sounds: Normal breath sounds. No stridor, decreased air movement or transmitted upper airway sounds. No decreased breath sounds, wheezing, rhonchi or rales.  Chest:     Chest wall: No tenderness.  Musculoskeletal:        General: Normal range of motion.     Cervical back: Normal range of motion and neck supple. Normal range of motion.  Lymphadenopathy:     Cervical: No cervical adenopathy.  Skin:    General: Skin is warm and dry.     Findings: Lesion (Left upper chest, there is a 2 x 1 cm area of fluctuance that is surrounded by induration and erythema, diffusely tender to palpation.  Immediately superior to this lesion is a previous incision scar that is darkened and has mild keloid formation.) present. No  erythema or rash.  Neurological:     General: No focal deficit present.     Mental Status: She is alert and oriented to person, place, and time.  Psychiatric:        Mood and Affect: Mood normal.        Behavior: Behavior normal.    Visual Acuity Right Eye Distance:   Left Eye Distance:   Bilateral Distance:    Right Eye Near:   Left Eye Near:    Bilateral Near:     UC Couse / Diagnostics / Procedures:    EKG  Radiology No results found.  Procedures Incision and Drainage  Date/Time: 02/28/2021 6:52 PM Performed by: Lynden Oxford Scales, PA-C Authorized by: Lynden Oxford Scales, PA-C   Consent:    Consent obtained:   Verbal   Consent given by:  Patient   Risks, benefits, and alternatives were discussed: yes     Risks discussed:  Bleeding, incomplete drainage and pain   Alternatives discussed:  No treatment, delayed treatment and observation Universal protocol:    Procedure explained and questions answered to patient or proxy's satisfaction: yes     Patient identity confirmed:  Verbally with patient Location:    Type:  Abscess   Size:  2   Location: left upper chest. Pre-procedure details:    Skin preparation:  Povidone-iodine Sedation:    Sedation type:  None Anesthesia:    Anesthesia method:  None Procedure type:    Complexity:  Simple Procedure details:    Needle aspiration: yes     Incision types:  Single straight   Incision depth:  Dermal   Drainage:  Purulent   Drainage amount:  Scant   Packing materials:  None Post-procedure details:    Procedure completion:  Tolerated (including critical care time)  Disposition Upon Discharge:  Patient presented with an acute illness with associated systemic symptoms and significant discomfort requiring urgent management. In my opinion, this is a condition that a prudent lay person (someone who possesses an average knowledge of health and medicine) may potentially expect to result in complications if not addressed urgently such as respiratory distress, impairment of bodily function or dysfunction of bodily organs.   Routine symptom specific, illness specific and/or disease specific instructions were discussed with the patient and/or caregiver at length.   As such, the patient has been evaluated and assessed, work-up was performed and treatment was provided in alignment with urgent care protocols and evidence based medicine.  Patient/parent/caregiver has been advised that the patient may require follow up for further testing and treatment if the symptoms continue in spite of treatment, as clinically indicated and appropriate.  If th patient was tested  for COVID-19, Influenza and/or RSV, then the patient/parent/guardian was advised to isolate at home pending the results of his/her diagnostic coronavirus test and potentially longer if they're positive. I have also advised pt that if his/her COVID-19 test returns positive, it's recommended to self-isolate for at least 10 days after symptoms first appeared AND until fever-free for 24 hours without fever reducer AND other symptoms have improved or resolved. Discussed self-isolation recommendations as well as instructions for household member/close contacts as per the [facility] and [place] DHHS, and also gave patient the [place] packet with this information.  Patient/parent/caregiver has been advised to return to the [facility] or PCP in 3-5 days if no better; to PCP or the Emergency Department if new signs and symptoms develop, or if the current signs or symptoms continue to change or worsen  for further workup, evaluation and treatment as clinically indicated and appropriate  The patient will follow up with their current PCP if and as advised. If the patient does not currently have a PCP we will assist them in obtaining one.   The patient may need specialty follow up if the symptoms continue, in spite of conservative treatment and management, for further workup, evaluation, consultation and treatment as clinically indicated and appropriate.  Condition: stable for discharge home Home: take medications as prescribed; routine discharge instructions as discussed; follow up as advised.    Lynden Oxford Scales, PA-C 02/28/21 1859    Lynden Oxford Scales, PA-C 02/28/21 1859

## 2021-03-01 ENCOUNTER — Ambulatory Visit (HOSPITAL_COMMUNITY): Payer: Self-pay

## 2021-03-04 LAB — AEROBIC CULTURE W GRAM STAIN (SUPERFICIAL SPECIMEN): Culture: NO GROWTH

## 2021-05-09 DIAGNOSIS — R2232 Localized swelling, mass and lump, left upper limb: Principal | ICD-10-CM

## 2021-05-20 ENCOUNTER — Other Ambulatory Visit: Payer: Self-pay | Admitting: Gastroenterology

## 2021-05-20 DIAGNOSIS — R109 Unspecified abdominal pain: Secondary | ICD-10-CM

## 2021-05-20 DIAGNOSIS — K581 Irritable bowel syndrome with constipation: Secondary | ICD-10-CM

## 2021-05-20 DIAGNOSIS — R110 Nausea: Secondary | ICD-10-CM

## 2021-05-22 ENCOUNTER — Other Ambulatory Visit: Payer: Self-pay | Admitting: General Surgery

## 2021-05-22 ENCOUNTER — Telehealth: Payer: Self-pay | Admitting: Gastroenterology

## 2021-05-22 MED ORDER — DICYCLOMINE HCL 20 MG PO TABS: 20.0000 mg | ORAL_TABLET | Freq: Three times a day (TID) | ORAL | 0 refills | Status: DC | PRN

## 2021-05-22 NOTE — Telephone Encounter (Signed)
Inbound call from patient requesting additional refills for dicyclomine and zofran medications to be sent to pharmacy in chart please.

## 2021-05-23 ENCOUNTER — Other Ambulatory Visit: Payer: Self-pay | Admitting: Gastroenterology

## 2021-05-23 DIAGNOSIS — K581 Irritable bowel syndrome with constipation: Secondary | ICD-10-CM

## 2021-05-23 DIAGNOSIS — R109 Unspecified abdominal pain: Secondary | ICD-10-CM

## 2021-05-23 DIAGNOSIS — R110 Nausea: Secondary | ICD-10-CM

## 2021-05-25 ENCOUNTER — Ambulatory Visit: Admit: 2021-05-25 | Discharge: 2021-05-26 | Payer: PRIVATE HEALTH INSURANCE

## 2021-05-25 DIAGNOSIS — R928 Other abnormal and inconclusive findings on diagnostic imaging of breast: Principal | ICD-10-CM

## 2021-05-25 DIAGNOSIS — R2232 Localized swelling, mass and lump, left upper limb: Principal | ICD-10-CM

## 2021-06-09 ENCOUNTER — Ambulatory Visit: Admit: 2021-06-09 | Discharge: 2021-06-10 | Payer: PRIVATE HEALTH INSURANCE

## 2021-06-12 ENCOUNTER — Other Ambulatory Visit: Payer: Self-pay | Admitting: Gastroenterology

## 2021-06-12 DIAGNOSIS — K581 Irritable bowel syndrome with constipation: Secondary | ICD-10-CM

## 2021-06-12 DIAGNOSIS — R110 Nausea: Secondary | ICD-10-CM

## 2021-06-12 DIAGNOSIS — R109 Unspecified abdominal pain: Secondary | ICD-10-CM

## 2021-06-13 ENCOUNTER — Other Ambulatory Visit: Payer: Self-pay | Admitting: Gastroenterology

## 2021-06-20 ENCOUNTER — Other Ambulatory Visit: Payer: Self-pay

## 2021-06-20 ENCOUNTER — Encounter: Payer: Self-pay | Admitting: Gastroenterology

## 2021-06-20 ENCOUNTER — Ambulatory Visit (INDEPENDENT_AMBULATORY_CARE_PROVIDER_SITE_OTHER): Payer: Federal, State, Local not specified - PPO | Admitting: Gastroenterology

## 2021-06-20 VITALS — BP 140/88 | HR 60 | Ht 64.0 in | Wt 179.5 lb

## 2021-06-20 DIAGNOSIS — K581 Irritable bowel syndrome with constipation: Secondary | ICD-10-CM

## 2021-06-20 DIAGNOSIS — R110 Nausea: Secondary | ICD-10-CM

## 2021-06-20 DIAGNOSIS — R109 Unspecified abdominal pain: Secondary | ICD-10-CM

## 2021-06-20 DIAGNOSIS — R635 Abnormal weight gain: Secondary | ICD-10-CM | POA: Diagnosis not present

## 2021-06-20 MED ORDER — ONDANSETRON 4 MG PO TBDP: 4.0000 mg | ORAL_TABLET | Freq: Four times a day (QID) | ORAL | 1 refills | Status: DC | PRN

## 2021-06-20 MED ORDER — DICYCLOMINE HCL 20 MG PO TABS: 20.0000 mg | ORAL_TABLET | Freq: Three times a day (TID) | ORAL | 5 refills | Status: DC | PRN

## 2021-06-20 NOTE — Patient Instructions (Addendum)
If you are age 34 or older, your body mass index should be between 23-30. Your Body mass index is 30.81 kg/m?Marland Kitchen If this is out of the aforementioned range listed, please consider follow up with your Primary Care Provider. ? ?If you are age 63 or younger, your body mass index should be between 19-25. Your Body mass index is 30.81 kg/m?Marland Kitchen If this is out of the aformentioned range listed, please consider follow up with your Primary Care Provider.  ? ?__________________________________________________________ ? ?The [place] [facility] providers would like to encourage you to use [facility] to communicate with providers for non-urgent requests or questions.  Due to long hold times on the telephone, sending your provider a message by [facility] may be a faster and more efficient way to get a response.  Please allow 48 business hours for a response.  Please remember that this is for non-urgent requests.  ? ?Due to recent changes in healthcare laws, you may see the results of your imaging and laboratory studies on MyChart before your provider has had a chance to review them.  We understand that in some cases there may be results that are confusing or concerning to you. Not all laboratory results come back in the same time frame and the provider may be waiting for multiple results in order to interpret others.  Please give [place] 48 hours in order for your provider to thoroughly review all the results before contacting the office for clarification of your results.   ? ?We have sent the following medications to your pharmacy for you to pick up at your convenience: Bentyl, Zofran ? ?You will be contacted by [facility] to schedule a consult. ?Thank you for choosing me and [facility]. ? ?Gerrit Heck, D.O.  ? ?

## 2021-06-20 NOTE — Progress Notes (Signed)
? ?Chief Complaint:    Medication refill, IBS ? ?GI History: 36 y.o. female with a history of anxiety disorder, GERD, gastritis, IBS, history of pancreatitis, marijuana use.  History of admission in 08/2018 with retroperitoneal free air on CT and readmission later that month with pneumomediastinum (left AMA).  Please see note dated 09/30/2019 for full details of that work-up and nonoperative management.  ?  ?Evaluation to date: ?- CT (08/13/2018): Free air in right retroperitoneum, with source suspected to be retroperitoneal portion of a sending colon, but no clear point perforation identified.  Mild ascending/transverse colitis ?- UGI series (08/14/2018): Normal stomach, mildly prominent distal duodenum.  No extravasation ?- CT (08/15/2018): Diminished pneumomediastinum, retroperitoneal air and chest/abdominal wall air.  Etiology felt to be chest related.  No intra-abdominal pathology ?- CT C/A/P (08/17/2018): Moderate pneumomediastinum extending into the neck and decreased right retroperitoneal/right lateral abdominal subcutaneous gas.  Likely represents rupture of a central airway with extension into the neck and abdomen.  No pneumothorax ?-Barium esophagram (08/18/2018): Normal.  No leak.  No HH or reflux ?  ?Endoscopic History: ?-EGD (01/21/2019, Dr. Bryan Lemma): Mild luminal deformity prepyloric antrum consistent with previously healed ulcer, mild gastritis, normal esophagus/duodenum ?-Colonoscopy (01/21/2019, Dr. Bryan Lemma): Single aphthous ulcer in TI (biopsies benign), internal hemorrhoids, otherwise normal colonoscopy.  Repeat age 51 ?  ?Separately, she has a Hx of IBS-C. Cramping previously well controlled with intermittent use of Bentyl. Otherwise, does well with dietary mods and avoiding trigger type foods. Sxs worse with anxiety. Can also have nausea with abd cramping. Sxs present for at least 5 years.   ?  ?No known family history of CRC, GI malignancy, liver disease, pancreatic disease, or IBD.  ? ?HPI:    ? ? ?Patient is a 36 y.o. female presenting to the Gastroenterology Clinic for follow-up.  Last seen by me on 05/19/2020.  Main issue at that time was constipation and left-sided abdominal pain with several ER evaluations, all relatively unremarkable.  Was treated with MiraLAX purge then 1 cap/day and titrate to effect along with Bentyl 20 mg prn and stopped marijuana. ? ?Has been feeling well since last appt. Occasional abdominal cramping, tends to be post prandial, but responds well to dicyclomine. Has gained nearly 50# since last appt. Attributes this to increased eating at nighttime.  Still occasional use of Zofran for nausea with increased IBS exacerbation episodes. ? ? ?Review of systems:     No chest pain, no SOB, no fevers, no urinary sx  ? ?Past Medical History:  ?Diagnosis Date  ? Acid reflux disease   ? ADHD   ? Anxiety   ? Cannabis abuse   ? Depression   ? Gastritis   ? GERD (gastroesophageal reflux disease)   ? IBS (irritable bowel syndrome)   ? Pancreatitis   ? ? ?Patient's surgical history, family medical history, social history, medications and allergies were all reviewed in Epic  ? ? ?Current Outpatient Medications  ?Medication Sig Dispense Refill  ? ondansetron (ZOFRAN-ODT) 4 MG disintegrating tablet TAKE 1 TABLET BY MOUTH EVERY 6 HOURS AS NEEDED FOR NAUSEA 18 tablet 0  ? dicyclomine (BENTYL) 20 MG tablet Take 1 tablet (20 mg total) by mouth every 8 (eight) hours as needed for up to 20 days for spasms. Need to schedule an appointment for any additional refills. 20 tablet 0  ? ?No current facility-administered medications for this visit.  ? ? ?Physical Exam:   ? ? ?BP 140/88   Pulse 60   Ht 5'  4" (1.626 m)   Wt 179 lb 8 oz (81.4 kg)   BMI 30.81 kg/m?  ? ?GENERAL:  Pleasant female in NAD ?PSYCH: : Cooperative, normal affect ?Musculoskeletal:  Normal muscle tone, normal strength ?NEURO: Alert and oriented x 3, no focal neurologic deficits ? ? ?IMPRESSION and PLAN:   ? ?1) IBS-C ?2) Nausea ?3)  Abdominal cramping ? ?- Bentyl 20 mg prn refilled with RF 5 ?- Zofran 4 mg ODT as needed refilled with RF 5 ?- Continue adequate hydration ?- Can use MiraLAX as needed if exacerbation ?- Continue dietary modifications with healthy eating habits ? ?4) Weight gain ?- Has gained approximately 50#over the last year or so.  Discussed healthy eating habits at length today.  Would caution against using newer weight loss agents due to GI side effect profile and her underlying IBS ?- Per patient request, referral to [facility] for assistance in weight loss ? ? ?- RTC in 12 months or sooner prn ? ?      ? ?Lavena Bullion ,DO, FACG 06/20/2021, 4:18 PM ? ?

## 2021-06-21 ENCOUNTER — Ambulatory Visit
Admit: 2021-06-21 | Discharge: 2021-06-22 | Payer: PRIVATE HEALTH INSURANCE | Attending: Dermatology | Primary: Dermatology

## 2021-06-21 DIAGNOSIS — L732 Hidradenitis suppurativa: Principal | ICD-10-CM

## 2021-06-21 DIAGNOSIS — R2232 Localized swelling, mass and lump, left upper limb: Principal | ICD-10-CM

## 2021-06-21 MED ORDER — SPIRONOLACTONE 50 MG TABLET
ORAL_TABLET | 2 refills | 0 days | Status: CP
Start: 2021-06-21 — End: ?

## 2021-06-21 MED ORDER — DOXYCYCLINE HYCLATE 100 MG TABLET
ORAL_TABLET | Freq: Two times a day (BID) | ORAL | 1 refills | 30 days | Status: CP
Start: 2021-06-21 — End: ?

## 2021-06-21 MED ORDER — CLINDAMYCIN PHOSPHATE 1 % TOPICAL SWAB
5 refills | 0 days | Status: CP
Start: 2021-06-21 — End: ?

## 2021-09-13 DIAGNOSIS — L732 Hidradenitis suppurativa: Principal | ICD-10-CM

## 2021-09-13 MED ORDER — DOXYCYCLINE HYCLATE 100 MG TABLET
ORAL_TABLET | Freq: Two times a day (BID) | ORAL | 1 refills | 30.00000 days | Status: CP
Start: 2021-09-13 — End: ?

## 2021-09-20 DIAGNOSIS — L732 Hidradenitis suppurativa: Principal | ICD-10-CM

## 2021-09-21 MED ORDER — DOXYCYCLINE HYCLATE 100 MG TABLET
ORAL_TABLET | Freq: Two times a day (BID) | ORAL | 1 refills | 30 days | Status: CP
Start: 2021-09-21 — End: ?

## 2021-11-15 ENCOUNTER — Ambulatory Visit: Admit: 2021-11-15 | Discharge: 2021-11-16 | Payer: PRIVATE HEALTH INSURANCE

## 2021-11-15 DIAGNOSIS — R222 Localized swelling, mass and lump, trunk: Principal | ICD-10-CM

## 2021-11-15 DIAGNOSIS — L732 Hidradenitis suppurativa: Principal | ICD-10-CM

## 2021-11-20 ENCOUNTER — Other Ambulatory Visit: Payer: Self-pay

## 2021-11-20 DIAGNOSIS — K581 Irritable bowel syndrome with constipation: Secondary | ICD-10-CM

## 2021-11-20 DIAGNOSIS — R109 Unspecified abdominal pain: Secondary | ICD-10-CM

## 2021-11-20 DIAGNOSIS — R110 Nausea: Secondary | ICD-10-CM

## 2021-11-20 IMAGING — DX DG ABDOMEN 1V
1 series · 1 of 1 positions shown · non-contrast
Comparison: CT Abdomen and Pelvis 05/09/2020.

CLINICAL DATA: 34-year-old female with abdominal and low back pain.
No bowel movement x8 days.

EXAM:
ABDOMEN - 1 VIEW

[abdomen kub]
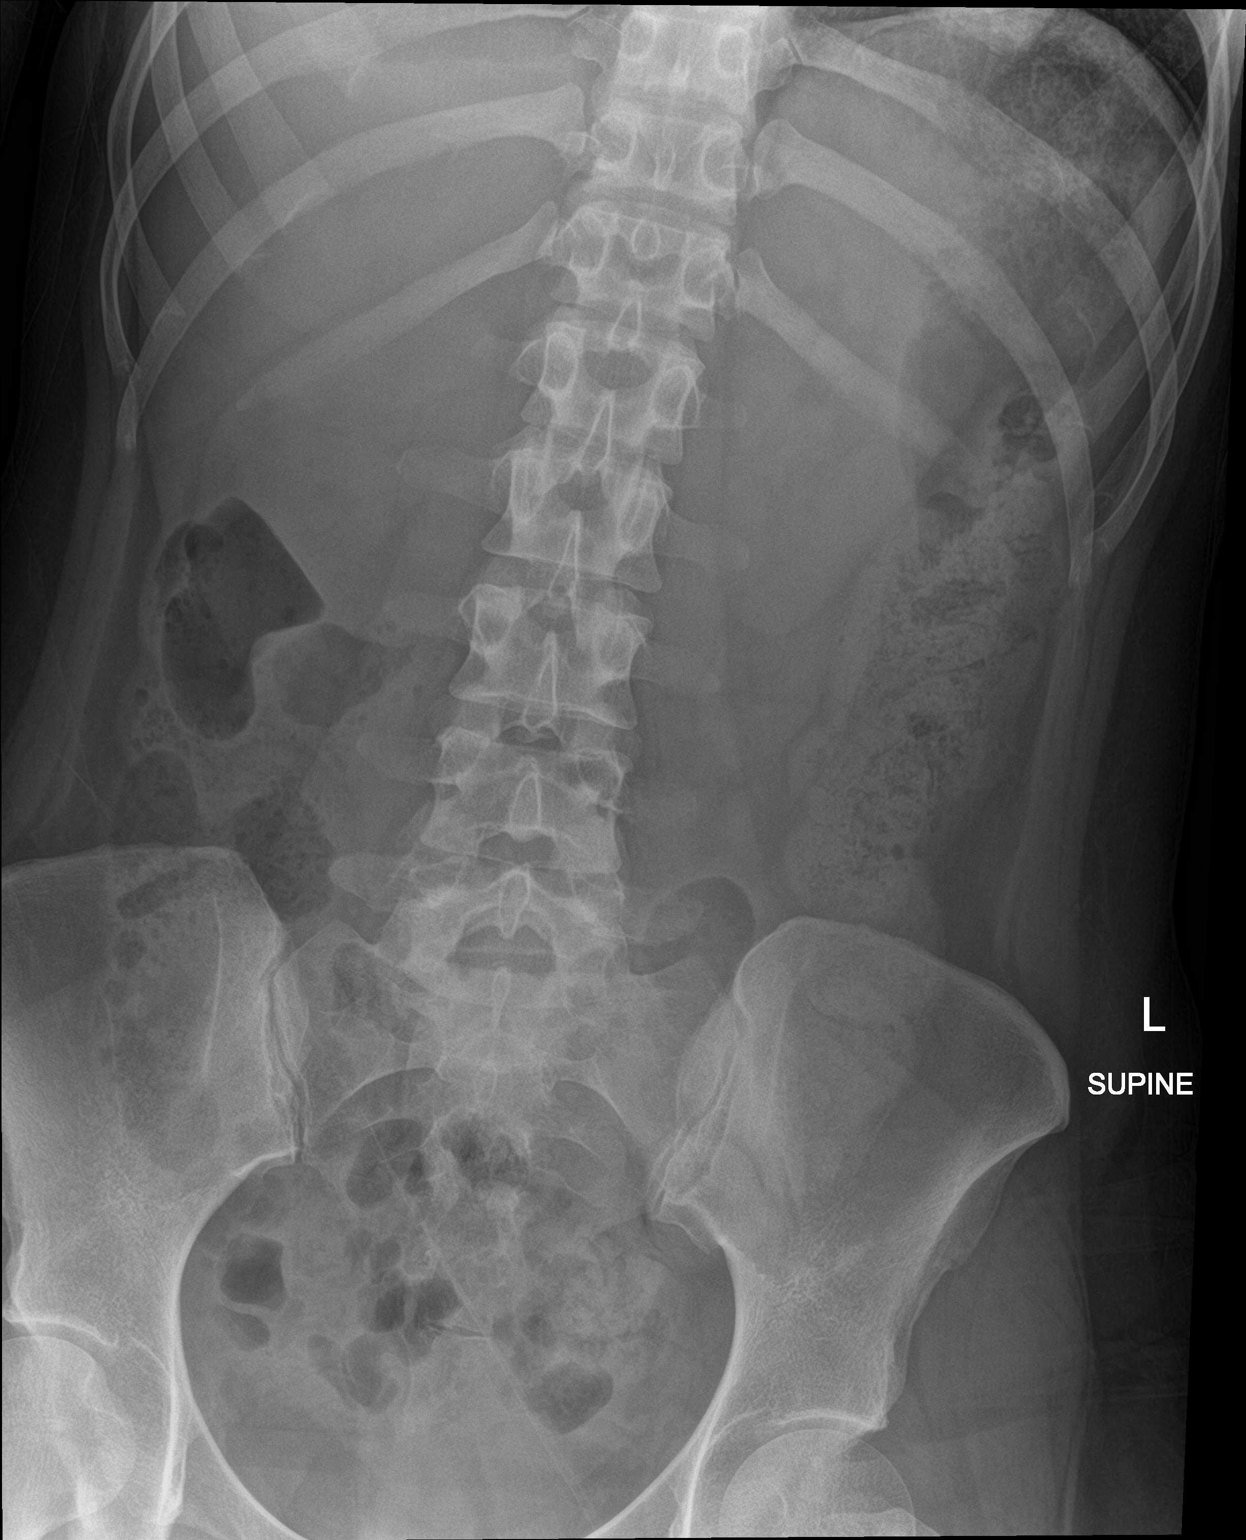

[1 of 1 positions shown; findings below may reference images not displayed]

FINDINGS: Supine view at 6444 hours. Non obstructed bowel gas pattern.
Retained stool from the splenic flexure distally, similar to the CT
scout view in [REDACTED]. Subtle scoliosis. No acute osseous
abnormality identified. Normal abdominal and pelvic visceral
contours.
IMPRESSION: Nonobstructed bowel-gas pattern with moderate retained stool in the
colon similar to the CT 05/09/2020.

## 2021-11-20 MED ORDER — ONDANSETRON 4 MG PO TBDP: 4.0000 mg | ORAL_TABLET | Freq: Four times a day (QID) | ORAL | 1 refills | Status: DC | PRN

## 2021-11-24 DIAGNOSIS — R928 Other abnormal and inconclusive findings on diagnostic imaging of breast: Principal | ICD-10-CM

## 2021-12-14 ENCOUNTER — Ambulatory Visit: Admit: 2021-12-14 | Discharge: 2021-12-15 | Payer: PRIVATE HEALTH INSURANCE

## 2021-12-14 DIAGNOSIS — R928 Other abnormal and inconclusive findings on diagnostic imaging of breast: Principal | ICD-10-CM

## 2021-12-14 DIAGNOSIS — Z803 Family history of malignant neoplasm of breast: Principal | ICD-10-CM

## 2022-01-15 ENCOUNTER — Ambulatory Visit: Admit: 2022-01-15 | Discharge: 2022-01-16 | Payer: PRIVATE HEALTH INSURANCE

## 2022-01-15 DIAGNOSIS — R222 Localized swelling, mass and lump, trunk: Principal | ICD-10-CM

## 2022-01-26 ENCOUNTER — Ambulatory Visit: Admit: 2022-01-26 | Discharge: 2022-01-27 | Payer: PRIVATE HEALTH INSURANCE

## 2022-01-26 DIAGNOSIS — Z803 Family history of malignant neoplasm of breast: Principal | ICD-10-CM

## 2022-01-26 DIAGNOSIS — R222 Localized swelling, mass and lump, trunk: Principal | ICD-10-CM

## 2022-01-26 DIAGNOSIS — R1084 Generalized abdominal pain: Principal | ICD-10-CM

## 2022-01-26 DIAGNOSIS — F32A Depression, unspecified depression type: Principal | ICD-10-CM

## 2022-02-16 ENCOUNTER — Ambulatory Visit: Admit: 2022-02-16 | Discharge: 2022-02-17 | Payer: PRIVATE HEALTH INSURANCE

## 2022-02-19 ENCOUNTER — Ambulatory Visit: Admit: 2022-02-19 | Discharge: 2022-02-20 | Payer: PRIVATE HEALTH INSURANCE

## 2022-02-23 ENCOUNTER — Ambulatory Visit: Admit: 2022-02-23 | Discharge: 2022-02-24 | Payer: PRIVATE HEALTH INSURANCE

## 2022-03-05 DIAGNOSIS — L732 Hidradenitis suppurativa: Principal | ICD-10-CM

## 2022-03-06 DIAGNOSIS — L732 Hidradenitis suppurativa: Principal | ICD-10-CM

## 2022-03-06 MED ORDER — DOXYCYCLINE HYCLATE 100 MG TABLET
ORAL_TABLET | Freq: Two times a day (BID) | ORAL | 0 refills | 14.00000 days | Status: CP
Start: 2022-03-06 — End: 2022-03-20

## 2022-03-13 ENCOUNTER — Ambulatory Visit: Admit: 2022-03-13 | Discharge: 2022-03-14 | Payer: PRIVATE HEALTH INSURANCE

## 2022-03-13 MED ORDER — HYDROCODONE 5 MG-ACETAMINOPHEN 325 MG TABLET
ORAL_TABLET | Freq: Four times a day (QID) | ORAL | 0 refills | 4.00000 days | Status: CP | PRN
Start: 2022-03-13 — End: ?

## 2022-03-13 MED ORDER — HUMIRA(CF) PEN 80 MG/0.8 ML SUBCUTANEOUS KIT
SUBCUTANEOUS | 0 refills | 0.00000 days | Status: CP
Start: 2022-03-13 — End: ?

## 2022-03-13 MED ORDER — ADALIMUMAB PEN CITRATE FREE 40 MG/0.4 ML
SUBCUTANEOUS | 11 refills | 0.00000 days | Status: CP
Start: 2022-03-13 — End: ?
  Filled 2022-03-27: qty 3, 28d supply, fill #0

## 2022-03-13 MED ORDER — AMOXICILLIN 875 MG-POTASSIUM CLAVULANATE 125 MG TABLET
ORAL_TABLET | Freq: Two times a day (BID) | ORAL | 2 refills | 30.00000 days | Status: CP
Start: 2022-03-13 — End: 2022-06-11

## 2022-03-15 DIAGNOSIS — L732 Hidradenitis suppurativa: Principal | ICD-10-CM

## 2022-03-20 MED ORDER — HYDROCODONE 5 MG-ACETAMINOPHEN 325 MG TABLET
ORAL_TABLET | Freq: Four times a day (QID) | ORAL | 0 refills | 4.00000 days | Status: CP | PRN
Start: 2022-03-20 — End: ?

## 2022-03-23 MED ORDER — EMPTY CONTAINER
2 refills | 0 days
Start: 2022-03-23 — End: ?

## 2022-03-23 NOTE — Unmapped (Signed)
[facility]   Patient Onboarding/Medication Counseling    Deanna Flores is a 35 y.o. female with hidradenitis suppurativa who I am counseling today on initiation of therapy.  I am speaking to the patient.    Was a Nurse, learning disability used for this call? No    Verified patient's date of birth / HIPAA.    Specialty medication(s) to be sent: Inflammatory Disorders: Humira      Non-specialty medications/supplies to be sent: sharps kit      Medications not needed at this time: na         Humira (adalimumab)    Medication & Administration     Dosage: Hidradenitis supprativa: Inject 160mg  under the skin on day 1, 80mg  on day 15, then 40mg  every 7 days starting on day 29    Lab tests required prior to treatment initiation:  Tuberculosis: Tuberculosis screening resulted in a non-reactive Quantiferon TB Gold assay.  Hepatitis B: Hepatitis B serology studies are complete and non-reactive.    Administration:     Prefilled auto-injector pen  1. Gather all supplies needed for injection on a clean, flat working surface: medication pen removed from packaging, alcohol swab, sharps container, etc.  2. Look at the medication label - look for correct medication, correct dose, and check the expiration date  3. Look at the medication - the liquid visible in the window on the side of the pen device should appear clear and colorless  4. Lay the auto-injector pen on a flat surface and allow it to warm up to room temperature for at least 30-45 minutes  5. Select injection site - you can use the front of your thigh or your belly (but not the area 2 inches around your belly button); if someone else is giving you the injection you can also use your upper arm in the skin covering your triceps muscle  6. Prepare injection site - wash your hands and clean the skin at the injection site with an alcohol swab and let it air dry, do not touch the injection site again before the injection  7. Pull the 2 safety caps straight off - gray/white to uncover the needle cover and the plum cap to uncover the plum activator button, do not remove until immediately prior to injection and do not touch the white needle cover  8. Gently squeeze the area of cleaned skin and hold it firmly to create a firm surface at the selected injection site  9. Put the white needle cover against your skin at the injection site at a 90 degree angle, hold the pen such that you can see the clear medication window  10. Press down and hold the pen firmly against your skin, press the plum activator button to initiate the injection, there will be a click when the injection starts  11. Continue to hold the pen firmly against your skin for about 10-15 seconds - the window will start to turn solid yellow  12. To verify the injection is complete after 10-15 seconds, look and ensure the window is solid yellow and then pull the pen away from your skin  13. Dispose of the used auto-injector pen immediately in your sharps disposal container the needle will be covered automatically  14. If you see any blood at the injection site, press a cotton ball or gauze on the site and maintain pressure until the bleeding stops, do not rub the injection site    Adherence/Missed dose instructions:  If your injection  is given more than 3 days after your scheduled injection date - consult your pharmacist for additional instructions on how to adjust your dosing schedule.    Goals of Therapy     - Reduce the frequency and severity of new lesions  - Minimize pain and suppuration  - Prevent disease progression and limit scarring  - Maintenance of effective psychosocial functioning    Side Effects & Monitoring Parameters     Injection site reaction (redness, irritation, inflammation localized to the site of administration)  Signs of a common cold - minor sore throat, runny or stuffy nose, etc.  Upset stomach  Headache    The following side effects should be reported to the provider:  Signs of a hypersensitivity reaction - rash; hives; itching; red, swollen, blistered, or peeling skin; wheezing; tightness in the chest or throat; difficulty breathing, swallowing, or talking; swelling of the mouth, face, lips, tongue, or throat; etc.  Reduced immune function - report signs of infection such as fever; chills; body aches; very bad sore throat; ear or sinus pain; cough; more sputum or change in color of sputum; pain with passing urine; wound that will not heal, etc.  Also at a slightly higher risk of some malignancies (mainly skin and blood cancers) due to this reduced immune function.  In the case of signs of infection - the patient should hold the next dose of Humira?? and call your primary care provider to ensure adequate medical care.  Treatment may be resumed when infection is treated and patient is asymptomatic.  Changes in skin - a new growth or lump that forms; changes in shape, size, or color of a previous mole or marking  Signs of unexplained bruising or bleeding - throwing up blood or emesis that looks like coffee grounds; black, tarry, or bloody stool; etc.  Signs of new or worsening heart failure - shortness of breath; sudden weight gain; heartbeat that is not normal; swelling in the arms or legs that is new or worse      Contraindications, Warnings, & Precautions     Have your bloodwork checked as you have been told by your prescriber  Talk with your doctor if you are pregnant, planning to become pregnant, or breastfeeding  Discuss the possible need for holding your dose(s) of Humira?? when a planned procedure is scheduled with the prescriber as it may delay healing/recovery timeline       Drug/Food Interactions     Medication list reviewed in Epic. The patient was instructed to inform the care team before taking any new medications or supplements. No drug interactions identified.   Talk with you prescriber or pharmacist before receiving any live vaccinations while taking this medication and after you stop taking it    Storage, Handling Precautions, & Disposal     Store this medication in the refrigerator.  Do not freeze  If needed, you may store at room temperature for up to 14 days  Store in original packaging, protected from light  Do not shake  Dispose of used syringes/pens in a sharps disposal container          Current Medications (including OTC/herbals), Comorbidities and Allergies     Current Outpatient Medications   Medication Sig Dispense Refill    adalimumab (HUMIRA,CF, PEN) 80 mg/0.8 mL PnKt Inject the contents of 2 pens (160 mg) under the skin on day 1, then inject 1 pen (80 mg) on day 15. Loading dose. 3 each 0    ADALIMUMAB PEN  CITRATE FREE 40 MG/0.4 ML Inject the contents of 1 pen (40 mg total) under the skin every seven (7) days. Maintenance dose. 4 each 11    amoxicillin-clavulanate (AUGMENTIN) 875-125 mg per tablet Take 1 tablet by mouth two (2) times a day. 60 tablet 2    clindamycin (CLEOCIN T) 1 % Swab Apply to affected areas 2-3 times weekly as maintenance and then twice daily for flares (Patient not taking: Reported on 02/23/2022) 60 each 5    dicyclomine (BENTYL) 20 mg tablet  (Patient not taking: Reported on 02/23/2022)      doxycycline (VIBRA-TABS) 100 MG tablet Take 1 tablet (100 mg total) by mouth two (2) times a day. For 2 weeks at a time for flares 60 tablet 1    HYDROcodone-acetaminophen (NORCO) 5-325 mg per tablet Take 1 tablet by mouth every six (6) hours as needed for pain. 15 tablet 0    ondansetron (ZOFRAN-ODT) 4 MG disintegrating tablet TAKE 1 TABLET BY MOUTH EVERY 6 HOURS AS NEEDED FOR NAUSEA (Patient not taking: Reported on 02/23/2022)       No current facility-administered medications for this visit.       Allergies   Allergen Reactions    Capsaicin Other (See Comments)     Burned patient skin  Burned patient skin  Burned patient skin         Patient Active Problem List   Diagnosis    Acne    Anxiety    Attention-deficit hyperactivity disorder, predominantly hyperactive type    BMI 27.0-27.9,adult    Depression    Family history of breast cancer in first degree relative    Insomnia    Generalized abdominal pain    Pneumomediastinum (CMS-HCC)    Panic attack as reaction to stress    Hidradenitis suppurativa of multiple sites    IBS (irritable bowel syndrome)    Weight gain    EIC (epidermal inclusion cyst)       Reviewed and up to date in Epic.    Appropriateness of Therapy     Acute infections noted within Epic:  No active infections  Patient reported infection: None    Is medication and dose appropriate based on diagnosis and infection status? Yes    Prescription has been clinically reviewed: Yes      Baseline Quality of Life Assessment      How many days over the past month did your HS  keep you from your normal activities? For example, brushing your teeth or getting up in the morning. 0    Financial Information     Medication Assistance provided: Prior Authorization and Copay Assistance    Anticipated copay of $5 reviewed with patient. Verified delivery address.    Delivery Information     Scheduled delivery date: 12/20    Expected start date: 12/20    Medication will be delivered via UPS to the prescription address in [facility].  This shipment will not require a signature.      Explained the services we provide at [facility] and that each month we would call to set up refills.  Stressed importance of returning phone calls so that we could ensure they receive their medications in time each month.  Informed patient that we should be setting up refills 7-10 days prior to when they will run out of medication.  A pharmacist will reach out to perform a clinical assessment periodically.  Informed patient that a welcome packet, containing information  about our pharmacy and other support services, a Notice of Privacy Practices, and a drug information handout will be sent.      The patient or caregiver noted above participated in the development of this care plan and knows that they can request review of or adjustments to the care plan at any time.      Patient or caregiver verbalized understanding of the above information as well as how to contact the pharmacy at [phone] option 4 with any questions/concerns.  The pharmacy is open Monday through Friday 8:30am-4:30pm.  A pharmacist is available 24/7 via pager to answer any clinical questions they may have.    Patient Specific Needs     Does the patient have any physical, cognitive, or cultural barriers? No    Does the patient have adequate living arrangements? (i.e. the ability to store and take their medication appropriately) Yes    Did you identify any home environmental safety or security hazards? No    Patient prefers to have medications discussed with  Patient     Is the patient or caregiver able to read and understand education materials at a high school level or above? Yes    Patient's primary language is  English     Is the patient high risk? No    SOCIAL DETERMINANTS OF HEALTH     At the [facility], we have learned that life circumstances - like trouble affording food, housing, utilities, or transportation can affect the health of many of our patients.   That is why we wanted to ask: are you currently experiencing any life circumstances that are negatively impacting your health and/or quality of life? Patient declined to answer    Social Determinants of Health     Financial Resource Strain: Not on file   Internet Connectivity: Not on file   Food Insecurity: Not on file   Tobacco Use: Medium Risk (02/23/2022)    Patient History     Smoking Tobacco Use: Former     Smokeless Tobacco Use: Never     Passive Exposure: Not on file   Housing/Utilities: Not on file   Alcohol Use: Not on file   Transportation Needs: Not on file   Substance Use: Not on file   Health Literacy: Not on file   Physical Activity: Not on file   Interpersonal Safety: Not on file   Stress: Not on file   Intimate Partner Violence: Not on file   Depression: Not at risk (05/25/2021)    PHQ-2     PHQ-2 Score: 0   Social Connections: Not on file       Would you be willing to receive help with any of the needs that you have identified today? Not applicable       Charnae Lill A Desiree Lucy [facility] Specialty Pharmacist

## 2022-03-23 NOTE — Unmapped (Signed)
[facility] Specialty Medication Onboarding    Specialty Medication: HUMIRA(CF) PEN 40 mg/0.4 mL injection (adalimumab)  Prior Authorization: Approved   Financial Assistance: Yes - copay card approved as secondary   Final Copay/Day Supply: $5 / 28 (LD)          $5 / 28 (MD)    Insurance Restrictions: Yes - max 1 month supply     Notes to Pharmacist: n/a    The triage team has completed the benefits investigation and has determined that the patient is able to fill this medication at [facility]. Please contact the patient to complete the onboarding or follow up with the prescribing physician as needed.

## 2022-03-27 MED FILL — EMPTY CONTAINER: 120 days supply | Qty: 1 | Fill #0

## 2022-03-28 DIAGNOSIS — L732 Hidradenitis suppurativa: Principal | ICD-10-CM

## 2022-03-28 MED ORDER — HUMIRA(CF) PEN 80 MG/0.8 ML SUBCUTANEOUS KIT
SUBCUTANEOUS | 0 refills | 0.00000 days | Status: CP
Start: 2022-03-28 — End: ?
  Filled 2022-04-24: qty 4, 28d supply, fill #0

## 2022-03-29 NOTE — Unmapped (Signed)
[facility] Clinical Intervention    Type of intervention: Medication administration    Medication involved: Humira    Problem identified: Aanchal called to report misfire of first loading dose pen. She reports pen was lifted too soon. Second pen injected without issue.    Intervention performed: I tried to run test claim but got rejection to fill maintenance dose now. I encouraged her to call Humira to report and I will message clinic to send one time 80 mg pen to [facility].    Follow-up needed: no    Approximate time spent: 10-15 minutes    Clinical evidence used to support intervention: Professional judgement    Result of the intervention: Improved medication adherence    Yeilin Zweber A Desiree Lucy [facility] Specialty Pharmacist

## 2022-04-11 NOTE — Unmapped (Signed)
Encounter not need, not a [place] internal med patient

## 2022-04-12 NOTE — Unmapped (Signed)
Deanna Flores was able to get her replacement dose from [facility] and plans to inject today. We reviewed her maintenance dosing will start on 1/18.    She's had only one minor flare since starting therapy and reports her HS wounds are healing where she had recent surgery.    [facility] Clinical Assessment & Refill Coordination Note    Deanna Flores, DOB: 1986-01-03  Phone: [phone] (home)     All above HIPAA information was verified with patient.     Was a Nurse, learning disability used for this call? No    Specialty Medication(s):   Inflammatory Disorders: Humira     Current Outpatient Medications   Medication Sig Dispense Refill    adalimumab (HUMIRA,CF, PEN) 80 mg/0.8 mL PnKt Inject the contents of 1 pen (80 mg) under the skin once 1 each 0    ADALIMUMAB PEN CITRATE FREE 40 MG/0.4 ML Inject the contents of 1 pen (40 mg total) under the skin every seven (7) days. Maintenance dose. 4 each 11    amoxicillin-clavulanate (AUGMENTIN) 875-125 mg per tablet Take 1 tablet by mouth two (2) times a day. 60 tablet 2    clindamycin (CLEOCIN T) 1 % Swab Apply to affected areas 2-3 times weekly as maintenance and then twice daily for flares (Patient not taking: Reported on 02/23/2022) 60 each 5    dicyclomine (BENTYL) 20 mg tablet  (Patient not taking: Reported on 02/23/2022)      doxycycline (VIBRA-TABS) 100 MG tablet Take 1 tablet (100 mg total) by mouth two (2) times a day. For 2 weeks at a time for flares 60 tablet 1    empty container Misc Use as directed to dispose of Humira pens. 1 each 2    HYDROcodone-acetaminophen (NORCO) 5-325 mg per tablet Take 1 tablet by mouth every six (6) hours as needed for pain. 15 tablet 0    ondansetron (ZOFRAN-ODT) 4 MG disintegrating tablet TAKE 1 TABLET BY MOUTH EVERY 6 HOURS AS NEEDED FOR NAUSEA (Patient not taking: Reported on 02/23/2022)       No current facility-administered medications for this visit.        Changes to medications: Kymber reports no changes at this time.    Allergies   Allergen Reactions    Capsaicin Other (See Comments)     Burned patient skin  Burned patient skin  Burned patient skin         Changes to allergies: No    SPECIALTY MEDICATION ADHERENCE     Humira - 1 left (80 mg)  Medication Adherence    Patient reported X missed doses in the last month: 0  Specialty Medication: Humira                            Specialty medication(s) dose(s) confirmed: Regimen is correct and unchanged.     Are there any concerns with adherence? No    Adherence counseling provided? Not needed    CLINICAL MANAGEMENT AND INTERVENTION      Clinical Benefit Assessment:    Do you feel the medicine is effective or helping your condition? Yes    Clinical Benefit counseling provided? Not needed    Adverse Effects Assessment:    Are you experiencing any side effects? No    Are you experiencing difficulty administering your medicine? No    Quality of Life Assessment:    Quality of Life    Rheumatology  Oncology  Dermatology  1. What impact has your specialty medication had on the symptoms of your skin condition (i.e. itchiness, soreness, stinging)?: Some  2. What impact has your specialty medication had on your comfort level with your skin?: Some  Cystic Fibrosis          How many days over the past month did your HS  keep you from your normal activities? For example, brushing your teeth or getting up in the morning. 0    Have you discussed this with your provider? Not needed    Acute Infection Status:    Acute infections noted within Epic:  No active infections  Patient reported infection: None    Therapy Appropriateness:    Is therapy appropriate and patient progressing towards therapeutic goals? Yes, therapy is appropriate and should be continued    DISEASE/MEDICATION-SPECIFIC INFORMATION      For patients on injectable medications: Patient currently has 1 doses left.  Next injection is scheduled for 1/4 - maintenance dosing to start on 1/18.    Chronic Inflammatory Diseases: Have you experienced any flares in the last month? Yes, minor and less severe than before- typical for this disease to have continued activity  Has this been reported to your provider? No    PATIENT SPECIFIC NEEDS     Does the patient have any physical, cognitive, or cultural barriers? No    Is the patient high risk? No    Did the patient require a clinical intervention? No    Does the patient require physician intervention or other additional services (i.e., nutrition, smoking cessation, social work)? No    SOCIAL DETERMINANTS OF HEALTH     At the [facility], we have learned that life circumstances - like trouble affording food, housing, utilities, or transportation can affect the health of many of our patients.   That is why we wanted to ask: are you currently experiencing any life circumstances that are negatively impacting your health and/or quality of life? Patient declined to answer    Social Determinants of Health     Financial Resource Strain: Not on file   Internet Connectivity: Not on file   Food Insecurity: Not on file   Tobacco Use: Medium Risk (02/23/2022)    Patient History     Smoking Tobacco Use: Former     Smokeless Tobacco Use: Never     Passive Exposure: Not on file   Housing/Utilities: Not on file   Alcohol Use: Not on file   Transportation Needs: Not on file   Substance Use: Not on file   Health Literacy: Not on file   Physical Activity: Not on file   Interpersonal Safety: Not on file   Stress: Not on file   Intimate Partner Violence: Not on file   Depression: Not at risk (05/25/2021)    PHQ-2     PHQ-2 Score: 0   Social Connections: Not on file       Would you be willing to receive help with any of the needs that you have identified today? Not applicable       SHIPPING     Specialty Medication(s) to be Shipped:   Inflammatory Disorders: Humira    Other medication(s) to be shipped: No additional medications requested for fill at this time     Changes to insurance: No    Delivery Scheduled: Yes, Expected medication delivery date: 1/16.     Medication will be delivered via UPS to the confirmed prescription address in [place].  The patient will receive a drug information handout for each medication shipped and additional FDA Medication Guides as required.  Verified that patient has previously received a Conservation officer, historic buildings and a Surveyor, mining.    The patient or caregiver noted above participated in the development of this care plan and knows that they can request review of or adjustments to the care plan at any time.      All of the patient's questions and concerns have been addressed.    Lanney Gins   [facility] Specialty Pharmacist

## 2022-04-18 NOTE — Unmapped (Signed)
Patient Deanna Flores was contacted today regarding rescheduling an appointment per bump list. Appointment rescheduled and confirmed with patient.

## 2022-04-24 NOTE — Unmapped (Signed)
Deanna Flores 's Humira shipment will be sent out  as a result of unsure [name] had to  put in some codes going forward pt will have to fill with [facility]     and communicated the delivery change. We will reschedule the medication for the delivery date that the patient agreed upon.  We have confirmed the delivery date as 01/17, via ups.

## 2022-04-27 DIAGNOSIS — L732 Hidradenitis suppurativa: Principal | ICD-10-CM

## 2022-04-27 MED ORDER — ADALIMUMAB PEN CITRATE FREE 40 MG/0.4 ML
SUBCUTANEOUS | 11 refills | 28.00000 days | Status: CP
Start: 2022-04-27 — End: ?

## 2022-04-27 NOTE — Unmapped (Signed)
The [facility] has received the prescription(s) for HUMIRA(CF) PEN 40 mg/0.4 mL injection (adalimumab). The triage team has completed the benefits investigation and has determined that the patient is NOT able to fill this medication at the [facility] due to insurance plan limitations. Please see additional information below and re-route the prescription to the preferred pharmacy. Thank you.    PA Required: No    Specialty Pharmacy Required:  [facility] - Phone: [phone] and Fax: [phone]

## 2022-05-09 ENCOUNTER — Ambulatory Visit: Admit: 2022-05-09 | Discharge: 2022-05-10 | Payer: PRIVATE HEALTH INSURANCE

## 2022-05-09 DIAGNOSIS — L732 Hidradenitis suppurativa: Principal | ICD-10-CM

## 2022-05-09 MED ORDER — ADALIMUMAB PEN CITRATE FREE 40 MG/0.4 ML
SUBCUTANEOUS | 11 refills | 28.00000 days | Status: CP
Start: 2022-05-09 — End: ?

## 2022-05-09 MED ADMIN — triamcinolone acetonide (KENALOG-40) injection 40 mg: 40 mg | INTRALESIONAL | @ 14:00:00 | Stop: 2022-05-09

## 2022-05-09 NOTE — Unmapped (Signed)
Dermatology Note    Assessment and Plan:      Recurrent inflammatory nodule on L chest (non-hidradenitis), favor inflamed epidermal inclusion cyst/abscess  S/p excision at [facility] ~2021 for dilated pore of Winer; has intermittently become inflamed with intermittent drainage since this procedure. DDx at 11/2021 visit included suture reaction vs furuncle - treated with intra-lesional kenalog X 1  - Surgical excision benign  trunk, arms, legs: 1.1-2cm  11402  scalp, trunk, arms, legs, axillae: 2.6 cm -7.5  12032     Hidradenitis Suppurativa (HS): Hurley stage II  Chronic condition with exacerbation or progression   Previously failed: Spironolactone (ADE: hives, pruritus)  - Discussed the chronic, relapsing nature of this skin disease, characterized by recurring inflamed painful nodules with abscess and sinus formation, and scarring. Explained to patient that usually early lesion can remit with medical treatment, but once sinus tracts are established treatment options are limited and surgery is the only definitive treatment; however, recurrence rate can be up to 25% even with surgery. Typically surgery is initiated after medical management has been optimized.  - Unroofing procedure on 03/13/22 to bilateral axilla     Plan:  - Continue humira 40 mg weekly  - Continue doxycycline 100 mg bid for short courses for flares  - Continue topical clindamycin 1% swabs to affected areas 2-3 times weekly and then twice daily for flares  - ILK as below  - Continue laser hair removal axilla    NdYAG Treatment number: 2  After verbal consent was obtained with review of possible adverse effects such as temporary or permanent pigment alteration, scarring, crusting, blistering, infection, and discomfort and appropriate eye protection was applied to the patient, the areas indicated above were treated with the following settings:    Indication: Hidradenitis suppurativa and associated hypertrophic scar with impairment of physical function and severe pain flaring  Physician operator(s): Ardith Dark, MD, Leonette Nutting, MD  Wavelength: 1064 nm Nd:YAG laser  Spot size: 24 mm  Fluence: 12J/cm??  Pulse duration: 30ms  Cooling: cyrotherapy on and Zimmer cooler used during treatment  Location: bilateral axilla  BSA/number of pulses: Greater than 15 follicles and associated scar destroyed    Intralesional Kenalog Procedure Note: After the patient was informed of risks (including atrophy and dyspigmentation), benefits and side effects of intralesional steroid injection, the patient elected to undergo injection and verbal consent was obtained. Skin was cleaned with alcohol and injected intralesionally into the sites (below). The patient tolerated the procedure well without complications and was instructed on post-procedure care.  Location(s): bilateral axilla  Number of sites treated: 2  Kenalog (triamcinolone) Concentration: 40 mg/ml   Volume: 0.4 ml total    High Risk Medication Use (Humira)  - 03/2022: negative quantiferon gold, hepatitis serologies    The patient was advised to call for an appointment should any new, changing, or symptomatic lesions develop.     RTC: Return for For laser hair as able, surgery Rosalita Chessman to schedule. or sooner as needed   _________________________________________________________________      Chief Complaint     Chief Complaint   Patient presents with    Skin Check     Follow up on surgery    Laser Treatment     Bilateral axilla       HPI     Deanna Flores is a 37 y.o. female who presents as a returning patient (last seen 03/13/2022) for follow up of hidradenitis. She is also here for laser hair  removal to the bilateral axilla. Started humira at last visit. Reports doing about the same as before. Areas of surgery healing well but two inflammatory nodules, each underarm. Taking doxycycline for flares.    Self-reported severity (0-5): 2  VAS pain today: 3  VAS average pain for the last month: 2  Requiring pain medication? No.  If so, what type/frequency? NA  How often in pain?  few times a month  Level of odor (0-5): 0  Level of itching (0-5): 4  Dressing changes needed for drainage:No drainage/less than once weekly  How much drainage: no drainage  Flare in the last month (Y/N)? No.  How long ago was the last flare? in last 6 months  Developing new lesions? once a month  Number of inflammatory lesions montly: 1-3  DLQI: 9  Current treatment:Laser, humira       The patient denies any other new or changing lesions or areas of concern.     Pertinent Past Medical History     Past Medical History, Family History, Social History, Medication List, Allergies, and Problem List were reviewed in the rooming section of Epic.     ROS: Other than symptoms mentioned in the HPI, no fevers, chills, or other skin complaints    Physical Examination     GENERAL: Well-appearing female in no acute distress, resting comfortably.  NEURO: Alert and oriented, answers questions appropriately  PSYCH: Normal mood and affect  EYES: lids clear, conjunctiva pink, no scleral icterus or other color change  RESP: No increased work of breathing  SKIN (Waist Up Skin Exam): Per patient request, exam of the scalp, face, eyelids, lips, nose, ears, neck, chest, upper abdomen, back, arms, hands, and fingernails was performed  -L axilla 1 IN about 2cm inferior to surgical scar, R axilla IN at tip of scar    All areas not commented on are within normal limits or unremarkable

## 2022-05-09 NOTE — Unmapped (Deleted)
Self-reported severity (0-5): 2  VAS pain today: 3  VAS average pain for the last month: 2  Requiring pain medication? No.  If so, what type/frequency? ***  How often in pain?  few times a month  Level of odor (0-5): 0  Level of itching (0-5): 4  Dressing changes needed for drainage:No drainage/less than once weekly  How much drainage: no drainage  Flare in the last month (Y/N)? No.  How long ago was the last flare? in last 6 months  Developing new lesions? once a month  Number of inflammatory lesions montly: 1-3  DLQI: 9  Current treatment:Laser     How helpful is the current treatment in managing the following aspects of your disease?  Not at all helpful Somewhat helpful Very helpful   Pain      Decreasing length of flares      Decreasing new lesions      Drainage      Decreasing frequency of flares      Decreasing severity of flares      Odor

## 2022-05-10 NOTE — Unmapped (Signed)
I saw and evaluated the patient, participating in the key elements of the service.  I discussed the findings, assessment and plan with the resident and agree with resident’s findings and plan as documented in the resident's note.  I was immediately available for the entirety of the procedure(s) and present for the key and critical portions. Cari Vandeberg J Sussan Meter, MD

## 2022-06-14 DIAGNOSIS — L732 Hidradenitis suppurativa: Principal | ICD-10-CM

## 2022-06-14 MED ORDER — HUMIRA PEN CITRATE FREE 40 MG/0.4 ML
SUBCUTANEOUS | 11 refills | 28.00000 days
Start: 2022-06-14 — End: ?

## 2022-06-19 ENCOUNTER — Ambulatory Visit: Admit: 2022-06-19 | Discharge: 2022-06-20 | Payer: PRIVATE HEALTH INSURANCE

## 2022-06-19 DIAGNOSIS — Z803 Family history of malignant neoplasm of breast: Principal | ICD-10-CM

## 2022-06-19 NOTE — Unmapped (Signed)
Patient Name: Deanna Flores  Patient Age: 37 y.o.  Encounter Date: 06/19/2022    Referring Physician:   Oneita Hurt None Per Patient  [address] [place],  [place] [address]    Primary Care Provider:  Jacquelynn Cree, MD    Supervising Physician:  Dr. Debbrah Alar    Diagnosis:  1. Family history of breast cancer in first degree relative       Cancer Staging   No matching staging information was found for the patient.      Follow Up Note:    Deanna Flores is a 37 y.o. female who is seen here for routine follow-up in high risk clinic.  I saw the patient last in September she had a right breast biopsy that revealed benign breast tissue with stromal fibrosis.  This was concordant and 55-month follow-up recommended.  I saw her in September she had imaging studies that identified the lesion that was previously biopsied is smaller and likely benign mass 1 o'clock position which they recommended a 13-month follow-up.  Patient had imaging studies this morning prior to seeing me which unfortunately revealed right breast 1 o'clock position previously biopsied benign mass is now measuring 5.1 x 1.3 x 4.3 cm increased in size from previously measured 3.6 x 1.4 x 3.4.  The patient comes to the clinic post imaging and is anxious and crying.  Talked about the fact that we will likely surgically remove this but that I would recommend we do 1 more core biopsy to ensure histology is the same since it would change the surgical approach.  Patient has been followed by clinic in the past because of family history of breast cancer.    Review of Systems:  Is notable for moderate degree of anxiety.  Remaining 10 systems are negative    Changes in self breast exam:   None per patient    PMH:   Past Medical History:   Diagnosis Date    Acne     Allergic     Anxiety     Disorder of skin or subcutaneous tissue     IBS (irritable bowel syndrome)        PSH:  Past Surgical History:   Procedure Laterality Date    BREAST BIOPSY Right 06/2021    b9       Family History:  Family History   Problem Relation Age of Onset    Breast cancer Mother 49    Cancer Mother         Breast cancer    Rashes / Skin problems Mother         Swollen lymphmodes    No Known Problems Father     No Known Problems Sister     No Known Problems Daughter     No Known Problems Maternal Grandmother     No Known Problems Maternal Grandfather     Bone cancer Paternal Grandmother     No Known Problems Paternal Grandfather     Breast cancer Maternal Aunt         great (mothers father)    No Known Problems Other     Colon cancer Neg Hx     Endometrial cancer Neg Hx     Ovarian cancer Neg Hx     Melanoma Neg Hx     Basal cell carcinoma Neg Hx     Squamous cell carcinoma Neg Hx     BRCA 1/2 Neg Hx  Exam:  BSA: There is no height or weight on file to calculate BSA.  LMP 05/23/2022   Pain Assessment: Pain scale    General Appearance:  No acute distress, well appearing and well nourished. A&0 x 3.   HEENT:  Conjuctiva and lids appear normal. Pupils equal and round,   sclera anicteric. Neck is supple. Trachea midline.  No JVD or supraclavicular adenopathy.    Breast: Breast exam reveals large breast with everted nipples without skin change.  In the 12 1 o'clock position I can palpate 4-1/2 cm mass within surrounding dense breast tissue.   Axilla: No adenopathy in the axilla bilateral   Pulmonary:    Normal respiratory effort.     Cardiovascular:  Regular rate and rhythm.   Neurologic:  Lymphatic: No motor abnormalities noted.  Sensation grossly intact.  No cervical or supraclavicular LAD noted.     Diagnostic Studies:  @MAMMOFINDINGS @  Per HPI    Assessment:  Family history of breast cancer    Right breast mass increased in size still considered BI-RADS 4A    Plan:  Orders have been placed for ultrasound-guided core biopsy of this mass.  The 13th at 11 AM.  She will be called with those results will discuss on the phone and then plan to return to the clinic to meet Dr. Dellis Anes to discuss and arrange for excisional biopsy.              The note was transcribed with Dragon and therefore may have errors in spelling

## 2022-06-20 ENCOUNTER — Ambulatory Visit: Admit: 2022-06-20 | Discharge: 2022-06-21 | Payer: PRIVATE HEALTH INSURANCE

## 2022-06-20 DIAGNOSIS — Z803 Family history of malignant neoplasm of breast: Principal | ICD-10-CM

## 2022-06-20 MED ORDER — HUMIRA PEN CITRATE FREE 40 MG/0.4 ML
SUBCUTANEOUS | 11 refills | 28.00000 days | Status: CP
Start: 2022-06-20 — End: ?

## 2022-06-27 NOTE — Unmapped (Signed)
Hi,     Hendrix contacted the Communication Center regarding the following:    - States that she would like to discuss her results and next steps     Please contact Manali at [phone].    Thanks in advance,    Iona Hansen  [facility]   [phone]

## 2022-06-27 NOTE — Unmapped (Signed)
[facility] Triage Note    Reason for call: Return call    Time for incident: 1241 - 1256    Last seen in clinic on 06/19/22     Phone Assessment: Spoke with pt regarding call request.  Pt has seen biopsy results - negative for malignancy; however, per last clinic visit, she was told surgery may be necessary even with negative results.  She would like to speak to provider for follow up on next steps.  She also will need assistance with FMLA once plan in place.     Triage Recommendations: Msg forwarded to care team for recommendations.     Caller's Response: Thank you so much.     Outstanding tasks: Care team notified, no further action needed.

## 2022-06-27 NOTE — Unmapped (Signed)
Patient Deanna Flores was contacted today regarding scheduling an appointment. Appointment scheduled and confirmed with patient.

## 2022-06-28 ENCOUNTER — Ambulatory Visit: Admit: 2022-06-28 | Discharge: 2022-06-29 | Payer: PRIVATE HEALTH INSURANCE

## 2022-06-28 LAB — HEMOGLOBIN A1C
ESTIMATED AVERAGE GLUCOSE: 123 mg/dL
HEMOGLOBIN A1C: 5.9 % — ABNORMAL HIGH (ref 4.8–5.6)

## 2022-06-28 NOTE — Unmapped (Signed)
Chief concern/reason for visit: weight management     Assessment/Plan:     Class 1 obesity with body mass index (BMI) of 30.0 to 30.9 in adult, unspecified obesity type, unspecified whether serious comorbidity present  IBS without worsening symptoms  Regular Periods  Given that Deanna Flores is not having any worsening GI or pelvic symptoms likely weight is related to diet/exercise. Discussed previous pre-diabetes diagnosis and recommended can try checking hemoglobin A1c and start metformin if still pre-diabetic. Discussed that this medication can help with weight loss. Discussed lifestyle modifications. Pt interested in metformin and additionally interested in a nutrition referral. Additionally recommended returning to clinic if having any concerning abdominal symptoms.  - Hemoglobin A1c; Future  - Ambulatory referral to Nutrition Services; Future    Depression, unspecified depression type  Recommended connecting to the therapist. Pt interested in hearing about more resources than [email or web address] from social work. Will reach out on chart close.    Follow up in 2-3 months    Subjective     HPI:  #breast mass  - plan for excision because oncologist still recommended having mass removed even if negative after core biopsy    #goal weight loss  - feels like has abdominal bloating  - feels like stomach is protruding in an unusual way  - interested in options to help lose weight  - reports a history of IBS  - certain not pregnancy has not had sex in over a year  - doing cardio, typical session: 30 minutes on the treadmill, 15 minute elliptical, 15 on bike 3-4 times a week  - trying to stay away from fried foods and red meat  - working on getting a therapist (has a balance at old therapist)    Wt Readings from Last 25 Encounters:   06/28/22 80.8 kg (178 lb 3.2 oz)   06/19/22 82.7 kg (182 lb 4.8 oz)   02/23/22 79.8 kg (176 lb)   02/19/22 80.9 kg (178 lb 6.4 oz)   02/16/22 79.8 kg (176 lb)   01/15/22 80.3 kg (177 lb)   12/14/21 82.6 kg (182 lb 3.2 oz)   05/25/21 81.6 kg (179 lb 14.4 oz)     #IBS  - overall has been eating better  - feels like gets acid reflux, typically if eats something bad  - gets some stomach cramping or pain  - about 30 minutes after eating will have to go to the bathroom  - will have bloating  - denies blood in poop    #periods  - last 5 days, regular  - lighter periods than have previously  - does not spot in-between periods  - no longer having as significant of cramping      Objective     Vitals:    06/28/22 1508   BP: 118/95   Pulse: 70   Temp: 36.6 ??C (97.9 ??F)     Physical Exam:    General Apperance: no acute distress  Respiratory: normal respiratory effort  Neurologic: normal gait, normal speech  GI: Soft, nontender, no rebound, no guarding  Psychiatric: appropriate affect   Skin: normal color    Deanna Faster, MD PGY-1

## 2022-06-29 NOTE — Unmapped (Signed)
CM provided patient with therapy resources through [place].         [facility]ourn[facility]   [facility]   [phone]

## 2022-07-02 NOTE — Unmapped (Signed)
Hi,     Signe contacted the Communication Center requesting to speak with the care team of Alee M Milich to discuss:    -Calling to speak to Alinda Dooms in regards to the FMLA Paperwork that was faxed on 06/28/22    Please contact Oma  at [phone] .        Thank you,   Warnell Bureau  [facility]   [phone]

## 2022-07-02 NOTE — Unmapped (Signed)
[facility] Triage Note    Reason for call: Return call    Time for incident: 1259 - 1305    Last seen in clinic on 06/19/22  Next clinic visit scheduled for 07/09/22     Phone Assessment: Left msg on v/m.     Triage Recommendations: Left msg on pt's v/m w/nature of my call. Phone # provided for return call. Msg forwarded to care team for recommendations.     Caller's Response: N/A     Outstanding tasks: Care team notified, no further action needed.

## 2022-07-04 NOTE — Unmapped (Signed)
Received two voice mails and a My Chart message from patient requesting FMLA to be completed asap secondary to her HR requesting the paperwork to cover three previous appointments.     Called patient and identified self and purpose of the call.  Patient relayed her HR has requested the paperwork to completed asap secondary to having multiple appointments related to the current diagnosis.      Informed patient NN was unable to add in the surgical continuous leave as she will see the attending on 07/09/22.  At surgical consultation appointment, she will know her surgery date and NN would be able to complete the paperwork in its entirety.     Patient informed NN she will request an extension as the current paperwork has a due date on 07/08/2022.  She will bring an additional copy to her appointment.     Patient was appreciative of the time.

## 2022-07-05 NOTE — Unmapped (Signed)
Hidradenitis Suppurativa   - Given history of recurrent inflammatory lesions and scarring,  patient is an appropriate candidate for laser therapy and this is medically necessary, at least for a trial of 4-6 treatments  - Patient has noticed decreased hair and severity of flares, especially in combination with the humira.   - Discussed risks and expectations and since patient has tolerated past treatments well, we will continue with further treatment today as below      Laser Procedure Note, Treatment #3  Indication for Treatment: hidradenitis suppurativa with associated pain, drainage, scarring, and physical disability when flaring   Location: bilateral axillae   Laser Used: Nd:YAG  Treatment Number: #3    Procedure:  - Verbal consent was obtained with review of possible adverse outcomes including pigment changes, bruising, discomfort, crusting, blistering, and scarring. After appropriate eye protection was applied to the patient, the areas indicated above were treated with the following settings:    Fluence: 12 J/cm2  Pulse length: 40 ms  Spot Size: 24 mm  Cryo: Yes  Size Treated: >50cm2, >15 hair follicles destroyed    The patient tolerated the procedure well and was instructed on post-procedure care and expectations.    The patient was advised to call for an appointment should any new, changing, or symptomatic lesions develop.     Next treatment in 6-8 weeks

## 2022-07-06 ENCOUNTER — Ambulatory Visit
Admit: 2022-07-06 | Discharge: 2022-07-07 | Payer: PRIVATE HEALTH INSURANCE | Attending: Student in an Organized Health Care Education/Training Program | Primary: Student in an Organized Health Care Education/Training Program

## 2022-07-06 DIAGNOSIS — L732 Hidradenitis suppurativa: Principal | ICD-10-CM

## 2022-07-09 ENCOUNTER — Ambulatory Visit: Admit: 2022-07-09 | Discharge: 2022-07-10 | Payer: PRIVATE HEALTH INSURANCE

## 2022-07-09 DIAGNOSIS — D241 Benign neoplasm of right breast: Principal | ICD-10-CM

## 2022-07-09 NOTE — Unmapped (Signed)
Patient Name: Deanna Flores  Patient Age: 37 y.o.  Encounter Date: 07/09/2022    Referring Physician:   Jacquelynn Cree, MD  [address]  [place],  [place] [address]    Primary Care Provider:  Jacquelynn Cree, MD      Reason for Visit: Newly diagnosed right breast hyalinized fibroadenoma; followed in the high risk clinic for FH of breast cancer; interested in excisional biopsy    HPI  Deanna Flores is a 37 y.o. female who is seen in consultation at the request of Jacquelynn Cree for evaluation of a newly diagnosed hyalinized fibroadenoma of the right breast. She is accompanied today by her mother, Deanna Flores who also contributes to the history. She reports this was discovered on self-exam approximately 1 year ago. Deanna Flores underwent diagnostic imaging, on 05/25/2021, which revealed a superficial predominantly anechoic mass with a tract extending to the skin surface measuring 1.3 x 0.9 x 0.8 cm. Contralateral imaging of the left axillary region, performed per patient request, demonstrates similar appearing superficial predominantly anechoic masses measuring 1.9 x 1.3 x 0.3 cm and 1.6 x 1.0 x 0.5 cm with adjacent subcutaneous edema and suggestive tracks to the skin surface. Targeted ultrasound of the right breast at 1:00 6 CFN demonstrates a heterogeneous mass measuring 4.3 x 3.7 x 1.3 cm. Subsequent biopsy, on 06/09/2021, of 1 o'clock 6 CFN lesion revealed benign breast tissue with stromal fibrosis and unremarkable lobular units. Deanna Flores underwent repeat diagnostic imaging, on 12/14/2021, which revealed a stable to slightly smaller previously biopsied circumscribed heterogeneous mixed hypoechoic and isoechoic parallel mass with some internal vascularity measuring 3.6 x 1.4 x 3.4 cm in the right breast at the 1 o'clock 6 CFN position. She was recommended a short interval follow up of 6 months. Deanna Flores underwent repeat imaging, on 06/19/2022, which revealed a 5.1 cm x 1.3 x 4.3 cm oval, circumscribed, heterogeneous and parallel mass at the 1 o'clock 6 CFN right breast. The mass previously measured 3.6 x 1.4 x 3.4 cm. Subsequent biopsy of 1 o'clock right breast lesion reveals hyalinized fibroadenoma. No atypia or malignancy identified.     Today, Deanna Flores is doing well. She was last seen in clinic by Lowry Bowl, NP on 06/19/2022 where she expressed desire to have the right breast mass excised. We discussed the management of fibroadenoma including serial ultrasound, biopsy and excision. Additionally, we discussed the indications for excision including large size (>3cm), rapid growth, and symptoms (e.g. pain, anxiety) interfering with activities of daily living. Given her significant family history, I recommend excisional breast biopsy which she desires. She denies associated symptoms including pain, redness, fever, chills, breast mass, breast retraction, nipple inversion, breast swelling, nipple discharge, nausea, vomiting, or weight loss.    FH: Mother diagnosed with breast cancer at age 49.    Clinical Trial Participant:   No clinical trials at this time.    Medical history reviewed as below. I have also reviewed additional records as provided.  Hematology/Oncology History    No history exists.     Past Medical History:   Diagnosis Date    Acne     Allergic     Anxiety     Disorder of skin or subcutaneous tissue     IBS (irritable bowel syndrome)      Past Surgical History:   Procedure Laterality Date    BREAST BIOPSY Right 06/2021    b9     Family History   Problem Relation Age of Onset  Breast cancer Mother 105    Cancer Mother         Breast cancer    Rashes / Skin problems Mother         Swollen lymphmodes    No Known Problems Father     No Known Problems Sister     No Known Problems Daughter     No Known Problems Maternal Grandmother     No Known Problems Maternal Grandfather     Bone cancer Paternal Grandmother     No Known Problems Paternal Grandfather     Breast cancer Maternal Aunt         great (mothers father)    No Known Problems Other Colon cancer Neg Hx     Endometrial cancer Neg Hx     Ovarian cancer Neg Hx     Melanoma Neg Hx     Basal cell carcinoma Neg Hx     Squamous cell carcinoma Neg Hx     BRCA 1/2 Neg Hx      Social History     Socioeconomic History    Marital status: Single   Tobacco Use    Smoking status: Former     Types: e-Cigarettes     Quit date: 11/2021     Years since quitting: 0.6    Smokeless tobacco: Never   Vaping Use    Vaping status: Never Used   Substance and Sexual Activity    Alcohol use: Not Currently     Alcohol/week: 3.0 standard drinks of alcohol     Types: 1 Glasses of wine, 1 Cans of beer, 1 Shots of liquor per week    Drug use: Yes     Frequency: 3.0 times per week     Types: Marijuana    Sexual activity: Not Currently     Partners: Male     Birth control/protection: Abstinence   Other Topics Concern    Do you use sunscreen? No    Tanning bed use? No    Are you easily burned? No    Excessive sun exposure? No    Blistering sunburns? No     Social Determinants of Health     Financial Resource Strain: Low Risk  (06/27/2022)    Overall Financial Resource Strain (CARDIA)     Difficulty of Paying Living Expenses: Not very hard   Food Insecurity: No Food Insecurity (06/27/2022)    Hunger Vital Sign     Worried About Running Out of Food in the Last Year: Never true     Ran Out of Food in the Last Year: Never true   Transportation Needs: No Transportation Needs (06/27/2022)    PRAPARE - Therapist, art (Medical): No     Lack of Transportation (Non-Medical): No       Current Outpatient Medications:     DENTA 5000 PLUS 1.1 % Crea, Apply 1 application. to teeth in the morning., Disp: , Rfl:     dicyclomine (BENTYL) 20 mg tablet, Take 1 tablet (20 mg total) by mouth., Disp: , Rfl:     doxycycline (VIBRA-TABS) 100 MG tablet, Take 1 tablet (100 mg total) by mouth two (2) times a day. For 2 weeks at a time for flares, Disp: 60 tablet, Rfl: 1    HUMIRA PEN CITRATE FREE 40 MG/0.4 ML, Inject the contents of 1 pen (40 mg total) under the skin every seven (7) days. Maintenance dose., Disp: 4 each, Rfl: 11  ondansetron (ZOFRAN-ODT) 4 MG disintegrating tablet, , Disp: , Rfl:   Allergies   Allergen Reactions    Capsaicin Other (See Comments)     Burned patient skin           Reproductive History:  G1P1 with first pregnancy at age 89. Menarche reported at age 36. LMP Patient's last menstrual period was mid March OCPs no. HRT no.    Review of Systems   10 organ systems reviewed and pertinent as noted in HPI.       Physical Examination:   Blood pressure 117/87, pulse 53, temperature 36.6 ??C (97.8 ??F), temperature source Temporal, resp. rate 18, weight 81.3 kg (179 lb 4.8 oz), last menstrual period 05/23/2022, SpO2 100%.  General Appearance:  No acute distress, well appearing and well nourished.   Head:  Normocephalic, atraumatic.   Eyes:  Conjuctiva and lids appear normal. Pupils equal and round,   sclera anicteric.   Ears:  Overall appearance normal with no scars, lesions or               masses.  Hearing is grossly normal.   Nose: Nares grossly normal, no drainage.   Throat: Lips, mucosa, and tongue normal; teeth and gums normal.   Neck: Supple, symmetrical, trachea midline, no adenopathy;   thyroid:  no enlargement/tenderness/nodules.   Pulmonary:    Normal respiratory effort.  Lungs were clear to auscultation  bilaterally.   Cardiovascular:  Regular rate and rhythm, no murmur noted.  No thrill noted.   Abdomen:   Soft, non-tender, without masses.  No                                      hepatosplenomegaly. No hernias appreciated. Normoactive bowel sounds.   Musculoskeletal: Normal gait.  Extremities without clubbing, cyanosis, or           edema.   Skin: Skin color, texture, turgor normal, no rashes or lesions.   Neurologic: No motor abnormalities noted.  Sensation grossly intact.   Lymphatic:  Breast: No cervical or supraclavicular LAD noted.   A comprehensive examination of the breasts and chest wall was performed with the patient upright and supine with arms at her sides and above her head.   Right breast normal in size, normal symmetry, normal contour, no dimpling, no skin changes, nipple everted, nipple exhibits no crusting or excoriation, no masses or nodules, no palpable axillary adenopathy.  Left breast normal in size, normal symmetry, normal contour, no dimpling, no skin changes, nipple everted, nipple exhibits no crusting or excoriation, no masses or nodules, no palpable axillary adenopathy.   Psychiatric: Judgement and insight appropriate.  Oriented to person,         place, and time.       Diagnostic Studies:   I personally reviewed all diagnostic imaging, including diagnostic mammogram with spot compression/magnification views, tomosynthesis, and ultrasound.    Diagnostic mammography/ultrasound, performed on 06/19/2022 at [facility], reveals previously sampled mass in the right breast at the 1 o'clock position containing a wing biopsy clip, appears more prominent. The rest of the right breast is unremarkable. No suspicious mass, architectural distortion or malignant-type microcalcifications are seen in the left breast. Targeted right breast ultrasound: In the right breast at the 1 o'clock position, 6 cm from the nipple, the previously biopsied benign (x1 sample since the patient could not tolerate further sampling) is a 5.1 cm x 1.3  x 4.3 cm oval, circumscribed, heterogeneous and parallel mass. The mass previously measured 3.6 x 1.4 x 3.4 cm.    Diagnostic mammography/ultrasound, performed on 12/14/2021 at [facility], reveals a stable oval low-density mass in the right breast at 1:00 containing a wing biopsy marker clip. No new suspicious masses, calcifications, or architectural distortion within the right breast. There are mildly prominent right axillary lymph nodes similar to the prior 05/25/2021 mammogram in consistent with the patient's history of hidradenitis suppurativa. Targeted ultrasound of the right breast was performed at 1:00 for follow-up. In the right breast at 1:00, 6 cm from nipple, there is a stable to slightly smaller previously biopsied circumscribed heterogeneous mixed hypoechoic and isoechoic parallel mass with some internal vascularity measuring 3.6 x 1.4 x 3.4 cm. Given only one core biopsy was obtained secondary to patient's significant discomfort, this mass continues to be considered probably benign.    Diagnostic mammography/ultrasound, performed on 05/25/2021 at [facility], reveals A BB marks the site of focal concern in the right breast. There is minimal skin thickening with subcutaneous fat stranding at the BB marker. Additionally, there is a circumscribed oval isodense mass in the upper inner right breast middle depth. There are no suspicious masses, malignant calcifications, sites of non-surgical architectural distortion, or asymmetries in the left breast. Targeted ultrasound was performed of the area of concern in the right axilla which demonstrates a superficial predominantly anechoic mass with a tract extending to the skin surface measuring 1.3 x 0.9 x 0.8 cm. Contralateral imaging of the left axillary region, performed per patient request, demonstrates similar appearing superficial predominantly anechoic masses measuring 1.9 x 1.3 x 0.3 cm and 1.6 x 1.0 x 0.5 cm with adjacent subcutaneous edema and suggestive tracks to the skin surface. Targeted ultrasound of the right breast at 1:00 6 cm from the nipple demonstrates a heterogeneous mass measuring 4.3 x 3.7 x 1.3 cm.      Biopsy/Pathology Review:  Stereotactic core needle biopsy was performed on 06/20/2022 at [facility] of the 1 o'clock lesion of the right breast, which revealed Hyalinized fibroadenoma. No atypia or malignancy identified.    Ultrasound guided core needle biopsy was performed on 06/09/2021 at [facility] of the 1 o'clock lesion of the right breast, which revealed Benign breast tissue with stromal fibrosis and unremarkable lobular units.       I have personally reviewed the breast imaging, laboratory values, existing medical records, and pathology. I have summarized these findings and my interpretation in the oncology history and assessment above.    Assessment/Plan:  Ms. Geurin is a 36 y.o. female with Hyalinized fibroadenoma of the right breast.  After reviewing all available records and imaging, we had a frank and thorough discussion regarding her course of care. Because of the risk of occult cancer associated with this lesion, I would recommend excisional breast biopsy. The risks and benefits of this treatment plan were discussed in detail.    We specifically discussed:  That this lesion is nonpalpable and will require image guided localization.  The risks of excisional biopsy include but are not limited to bleeding, infection, inadequate surgical margins, sampling error with false results, unsightly scar, chronic pain, collateral damage and imponderables to include, but not limited to disability or death.  The management of fibroadenoma including serial ultrasound, biopsy and excision  The indications for excision including large size (>3cm), rapid growth, and symptoms (e.g. pain, anxiety) interfering with activities of daily living  The possibility that a presumed fibroadenoma could be a Phyllodes tumor  and the relative likelihood considering her age and imaging.  That she will be scheduled for right [facility] localized excisional breast biopsy, tentatively on April 18th, 2024. Consent obtained and orders placed in EPIC.    All of the patient's and family's questions and concerns were addressed during the visit and they are in agreement with the plan of care. Please do not hesitate to contact me with questions or concerns.    Scribe's Attestation: Sherilyn Cooter, DO and Sonda Primes, NP obtained and performed the history, physical exam and medical decision making elements that were  entered into the chart. Documentation assistance was provided by me personally, a scribe. Signed by Jolene Schimke Scribe, on July 09, 2022 at 10:12 AM.     ----------------------------------------------------------------------------------------------------------------------  July 17, 2022 11:57 PM. Documentation assistance provided by the Scribe. I was present during the time the encounter was recorded. The information recorded by the Scribe was done at my direction and has been reviewed and validated by me.  ----------------------------------------------------------------------------------------------------------------------        Sonda Primes, AGNP-C  Surgical Breast Oncology  07/09/2022  10:13 AM

## 2022-07-09 NOTE — Unmapped (Signed)
Met with patient who is known to NN and discussed her FMLA paper work.  Patient informed NN she requested a two week extension for the paperwork due date.     Patient started her treatment at [facility] on 06/19/2022 and will incorporate all [facility] appointments including surgery and post-operative date.     Provided business card for future correspondence.  Patient was appreciative of the time.

## 2022-07-09 NOTE — Unmapped (Signed)
It was a pleasure to see you today in the Breast Surgical Oncology Clinic. Please call my nurse navigator, Alinda Dooms, if you have any interval questions or concerns.     Below, you will find details pertaining to your procedure.  Please note that surgical time is not finalized until the day before surgery. The OR staff will call you to finalize this.    You are scheduled for a right [facility] localized excisional breast biopsy tentatively on Thursday, April 18th, 2024. This date is always subject to change, so make sure we have a good working phone number to contact you in case this date changes. The OR staff will contact you by 4pm the day before your surgery date to let you know your expected surgery time. Be sure to bring a book or something to keep you occupied during the day as the surgery time may be earlier or later than the expected time. The pre-care clinic number is [phone] if you have any questions.    Based on a new federal law, you will be provided with your pathology and/or radiology results once they are finalized. Meaning, you will likely receive these results before I have had a chance to review them independently. We will schedule a follow up appointment with you about a week to two weeks after your surgery to go over the pathology result with you in person. If you feel that you may not understand or be worried or confused by your results, we ask you please not review them until your follow up clinic appointment.     Here is what you can expect following a lymph node procedure:  You may experience some bruising and pain. You will be given prescription pain medication after the procedure to use as needed for moderate to severe pain. This medication usually is a combination of a narcotic and tylenol (acetaminophen). This is important because you should not take additional tylenol while taking the prescription pain medication.    If you experience mild pain, you may take Tylenol (acetaminophen) or ibuprofen for pain relief, but take as recommended on the label. Remember that your prescription narcotic has tylenol in it, so make sure that you do not take the prescription medication in addition to tylenol.    You may shower as normal. No tub baths as your incision will be covered with skin glue. Do not place any lotions, ointments or creams as this can dissolve the glue. The glue will flake off on its own. Do not actively peel it from the skin.    You may have a drain after your surgery. If a drain is needed, the nurses will educate you on drain care. You can shower with them and pat the area dry afterwards. You will also be given antibiotics to take as long as the drains are in place.    You should be able to resume your normal daily activities.  However, you may want to refrain from activities such as aerobics, running, boat riding, or repetitive arm motions such as cooking, cleaning, gardening, etc. for 7-10 days to minimize the amount of pain you may experience.  We recommend that you wear a snug fitting bra for 24-hours-a-day at least for the first 2-3 days, to reduce swelling and bruising and for your added comfort.    If any bleeding from the site resumes, hold pressure for 15 minutes.  If this does not help or if other problems occur, please call.  If you have any questions or need anything from the surgical oncology department, here are some additional numbers.    Surgical oncology contact information:  For appointments, please call [phone].     Nurse Navigator available Monday through Friday 8:30 am to 4:30 pm:   Alinda Dooms, RN:    Phone: [phone]   Fax: [phone] attention Alinda Dooms    For emergencies, evenings or weekends, please call [phone] and ask for the surgical oncology resident on call. Please be aware that this person is also responding to in-hospital emergencies and patient issues and may not answer your phone call immediately, but will return your call as soon as possible.

## 2022-07-16 NOTE — Unmapped (Signed)
Hi,     Deanna Flores contacted the Communication Center regarding the following:    - States that she would like to speak with Deanna Flores, says that her FMLA paperwork is due this week and she was following up on it.     Please contact Deanna Flores at [phone].    Thanks in advance,    Deanna Flores  [facility]   [phone]

## 2022-07-16 NOTE — Unmapped (Signed)
Completed patient's FMLA as requested and faxed on this date to [phone].  A My Chart message was sent to the patient providing notification of completion with a a copy for her personal records.  A copy has been imported to her medical record for future reference.     Faxed successfully on this date.  Please see below:      The message you sent to [id number] .[email or web address] at [phone], was delivered successfully on 07/16/2022 at 4:20:27 PM     JobID: [id number]     %FAX_PAGE1%

## 2022-07-20 ENCOUNTER — Other Ambulatory Visit: Payer: Self-pay

## 2022-07-20 DIAGNOSIS — R110 Nausea: Secondary | ICD-10-CM

## 2022-07-20 DIAGNOSIS — R109 Unspecified abdominal pain: Secondary | ICD-10-CM

## 2022-07-20 DIAGNOSIS — K581 Irritable bowel syndrome with constipation: Secondary | ICD-10-CM

## 2022-07-20 MED ORDER — ONDANSETRON HCL 4 MG PO TABS: 4.0000 mg | ORAL_TABLET | Freq: Three times a day (TID) | ORAL | 0 refills | Status: DC | PRN

## 2022-07-20 NOTE — Unmapped (Signed)
Addended by: Royann Shivers on: 07/20/2022 01:04 AM     Modules accepted: Orders

## 2022-07-23 NOTE — Unmapped (Signed)
Called patient, identified myself and purpose of call. Confirmed date and time of procedure appt.  Pt. denies allergy to local anesthetics. Pt is not on anticoagulant therapy. Briefly reviewed patient pre-procedure instructions. All questions answered. Pt verbalized understanding.

## 2022-07-24 ENCOUNTER — Telehealth: Payer: Self-pay | Admitting: Gastroenterology

## 2022-07-24 ENCOUNTER — Ambulatory Visit: Admit: 2022-07-24 | Discharge: 2022-07-24 | Payer: PRIVATE HEALTH INSURANCE

## 2022-07-24 DIAGNOSIS — D241 Benign neoplasm of right breast: Principal | ICD-10-CM

## 2022-07-24 NOTE — Telephone Encounter (Signed)
PT is calling to get an update on her Zofran refill. It should be sent to [facility] on [address]. Please advise.

## 2022-07-24 NOTE — Telephone Encounter (Signed)
Let patient know that she has Zofran ready at the pharmacy.

## 2022-07-25 NOTE — Unmapped (Signed)
Contacted patient to follow-up post-procedure. I identified myself and purpose of call.  Pt. denies post-procedure concerns. She reports she is preparing for tomorrow's surgery and has been emotional today.

## 2022-07-26 ENCOUNTER — Encounter
Admit: 2022-07-26 | Discharge: 2022-07-26 | Payer: PRIVATE HEALTH INSURANCE | Attending: Anesthesiology | Primary: Anesthesiology

## 2022-07-26 ENCOUNTER — Ambulatory Visit: Admit: 2022-07-26 | Discharge: 2022-07-26 | Payer: PRIVATE HEALTH INSURANCE

## 2022-07-26 ENCOUNTER — Ambulatory Visit: Admit: 2022-07-26 | Discharge: 2022-07-27 | Payer: PRIVATE HEALTH INSURANCE

## 2022-07-26 HISTORY — PX: OTHER SURGICAL HISTORY: SHX169

## 2022-07-26 MED ORDER — OXYCODONE 5 MG TABLET
ORAL_TABLET | ORAL | 0 refills | 2 days | Status: CP | PRN
Start: 2022-07-26 — End: 2022-07-26

## 2022-07-26 MED ORDER — ACETAMINOPHEN 500 MG TABLET
ORAL_TABLET | Freq: Four times a day (QID) | ORAL | 2 refills | 13 days | Status: CP | PRN
Start: 2022-07-26 — End: 2023-07-26

## 2022-07-26 MED ORDER — TRAMADOL 50 MG TABLET
ORAL_TABLET | Freq: Four times a day (QID) | ORAL | 0 refills | 3 days | Status: CP
Start: 2022-07-26 — End: 2022-07-31

## 2022-07-26 MED ADMIN — ketamine (KETALAR) injection: INTRAVENOUS | @ 14:00:00 | Stop: 2022-07-26

## 2022-07-26 MED ADMIN — acetaminophen (TYLENOL) tablet 1,000 mg: 1000 mg | ORAL | @ 12:00:00 | Stop: 2022-07-26

## 2022-07-26 MED ADMIN — fentaNYL (PF) (SUBLIMAZE) injection: INTRAVENOUS | @ 13:00:00 | Stop: 2022-07-26

## 2022-07-26 MED ADMIN — ceFAZolin (ANCEF) IVPB 2 g in 50 ml dextrose (premix): 2 g | INTRAVENOUS | @ 13:00:00 | Stop: 2022-07-26

## 2022-07-26 MED ADMIN — phenylephrine 0.8 mg/10 mL (80 mcg/mL) injection: INTRAVENOUS | @ 13:00:00 | Stop: 2022-07-26

## 2022-07-26 MED ADMIN — ketamine (KETALAR) injection: INTRAVENOUS | @ 13:00:00 | Stop: 2022-07-26

## 2022-07-26 MED ADMIN — propofol (DIPRIVAN) infusion 10 mg/mL: INTRAVENOUS | @ 13:00:00 | Stop: 2022-07-26

## 2022-07-26 MED ADMIN — lidocaine (XYLOCAINE) 20 mg/mL (2 %) injection: INTRAVENOUS | @ 13:00:00 | Stop: 2022-07-26

## 2022-07-26 MED ADMIN — Propofol (DIPRIVAN) injection: INTRAVENOUS | @ 13:00:00 | Stop: 2022-07-26

## 2022-07-26 MED ADMIN — ondansetron (ZOFRAN) injection: INTRAVENOUS | @ 14:00:00 | Stop: 2022-07-26

## 2022-07-26 MED ADMIN — celecoxib (CeleBREX) capsule 200 mg: 200 mg | ORAL | @ 12:00:00 | Stop: 2022-07-26

## 2022-07-26 MED ADMIN — hydrogen peroxide 3 % 240 mL in sterile water 500 mL OR irrigation: @ 13:00:00 | Stop: 2022-07-26

## 2022-07-26 MED ADMIN — bupivacaine (PF) (MARCAINE) 0.25 % (2.5 mg/mL) injection (PF): @ 14:00:00 | Stop: 2022-07-26

## 2022-07-26 MED ADMIN — oxyCODONE (ROXICODONE) immediate release tablet 5 mg: 5 mg | ORAL | @ 15:00:00 | Stop: 2022-07-26

## 2022-07-26 MED ADMIN — midazolam (VERSED) injection: INTRAVENOUS | @ 13:00:00 | Stop: 2022-07-26

## 2022-07-26 MED ADMIN — dexAMETHasone (DECADRON) 4 mg/mL injection: INTRAVENOUS | @ 14:00:00 | Stop: 2022-07-26

## 2022-07-26 MED ADMIN — lactated Ringers infusion: 10 mL/h | INTRAVENOUS | @ 13:00:00 | Stop: 2022-07-26

## 2022-07-26 NOTE — Unmapped (Addendum)
Received 1,000mg  of acetaminophen (Tylenol) at 8:15AM.

## 2022-07-26 NOTE — Unmapped (Signed)
Brief Operative Note  (CSN: [id number])      Date of Surgery: 07/26/2022    Pre-op Diagnosis: R fibroadenoma    Post-op Diagnosis: Same    Procedure(s):  EXC BREAST LES-ID PRE-OP PLCMT RAD MARKER; OPEN, 1 LES: 19125 (CPT)  Note: Revisions to procedures should be made in chart - see Procedures activity.    Performing Service: Surgical Oncology Breast  Surgeons and Role:     * Cono Gebhard, Carmin Muskrat, DO - Primary     * Horton, Jeri Cos, MD/DMD - Resident - Assisting    Assistant: None    Findings: Right breast lesion removed and sent to pathology.     Anesthesia: Monitor Anesthesia Care    Estimated Blood Loss: 5 mL    Complications: None    Specimens:   ID Type Source Tests Collected by Time Destination   1 : Right Breast Excisional Biopsy at 1:00, 6CFN; short=superior, long=lateral Tissue Breast, Right SURGICAL PATHOLOGY EXAM Talbert Cage, DO 07/26/2022 0945        Implants: * No implants in log *    Surgeon Notes: I was present and scrubbed for the entire procedure    Sherilyn Cooter, DO   Date: 07/26/2022  Time: 11:37 AM

## 2022-07-26 NOTE — Unmapped (Signed)
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 Images included in op note below. I certify that I performed and interpreted all images personally.    Performing Service: Surgical Oncology  Surgeon(s) and Role:     * Eilee Schader Kemper Durie, DO - Primary     * Mosetta Anis, MD - Resident - Assisting    Findings: Right breast lesion located with SAVI SCOUT reflector and removed. Specimen mammographic images with SAVI SCOUT reflector in center of lesion and appearance of rim of normal tissue around area of concern.    Anesthesia: Monitered Anestheisa Care    Estimated Blood Loss: 2 cc    Complications: None    Specimens:   Right breast excisional biopsy, located at 1 o'clock, 6 CFN, Margins marked with ink. (Red=medial, Yellow= Inferior, Green= Lateral, Blue= Superior, Orange= Anterior, Black= Posterior). 1 clip was placed superior and 2 clips were placed laterally for mammographic identification.    Detailed Operative Note:   Indications: Deanna Flores, a 37 y.o. female had an abnormal mammogram. Biopsy of this area showed fibroadenoma and benign but discordant pathology.  After discussion of alternatives, the patient elected excisional biopsy. Accordingly, SAVI SCOUT localized excisional biopsy was planned.     Description of procedure: The breast lesion was localized pre-operatively with a SAVI SCOUT reflector. The patient was brought to the operating room and the side of surgery was verified. General anesthesia was induced. A time-out was completed verifying correct patient, procedure, site, positioning, and special equipment prior to beginning this procedure. The breast and chest wall was then prepped and draped in the usual sterile fashion.     The breast lesion was identified using ultrasound guidance of the SAVI SCOUT reflector and the skin overlying the lesion was marked with marking pen for targeted dissection. A periareolar incision was made. An anterior skin flap was developed at the site of skin markings overlying the lesion. The SAVI hand probe was placed into the wound and the location was confirmed. Dissection continued around the lesion using the SAVI hand probe for orientation. The lesion was resected intact with the SAVI reflector and oriented.  No additional masses were palpated. Intraoperative specimen imaging was performed for intraoperative evaluation of margins and personally reviewed by me during the procedure. [place] and/or Faxitron images included below. It was then sent for specimen mammography and confirmation was received that the entire target lesion had been resected. The wound was irrigated and hemostasis was achieved. Surgical clips were placed into the lumpectomy bed. The surrounding breast tissue was mobilized and approximated in a way to minimize the lumpectomy defect. The skin was allowed to fall over the rearrangement to assess the residual lumpectomy deficit. Additional mobilization of the superior and inferior skin flaps was performed to improve cosmesis. The wound was then closed by approximating the deep dermis with interrupted sutures of 3-0 Vicryl. The superficial dermis was approximated with a running, subcuticular 4-0 Vicryl suture. Local anesthetic was injected into the skin around the incision and reconstructed lumpectomy cavity. Dermabond dressing was used over the incision. At the end  of the operation, all counts were correct.    The patient tolerated the procedure well and was taken to the postanesthesia care unit in satisfactory condition.      Surgeon Notes: I was present and scrubbed for the entire procedure.    Sherilyn Cooter, DO  07/26/2022  11:37 AM      Intraoperative images. I certify that I performed and interpreted all images personally.

## 2022-08-01 NOTE — Unmapped (Signed)
Specialty Medication(s): Humira    Deanna Flores has been dis-enrolled from the [facility] specialty pharmacy services due to a pharmacy change resulting from insurance limitations. The insurance company requires the patient fill at [facility] .    Additional information provided to the patient: n/a    Arnold Long, PharmD  [facility] Specialty Pharmacist

## 2022-08-06 ENCOUNTER — Ambulatory Visit: Admit: 2022-08-06 | Discharge: 2022-08-07 | Payer: PRIVATE HEALTH INSURANCE

## 2022-08-06 DIAGNOSIS — D241 Benign neoplasm of right breast: Principal | ICD-10-CM

## 2022-08-07 ENCOUNTER — Telehealth
Admit: 2022-08-07 | Discharge: 2022-08-08 | Payer: PRIVATE HEALTH INSURANCE | Attending: Registered" | Primary: Registered"

## 2022-08-07 DIAGNOSIS — Z6830 Body mass index (BMI) 30.0-30.9, adult: Principal | ICD-10-CM

## 2022-08-07 DIAGNOSIS — E669 Obesity, unspecified: Principal | ICD-10-CM

## 2022-08-30 ENCOUNTER — Other Ambulatory Visit: Payer: Self-pay | Admitting: Gastroenterology

## 2022-08-31 ENCOUNTER — Telehealth: Payer: Self-pay | Admitting: Gastroenterology

## 2022-08-31 NOTE — Telephone Encounter (Signed)
Patient called requesting a refill for her dicyclomine medication. [facility] [address] [facility] but has not heard anything back. She has a OV on 06/26, requesting a refill until then. Please advise, thank you.

## 2022-09-05 ENCOUNTER — Ambulatory Visit: Admit: 2022-09-05 | Discharge: 2022-09-06 | Payer: PRIVATE HEALTH INSURANCE

## 2022-09-05 MED ORDER — CLINDAMYCIN PHOSPHATE 1 % TOPICAL SWAB
Freq: Two times a day (BID) | TOPICAL | 11 refills | 0.00000 days | Status: CP
Start: 2022-09-05 — End: 2023-08-31

## 2022-09-29 ENCOUNTER — Other Ambulatory Visit: Payer: Self-pay | Admitting: Gastroenterology

## 2022-09-29 DIAGNOSIS — L732 Hidradenitis suppurativa: Principal | ICD-10-CM

## 2022-09-29 MED ORDER — DOXYCYCLINE HYCLATE 100 MG TABLET
ORAL_TABLET | 1 refills | 0.00000 days
Start: 2022-09-29 — End: ?

## 2022-10-01 MED ORDER — DOXYCYCLINE HYCLATE 100 MG TABLET
ORAL_TABLET | 1 refills | 0.00000 days
Start: 2022-10-01 — End: ?

## 2022-10-03 ENCOUNTER — Encounter: Payer: Self-pay | Admitting: Gastroenterology

## 2022-10-03 ENCOUNTER — Ambulatory Visit: Payer: Federal, State, Local not specified - PPO | Admitting: Gastroenterology

## 2022-10-03 VITALS — BP 110/68 | HR 82 | Ht 64.0 in | Wt 177.0 lb

## 2022-10-03 DIAGNOSIS — R110 Nausea: Secondary | ICD-10-CM | POA: Diagnosis not present

## 2022-10-03 DIAGNOSIS — R109 Unspecified abdominal pain: Secondary | ICD-10-CM

## 2022-10-03 DIAGNOSIS — K581 Irritable bowel syndrome with constipation: Secondary | ICD-10-CM

## 2022-10-03 MED ORDER — DICYCLOMINE HCL 20 MG PO TABS: 20.0000 mg | ORAL_TABLET | Freq: Three times a day (TID) | ORAL | 5 refills | Status: DC | PRN

## 2022-10-03 MED ORDER — ONDANSETRON HCL 4 MG PO TABS: 4.0000 mg | ORAL_TABLET | Freq: Four times a day (QID) | ORAL | 5 refills | Status: DC | PRN

## 2022-10-03 NOTE — Patient Instructions (Addendum)
We have sent the following medications to your pharmacy for you to pick up at your convenience:  Bentyl, and Zofran  Follow up in 1 year. _______________________________________________________  If your blood pressure at your visit was 140/90 or greater, please contact your primary care physician to follow up on this.  _______________________________________________________  If you are age 35 or older, your body mass index should be between 23-30. Your Body mass index is 30.38 kg/m. If this is out of the aforementioned range listed, please consider follow up with your Primary Care Provider.  If you are age 39 or younger, your body mass index should be between 19-25. Your Body mass index is 30.38 kg/m. If this is out of the aformentioned range listed, please consider follow up with your Primary Care Provider.   __________________________________________________________  The [place] [facility] providers would like to encourage you to use [facility] to communicate with providers for non-urgent requests or questions.  Due to long hold times on the telephone, sending your provider a message by [facility] may be a faster and more efficient way to get a response.  Please allow 48 business hours for a response.  Please remember that this is for non-urgent requests.   Due to recent changes in healthcare laws, you may see the results of your imaging and laboratory studies on MyChart before your provider has had a chance to review them.  We understand that in some cases there may be results that are confusing or concerning to you. Not all laboratory results come back in the same time frame and the provider may be waiting for multiple results in order to interpret others.  Please give [place] 48 hours in order for your provider to thoroughly review all the results before contacting the office for clarification of your results.    Thank you for choosing me and [facility].  Vito Cirigliano, D.O.

## 2022-10-03 NOTE — Progress Notes (Signed)
Chief Complaint:    Medication refill, IBS  GI History: 37 y.o. female with a history of anxiety disorder, GERD, gastritis, IBS, history of pancreatitis, marijuana use.  History of admission in 08/2018 with retroperitoneal free air on CT and readmission later that month with pneumomediastinum (left AMA).  Please see note dated 09/30/2019 for full details of that work-up and nonoperative management.    Evaluation to date: - CT (08/13/2018): Free air in right retroperitoneum, with source suspected to be retroperitoneal portion of a sending colon, but no clear point perforation identified.  Mild ascending/transverse colitis - UGI series (08/14/2018): Normal stomach, mildly prominent distal duodenum.  No extravasation - CT (08/15/2018): Diminished pneumomediastinum, retroperitoneal air and chest/abdominal wall air.  Etiology felt to be chest related.  No intra-abdominal pathology - CT C/A/P (08/17/2018): Moderate pneumomediastinum extending into the neck and decreased right retroperitoneal/right lateral abdominal subcutaneous gas.  Likely represents rupture of a central airway with extension into the neck and abdomen.  No pneumothorax -Barium esophagram (08/18/2018): Normal.  No leak.  No HH or reflux   Endoscopic History: -EGD (01/21/2019, Dr. Barron Alvine): Mild luminal deformity prepyloric antrum consistent with previously healed ulcer, mild gastritis, normal esophagus/duodenum -Colonoscopy (01/21/2019, Dr. Barron Alvine): Single aphthous ulcer in TI (biopsies benign), internal hemorrhoids, otherwise normal colonoscopy.  Repeat age 88   Separately, she has a Hx of IBS-C. Cramping previously well controlled with intermittent use of Bentyl. Otherwise, does well with dietary mods and avoiding trigger type foods. Sxs worse with anxiety. Can also have nausea with abd cramping. Sxs present for at least 5 years.     No known family history of CRC, GI malignancy, liver disease, pancreatic disease, or IBD.   HPI:      Patient is a 37 y.o. female presenting to the [facility] for follow-up and medication refill.  Requesting refill of Zofran.  Prefers tablet instead of ODT.  Also requesting refill of Bentyl.  IBS otherwise largely well-controlled on current therapy along with avoidance of exacerbating type foods.   Review of systems:     No chest pain, no SOB, no fevers, no urinary sx   Past Medical History:  Diagnosis Date   Acid reflux disease    ADHD    Anxiety    Cannabis abuse    Depression    Gastritis    GERD (gastroesophageal reflux disease)    IBS (irritable bowel syndrome)    Pancreatitis    Suppurative hidradenitis     Patient's surgical history, family medical history, social history, medications and allergies were all reviewed in Epic    Current Outpatient Medications  Medication Sig Dispense Refill   dicyclomine (BENTYL) 20 MG tablet Take 1 tablet (20 mg total) by mouth every 8 (eight) hours as needed for spasms. Please keep your June appointment for further refills. Thank you 60 tablet 0   ondansetron (ZOFRAN) 4 MG tablet Take 1 tablet (4 mg total) by mouth every 8 (eight) hours as needed for nausea. Please keep your upcoming appointment. 30 tablet 0   No current facility-administered medications for this visit.    Physical Exam:     BP 110/68   Pulse 82   Ht 5\' 4"  (1.626 m)   Wt 177 lb (80.3 kg)   BMI 30.38 kg/m   GENERAL:  Pleasant female in NAD PSYCH: : Cooperative, normal affect Musculoskeletal:  Normal muscle tone, normal strength NEURO: Alert and oriented x 3, no focal neurologic deficits   IMPRESSION and PLAN:  1) IBS-C 2) Nausea 3) Abdominal cramping Symptoms overall largely well-controlled with current dietary and medications. - Bentyl 20 mg prn every 8 hours as needed with RF 5 - Zofran 4 mg every 6 hours as needed with RF 5  RTC in 1 year or sooner prn       Verlin Dike Yadhira Mckneely ,DO, FACG 10/03/2022, 10:07 AM

## 2022-10-05 ENCOUNTER — Ambulatory Visit
Admit: 2022-10-05 | Discharge: 2022-10-06 | Payer: PRIVATE HEALTH INSURANCE | Attending: Student in an Organized Health Care Education/Training Program | Primary: Student in an Organized Health Care Education/Training Program

## 2022-10-05 DIAGNOSIS — L732 Hidradenitis suppurativa: Principal | ICD-10-CM

## 2022-10-12 ENCOUNTER — Ambulatory Visit: Admit: 2022-10-12 | Discharge: 2022-10-13 | Payer: PRIVATE HEALTH INSURANCE

## 2022-10-12 DIAGNOSIS — R1084 Generalized abdominal pain: Principal | ICD-10-CM

## 2022-10-12 DIAGNOSIS — R350 Frequency of micturition: Principal | ICD-10-CM

## 2022-10-12 DIAGNOSIS — R7303 Prediabetes: Principal | ICD-10-CM

## 2022-10-12 DIAGNOSIS — M62838 Other muscle spasm: Principal | ICD-10-CM

## 2022-10-12 DIAGNOSIS — F32A Depression, unspecified depression type: Principal | ICD-10-CM

## 2022-11-09 ENCOUNTER — Ambulatory Visit
Admit: 2022-11-09 | Discharge: 2022-11-10 | Payer: PRIVATE HEALTH INSURANCE | Attending: Student in an Organized Health Care Education/Training Program | Primary: Student in an Organized Health Care Education/Training Program

## 2022-11-09 DIAGNOSIS — L732 Hidradenitis suppurativa: Principal | ICD-10-CM

## 2022-11-28 ENCOUNTER — Ambulatory Visit: Admit: 2022-11-28 | Discharge: 2022-11-28 | Payer: PRIVATE HEALTH INSURANCE

## 2022-12-05 ENCOUNTER — Ambulatory Visit: Admit: 2022-12-05 | Payer: PRIVATE HEALTH INSURANCE

## 2022-12-20 ENCOUNTER — Ambulatory Visit: Admit: 2022-12-20 | Discharge: 2022-12-21 | Payer: PRIVATE HEALTH INSURANCE

## 2022-12-20 ENCOUNTER — Ambulatory Visit
Admit: 2022-12-20 | Discharge: 2022-12-21 | Payer: PRIVATE HEALTH INSURANCE | Attending: Addiction (Substance Use Disorder) | Primary: Addiction (Substance Use Disorder)

## 2022-12-26 ENCOUNTER — Ambulatory Visit: Admit: 2022-12-26 | Discharge: 2022-12-27 | Payer: PRIVATE HEALTH INSURANCE

## 2022-12-26 DIAGNOSIS — L732 Hidradenitis suppurativa: Principal | ICD-10-CM

## 2022-12-26 MED ORDER — DOXYCYCLINE MONOHYDRATE 100 MG CAPSULE
ORAL_CAPSULE | 2 refills | 0.00000 days | Status: CP
Start: 2022-12-26 — End: ?

## 2023-01-03 ENCOUNTER — Ambulatory Visit
Admit: 2023-01-03 | Discharge: 2023-01-04 | Payer: PRIVATE HEALTH INSURANCE | Attending: Addiction (Substance Use Disorder) | Primary: Addiction (Substance Use Disorder)

## 2023-01-17 ENCOUNTER — Ambulatory Visit
Admit: 2023-01-17 | Discharge: 2023-01-18 | Payer: PRIVATE HEALTH INSURANCE | Attending: Addiction (Substance Use Disorder) | Primary: Addiction (Substance Use Disorder)

## 2023-01-21 DIAGNOSIS — L732 Hidradenitis suppurativa: Principal | ICD-10-CM

## 2023-01-21 MED ORDER — DOXYCYCLINE HYCLATE 100 MG TABLET
ORAL_TABLET | 1 refills | 0.00000 days
Start: 2023-01-21 — End: ?

## 2023-01-25 ENCOUNTER — Ambulatory Visit: Admit: 2023-01-25 | Discharge: 2023-01-26 | Payer: PRIVATE HEALTH INSURANCE

## 2023-01-25 MED ORDER — SEMAGLUTIDE (WEIGHT LOSS) 1 MG/0.5 ML SUBCUTANEOUS PEN INJECTOR
SUBCUTANEOUS | 0 refills | 0 days | Status: CP
Start: 2023-01-25 — End: ?

## 2023-01-25 MED ORDER — SEMAGLUTIDE (WEIGHT LOSS) 0.25 MG/0.5 ML SUBCUTANEOUS PEN INJECTOR
SUBCUTANEOUS | 0 refills | 0 days | Status: CP
Start: 2023-01-25 — End: ?

## 2023-01-25 MED ORDER — SEMAGLUTIDE (WEIGHT LOSS) 2.4 MG/0.75 ML SUBCUTANEOUS PEN INJECTOR
SUBCUTANEOUS | 0 refills | 28 days | Status: CP
Start: 2023-01-25 — End: ?

## 2023-01-25 MED ORDER — SEMAGLUTIDE (WEIGHT LOSS) 0.5 MG/0.5 ML SUBCUTANEOUS PEN INJECTOR
SUBCUTANEOUS | 0 refills | 0 days | Status: CP
Start: 2023-01-25 — End: ?

## 2023-01-25 MED ORDER — SEMAGLUTIDE (WEIGHT LOSS) 1.7 MG/0.75 ML SUBCUTANEOUS PEN INJECTOR
SUBCUTANEOUS | 0 refills | 28 days | Status: CP
Start: 2023-01-25 — End: ?

## 2023-01-30 ENCOUNTER — Ambulatory Visit: Admit: 2023-01-30 | Discharge: 2023-01-31 | Payer: PRIVATE HEALTH INSURANCE

## 2023-01-30 DIAGNOSIS — L732 Hidradenitis suppurativa: Principal | ICD-10-CM

## 2023-02-04 ENCOUNTER — Other Ambulatory Visit (HOSPITAL_COMMUNITY): Payer: Self-pay

## 2023-02-04 MED ORDER — SEMAGLUTIDE (WEIGHT LOSS) 0.25 MG/0.5 ML SUBCUTANEOUS PEN INJECTOR
SUBCUTANEOUS | 0 refills | 0 days | Status: CP
Start: 2023-02-04 — End: ?

## 2023-02-04 MED ORDER — WEGOVY 0.25 MG/0.5ML ~~LOC~~ SOAJ
0.2500 mg | SUBCUTANEOUS | 0 refills | Status: DC
  Filled 2023-02-04: qty 2, 28d supply, fill #0

## 2023-02-07 ENCOUNTER — Ambulatory Visit
Admit: 2023-02-07 | Discharge: 2023-02-08 | Payer: BLUE CROSS/BLUE SHIELD | Attending: Addiction (Substance Use Disorder) | Primary: Addiction (Substance Use Disorder)

## 2023-02-20 ENCOUNTER — Other Ambulatory Visit (HOSPITAL_COMMUNITY): Payer: Self-pay

## 2023-02-20 DIAGNOSIS — E66811 Class 1 obesity with body mass index (BMI) of 32.0 to 32.9 in adult, unspecified obesity type, unspecified whether serious comorbidity present: Principal | ICD-10-CM

## 2023-02-20 DIAGNOSIS — Z6832 Body mass index (BMI) 32.0-32.9, adult: Principal | ICD-10-CM

## 2023-02-20 MED ORDER — WEGOVY 0.25 MG/0.5 ML SUBCUTANEOUS PEN INJECTOR
0 refills | 0 days
Start: 2023-02-20 — End: ?

## 2023-02-21 ENCOUNTER — Other Ambulatory Visit (HOSPITAL_COMMUNITY): Payer: Self-pay

## 2023-02-21 ENCOUNTER — Encounter (HOSPITAL_COMMUNITY): Payer: Self-pay

## 2023-02-21 MED ORDER — WEGOVY 0.25 MG/0.5 ML SUBCUTANEOUS PEN INJECTOR
0 refills | 0 days
Start: 2023-02-21 — End: ?

## 2023-02-22 MED ORDER — SEMAGLUTIDE (WEIGHT LOSS) 0.25 MG/0.5 ML SUBCUTANEOUS PEN INJECTOR
SUBCUTANEOUS | 0 refills | 0 days | Status: CP
Start: 2023-02-22 — End: 2023-02-22

## 2023-02-25 ENCOUNTER — Other Ambulatory Visit (HOSPITAL_COMMUNITY): Payer: Self-pay

## 2023-02-25 DIAGNOSIS — Z6832 Body mass index (BMI) 32.0-32.9, adult: Principal | ICD-10-CM

## 2023-02-25 DIAGNOSIS — E66811 Class 1 obesity with body mass index (BMI) of 32.0 to 32.9 in adult, unspecified obesity type, unspecified whether serious comorbidity present: Principal | ICD-10-CM

## 2023-02-25 MED ORDER — SEMAGLUTIDE (WEIGHT LOSS) 2.4 MG/0.75 ML SUBCUTANEOUS PEN INJECTOR
SUBCUTANEOUS | 0 refills | 28 days | Status: CP
Start: 2023-02-25 — End: ?

## 2023-02-25 MED ORDER — SEMAGLUTIDE (WEIGHT LOSS) 1.7 MG/0.75 ML SUBCUTANEOUS PEN INJECTOR
SUBCUTANEOUS | 0 refills | 28 days | Status: CP
Start: 2023-02-25 — End: ?

## 2023-02-25 MED ORDER — WEGOVY 0.25 MG/0.5 ML SUBCUTANEOUS PEN INJECTOR
0 refills | 0 days | Status: CP
Start: 2023-02-25 — End: ?

## 2023-02-25 MED ORDER — SEMAGLUTIDE (WEIGHT LOSS) 0.5 MG/0.5 ML SUBCUTANEOUS PEN INJECTOR
SUBCUTANEOUS | 0 refills | 0 days | Status: CP
Start: 2023-02-25 — End: ?

## 2023-02-25 MED ORDER — SEMAGLUTIDE (WEIGHT LOSS) 1 MG/0.5 ML SUBCUTANEOUS PEN INJECTOR
SUBCUTANEOUS | 0 refills | 0 days | Status: CP
Start: 2023-02-25 — End: ?

## 2023-02-25 MED ORDER — WEGOVY 0.5 MG/0.5ML ~~LOC~~ SOAJ
0.5000 mg | SUBCUTANEOUS | 0 refills | Status: DC
  Filled 2023-02-25 – 2023-03-04 (×3): qty 2, 28d supply, fill #0

## 2023-02-25 MED ORDER — WEGOVY 1 MG/0.5ML ~~LOC~~ SOAJ
1.0000 mg | SUBCUTANEOUS | 0 refills | Status: DC
  Filled 2023-03-25: qty 2, 28d supply, fill #0

## 2023-02-25 MED ORDER — SEMAGLUTIDE-WEIGHT MANAGEMENT 0.25 MG/0.5ML ~~LOC~~ SOAJ
0.2500 mg | SUBCUTANEOUS | 0 refills | Status: DC
  Filled 2023-02-25: qty 2, 28d supply, fill #0

## 2023-02-25 MED ORDER — WEGOVY 1.7 MG/0.75ML ~~LOC~~ SOAJ: 1.7000 mg | SUBCUTANEOUS | 0 refills | Status: DC

## 2023-02-25 MED ORDER — WEGOVY 2.4 MG/0.75ML ~~LOC~~ SOAJ: 2.4000 mg | SUBCUTANEOUS | 0 refills | Status: DC

## 2023-02-26 ENCOUNTER — Other Ambulatory Visit (HOSPITAL_COMMUNITY): Payer: Self-pay

## 2023-02-26 ENCOUNTER — Telehealth (HOSPITAL_COMMUNITY): Payer: Self-pay

## 2023-02-26 DIAGNOSIS — Z6832 Body mass index (BMI) 32.0-32.9, adult: Principal | ICD-10-CM

## 2023-02-26 DIAGNOSIS — E66811 Class 1 obesity with body mass index (BMI) of 32.0 to 32.9 in adult, unspecified obesity type, unspecified whether serious comorbidity present: Principal | ICD-10-CM

## 2023-02-26 MED ORDER — WEGOVY 0.25 MG/0.5 ML SUBCUTANEOUS PEN INJECTOR
SUBCUTANEOUS | 0 refills | 0 days | Status: CP
Start: 2023-02-26 — End: ?

## 2023-02-26 MED ORDER — WEGOVY 0.25 MG/0.5ML ~~LOC~~ SOAJ
0.2500 mg | SUBCUTANEOUS | 0 refills | Status: DC
  Filled 2023-02-26 – 2023-03-01 (×2): qty 2, 28d supply, fill #0

## 2023-02-26 NOTE — Telephone Encounter (Signed)
Pharmacy Patient Advocate Encounter   Received notification from CoverMyMeds that prior authorization for Zofran 4 mg tablets is required/requested.   Insurance verification completed.   The patient is insured through [facility] .   Per test claim: PA required; PA submitted to above mentioned insurance via CoverMyMeds Key/confirmation #/EOC [id number] Status is pending

## 2023-02-27 ENCOUNTER — Ambulatory Visit
Admit: 2023-02-27 | Discharge: 2023-03-04 | Payer: BLUE CROSS/BLUE SHIELD | Attending: Rehabilitative and Restorative Service Providers" | Primary: Rehabilitative and Restorative Service Providers"

## 2023-03-01 ENCOUNTER — Other Ambulatory Visit (HOSPITAL_COMMUNITY): Payer: Self-pay

## 2023-03-04 ENCOUNTER — Other Ambulatory Visit (HOSPITAL_COMMUNITY): Payer: Self-pay

## 2023-03-05 NOTE — Telephone Encounter (Signed)
Pharmacy Patient Advocate Encounter  Received notification from [facility] federal  that Prior Authorization for Ondansetron 4mg  has been DENIED.  Full denial letter will be uploaded to the media tab. See denial reason below.    PA #/Case ID/Reference #: PA Case ID #: [id number]

## 2023-03-06 ENCOUNTER — Ambulatory Visit
Admit: 2023-03-06 | Payer: BLUE CROSS/BLUE SHIELD | Attending: Rehabilitative and Restorative Service Providers" | Primary: Rehabilitative and Restorative Service Providers"

## 2023-03-13 ENCOUNTER — Ambulatory Visit: Admit: 2023-03-13 | Discharge: 2023-03-14 | Payer: BLUE CROSS/BLUE SHIELD

## 2023-03-25 ENCOUNTER — Other Ambulatory Visit (HOSPITAL_COMMUNITY): Payer: Self-pay

## 2023-03-26 ENCOUNTER — Other Ambulatory Visit (HOSPITAL_COMMUNITY): Payer: Self-pay

## 2023-03-28 ENCOUNTER — Other Ambulatory Visit (HOSPITAL_COMMUNITY): Payer: Self-pay

## 2023-04-19 ENCOUNTER — Ambulatory Visit: Admit: 2023-04-19 | Discharge: 2023-04-20 | Payer: BLUE CROSS/BLUE SHIELD

## 2023-04-19 DIAGNOSIS — L732 Hidradenitis suppurativa: Principal | ICD-10-CM

## 2023-04-22 ENCOUNTER — Other Ambulatory Visit (HOSPITAL_COMMUNITY): Payer: Self-pay

## 2023-04-22 DIAGNOSIS — E66811 Class 1 obesity with body mass index (BMI) of 32.0 to 32.9 in adult, unspecified obesity type, unspecified whether serious comorbidity present: Principal | ICD-10-CM

## 2023-04-22 DIAGNOSIS — Z6832 Body mass index (BMI) 32.0-32.9, adult: Principal | ICD-10-CM

## 2023-04-22 MED ORDER — WEGOVY 1 MG/0.5 ML SUBCUTANEOUS PEN INJECTOR
0 refills | 0.00 days
Start: 2023-04-22 — End: ?

## 2023-04-23 ENCOUNTER — Other Ambulatory Visit (HOSPITAL_COMMUNITY): Payer: Self-pay

## 2023-04-23 MED ORDER — WEGOVY 1 MG/0.5 ML SUBCUTANEOUS PEN INJECTOR
0 refills | 0.00 days | Status: CP
Start: 2023-04-23 — End: ?

## 2023-04-23 MED ORDER — SEMAGLUTIDE-WEIGHT MANAGEMENT 1 MG/0.5ML ~~LOC~~ SOAJ
1.0000 mg | SUBCUTANEOUS | 0 refills | Status: DC
  Filled 2023-04-23: qty 2, 28d supply, fill #0

## 2023-04-26 ENCOUNTER — Other Ambulatory Visit (HOSPITAL_COMMUNITY): Payer: Self-pay

## 2023-05-03 ENCOUNTER — Other Ambulatory Visit (HOSPITAL_COMMUNITY): Payer: Self-pay

## 2023-05-31 ENCOUNTER — Ambulatory Visit: Admit: 2023-05-31 | Discharge: 2023-06-01 | Payer: BLUE CROSS/BLUE SHIELD

## 2023-05-31 MED ORDER — BUPROPION HCL XL 150 MG 24 HR TABLET, EXTENDED RELEASE
ORAL_TABLET | Freq: Every morning | ORAL | 2 refills | 30.00 days | Status: CP
Start: 2023-05-31 — End: ?

## 2023-06-07 DIAGNOSIS — L732 Hidradenitis suppurativa: Principal | ICD-10-CM

## 2023-06-07 MED ORDER — HUMIRA PEN CITRATE FREE 40 MG/0.4 ML
SUBCUTANEOUS | 11 refills | 28.00 days
Start: 2023-06-07 — End: ?

## 2023-06-10 MED ORDER — HUMIRA PEN CITRATE FREE 40 MG/0.4 ML
SUBCUTANEOUS | 0 refills | 28.00 days | Status: CP
Start: 2023-06-10 — End: ?

## 2023-06-12 DIAGNOSIS — L732 Hidradenitis suppurativa: Principal | ICD-10-CM

## 2023-06-12 MED ORDER — DOXYCYCLINE MONOHYDRATE 100 MG CAPSULE
ORAL_CAPSULE | Freq: Two times a day (BID) | ORAL | 0 refills | 21.00 days | Status: CP
Start: 2023-06-12 — End: ?

## 2023-06-24 DIAGNOSIS — F324 Major depressive disorder, single episode, in partial remission: Principal | ICD-10-CM

## 2023-06-24 DIAGNOSIS — L732 Hidradenitis suppurativa: Principal | ICD-10-CM

## 2023-06-24 DIAGNOSIS — Z6830 Body mass index (BMI) 30.0-30.9, adult: Principal | ICD-10-CM

## 2023-06-24 MED ORDER — BUPROPION HCL XL 150 MG 24 HR TABLET, EXTENDED RELEASE
ORAL_TABLET | Freq: Every morning | ORAL | 1 refills | 0.00 days
Start: 2023-06-24 — End: ?

## 2023-06-24 MED ORDER — HUMIRA PEN CITRATE FREE 40 MG/0.4 ML
7 refills | 0.00 days
Start: 2023-06-24 — End: ?

## 2023-06-25 MED ORDER — HUMIRA PEN CITRATE FREE 40 MG/0.4 ML
7 refills | 0.00 days
Start: 2023-06-25 — End: ?

## 2023-06-26 MED ORDER — BUPROPION HCL XL 150 MG 24 HR TABLET, EXTENDED RELEASE
ORAL_TABLET | Freq: Every morning | ORAL | 1 refills | 90.00 days | Status: CP
Start: 2023-06-26 — End: ?

## 2023-07-03 ENCOUNTER — Ambulatory Visit: Admit: 2023-07-03 | Discharge: 2023-07-04 | Payer: BLUE CROSS/BLUE SHIELD

## 2023-07-03 DIAGNOSIS — L732 Hidradenitis suppurativa: Principal | ICD-10-CM

## 2023-07-03 DIAGNOSIS — F411 Generalized anxiety disorder: Principal | ICD-10-CM

## 2023-07-03 DIAGNOSIS — L910 Hypertrophic scar: Principal | ICD-10-CM

## 2023-07-03 DIAGNOSIS — Z79899 Other long term (current) drug therapy: Principal | ICD-10-CM

## 2023-07-03 DIAGNOSIS — L309 Dermatitis, unspecified: Principal | ICD-10-CM

## 2023-07-03 MED ORDER — TRIAMCINOLONE ACETONIDE 0.1 % TOPICAL CREAM
Freq: Two times a day (BID) | TOPICAL | 0 refills | 0 days | Status: CP
Start: 2023-07-03 — End: 2024-07-02

## 2023-07-03 MED ORDER — DIAZEPAM 10 MG TABLET
ORAL_TABLET | Freq: Once | ORAL | 0 refills | 1 days | Status: CP
Start: 2023-07-03 — End: 2023-07-03

## 2023-07-03 MED ORDER — HUMIRA PEN CITRATE FREE 40 MG/0.4 ML
SUBCUTANEOUS | 11 refills | 28 days | Status: CP
Start: 2023-07-03 — End: ?

## 2023-08-02 ENCOUNTER — Ambulatory Visit
Admit: 2023-08-02 | Discharge: 2023-08-03 | Payer: BLUE CROSS/BLUE SHIELD | Attending: Student in an Organized Health Care Education/Training Program | Primary: Student in an Organized Health Care Education/Training Program

## 2023-08-02 DIAGNOSIS — L732 Hidradenitis suppurativa: Principal | ICD-10-CM

## 2023-08-02 DIAGNOSIS — L910 Hypertrophic scar: Principal | ICD-10-CM

## 2023-08-07 ENCOUNTER — Other Ambulatory Visit (HOSPITAL_COMMUNITY): Payer: Self-pay

## 2023-08-07 ENCOUNTER — Encounter: Admit: 2023-08-07 | Discharge: 2023-08-08 | Payer: BLUE CROSS/BLUE SHIELD

## 2023-08-07 DIAGNOSIS — R030 Elevated blood-pressure reading, without diagnosis of hypertension: Principal | ICD-10-CM

## 2023-08-07 DIAGNOSIS — Z6830 Body mass index (BMI) 30.0-30.9, adult: Principal | ICD-10-CM

## 2023-08-07 DIAGNOSIS — F324 Major depressive disorder, single episode, in partial remission: Principal | ICD-10-CM

## 2023-08-07 MED ORDER — SEMAGLUTIDE (WEIGHT LOSS) 1.7 MG/0.75 ML SUBCUTANEOUS PEN INJECTOR
SUBCUTANEOUS | 0 refills | 28.00000 days | Status: CP
Start: 2023-08-07 — End: 2023-08-07

## 2023-08-07 MED ORDER — MISCELLANEOUS MEDICAL SUPPLY MISC
Freq: Once | 0 refills | 0.00000 days | Status: CP
Start: 2023-08-07 — End: 2023-08-07

## 2023-08-07 MED ORDER — SEMAGLUTIDE (WEIGHT LOSS) 0.5 MG/0.5 ML SUBCUTANEOUS PEN INJECTOR
SUBCUTANEOUS | 0 refills | 0.00000 days | Status: CP
Start: 2023-08-07 — End: 2023-08-07

## 2023-08-07 MED ORDER — SEMAGLUTIDE (WEIGHT LOSS) 0.25 MG/0.5 ML SUBCUTANEOUS PEN INJECTOR
SUBCUTANEOUS | 0 refills | 0.00000 days | Status: CP
Start: 2023-08-07 — End: 2023-08-07

## 2023-08-07 MED ORDER — SEMAGLUTIDE (WEIGHT LOSS) 2.4 MG/0.75 ML SUBCUTANEOUS PEN INJECTOR
SUBCUTANEOUS | 0 refills | 28.00000 days | Status: CP
Start: 2023-08-07 — End: 2023-08-07

## 2023-08-07 MED ORDER — SEMAGLUTIDE (WEIGHT LOSS) 1 MG/0.5 ML SUBCUTANEOUS PEN INJECTOR
SUBCUTANEOUS | 0 refills | 0.00000 days | Status: CP
Start: 2023-08-07 — End: 2023-08-07

## 2023-08-07 MED ORDER — WEGOVY 1.7 MG/0.75ML ~~LOC~~ SOAJ: 1.7000 mg | SUBCUTANEOUS | 0 refills | Status: DC

## 2023-08-07 MED ORDER — WEGOVY 1 MG/0.5ML ~~LOC~~ SOAJ
1.0000 mg | SUBCUTANEOUS | 0 refills | Status: DC
Start: 2023-08-07 — End: 2023-10-01

## 2023-08-07 MED ORDER — WEGOVY 2.4 MG/0.75ML ~~LOC~~ SOAJ: 2.4000 mg | SUBCUTANEOUS | 0 refills | Status: DC

## 2023-08-07 MED ORDER — WEGOVY 0.5 MG/0.5ML ~~LOC~~ SOAJ
0.5000 mg | SUBCUTANEOUS | 0 refills | Status: DC
Start: 2023-08-07 — End: 2023-10-01
  Filled 2023-09-16: qty 2, 28d supply, fill #0

## 2023-08-07 MED ORDER — WEGOVY 0.25 MG/0.5ML ~~LOC~~ SOAJ
0.2500 mg | SUBCUTANEOUS | 0 refills | Status: DC
  Filled 2023-08-07 – 2023-09-16 (×4): qty 2, 28d supply, fill #0

## 2023-08-08 ENCOUNTER — Other Ambulatory Visit (HOSPITAL_COMMUNITY): Payer: Self-pay

## 2023-08-13 ENCOUNTER — Other Ambulatory Visit (HOSPITAL_COMMUNITY): Payer: Self-pay

## 2023-09-16 ENCOUNTER — Other Ambulatory Visit (HOSPITAL_COMMUNITY): Payer: Self-pay

## 2023-10-01 DIAGNOSIS — L732 Hidradenitis suppurativa: Principal | ICD-10-CM

## 2023-10-01 DIAGNOSIS — L910 Hypertrophic scar: Principal | ICD-10-CM

## 2023-10-01 MED ORDER — HYDROCODONE 5 MG-ACETAMINOPHEN 325 MG TABLET
ORAL_TABLET | Freq: Four times a day (QID) | ORAL | 0 refills | 3.00000 days | Status: CP | PRN
Start: 2023-10-01 — End: ?

## 2023-10-05 ENCOUNTER — Other Ambulatory Visit: Payer: Self-pay | Admitting: Gastroenterology

## 2023-10-14 ENCOUNTER — Other Ambulatory Visit (HOSPITAL_COMMUNITY): Payer: Self-pay

## 2023-10-14 DIAGNOSIS — Z6830 Body mass index (BMI) 30.0-30.9, adult: Principal | ICD-10-CM

## 2023-10-14 MED ORDER — SEMAGLUTIDE (WEIGHT LOSS) 1.7 MG/0.75 ML SUBCUTANEOUS PEN INJECTOR
SUBCUTANEOUS | 0 refills | 28.00000 days | Status: CP
Start: 2023-10-14 — End: ?

## 2023-10-14 MED ORDER — WEGOVY 0.25 MG/0.5 ML SUBCUTANEOUS PEN INJECTOR
0 refills | 0.00000 days
Start: 2023-10-14 — End: ?

## 2023-10-14 MED ORDER — SEMAGLUTIDE (WEIGHT LOSS) 1 MG/0.5 ML SUBCUTANEOUS PEN INJECTOR
SUBCUTANEOUS | 0 refills | 0.00000 days | Status: CP
Start: 2023-10-14 — End: ?

## 2023-10-14 MED ORDER — SEMAGLUTIDE (WEIGHT LOSS) 0.5 MG/0.5 ML SUBCUTANEOUS PEN INJECTOR
SUBCUTANEOUS | 0 refills | 0.00000 days | Status: CP
Start: 2023-10-14 — End: ?

## 2023-10-14 MED ORDER — WEGOVY 1 MG/0.5ML ~~LOC~~ SOAJ: 1.0000 mg | SUBCUTANEOUS | 0 refills | Status: AC

## 2023-10-14 MED ORDER — WEGOVY 0.5 MG/0.5ML ~~LOC~~ SOAJ
0.5000 mg | SUBCUTANEOUS | 0 refills | Status: DC
  Filled 2023-10-14: qty 2, 28d supply, fill #0

## 2023-10-14 MED ORDER — WEGOVY 1.7 MG/0.75ML ~~LOC~~ SOAJ: 1.7000 mg | SUBCUTANEOUS | 0 refills | Status: DC

## 2023-10-15 ENCOUNTER — Other Ambulatory Visit (HOSPITAL_COMMUNITY): Payer: Self-pay

## 2023-10-16 ENCOUNTER — Other Ambulatory Visit (HOSPITAL_COMMUNITY): Payer: Self-pay

## 2023-10-17 MED ORDER — CLINDAMYCIN PHOSPHATE 1 % TOPICAL SWAB
Freq: Two times a day (BID) | TOPICAL | 5 refills | 0.00000 days | Status: CP
Start: 2023-10-17 — End: ?

## 2023-10-25 MED ORDER — CLINDAMYCIN PHOSPHATE 1 % TOPICAL SWAB
Freq: Two times a day (BID) | TOPICAL | 5 refills | 0.00000 days
Start: 2023-10-25 — End: ?

## 2023-10-28 MED ORDER — CLINDAMYCIN PHOSPHATE 1 % TOPICAL SWAB
Freq: Two times a day (BID) | TOPICAL | 5 refills | 0.00000 days
Start: 2023-10-28 — End: ?

## 2023-10-29 ENCOUNTER — Other Ambulatory Visit (HOSPITAL_COMMUNITY): Payer: Self-pay

## 2023-11-10 ENCOUNTER — Other Ambulatory Visit (HOSPITAL_COMMUNITY): Payer: Self-pay

## 2023-11-10 DIAGNOSIS — Z6830 Body mass index (BMI) 30.0-30.9, adult: Principal | ICD-10-CM

## 2023-11-10 MED ORDER — SEMAGLUTIDE (WEIGHT LOSS) 1 MG/0.5 ML SUBCUTANEOUS PEN INJECTOR
SUBCUTANEOUS | 0 refills | 0.00000 days | Status: CP
Start: 2023-11-10 — End: ?

## 2023-11-10 MED ORDER — WEGOVY 1 MG/0.5ML ~~LOC~~ SOAJ
1.0000 mg | SUBCUTANEOUS | 0 refills | Status: DC
  Filled 2023-11-10: qty 2, 28d supply, fill #0

## 2023-11-13 ENCOUNTER — Other Ambulatory Visit (HOSPITAL_COMMUNITY): Payer: Self-pay

## 2023-11-19 DIAGNOSIS — L732 Hidradenitis suppurativa: Principal | ICD-10-CM

## 2023-11-19 DIAGNOSIS — Z79899 Other long term (current) drug therapy: Principal | ICD-10-CM

## 2023-11-19 DIAGNOSIS — L910 Hypertrophic scar: Principal | ICD-10-CM

## 2023-12-25 DIAGNOSIS — L732 Hidradenitis suppurativa: Principal | ICD-10-CM

## 2023-12-25 DIAGNOSIS — L709 Acne, unspecified: Principal | ICD-10-CM

## 2023-12-25 DIAGNOSIS — L910 Hypertrophic scar: Principal | ICD-10-CM

## 2023-12-25 DIAGNOSIS — Z79899 Other long term (current) drug therapy: Principal | ICD-10-CM

## 2023-12-25 MED ORDER — TRETINOIN 0.025 % TOPICAL CREAM
TOPICAL | 5 refills | 0.00000 days | Status: CP
Start: 2023-12-25 — End: ?

## 2023-12-25 MED ORDER — CLINDAMYCIN PHOSPHATE 1 % TOPICAL SWAB
5 refills | 0.00000 days | Status: CP
Start: 2023-12-25 — End: ?

## 2024-02-23 ENCOUNTER — Other Ambulatory Visit: Payer: Self-pay | Admitting: Gastroenterology

## 2024-02-27 MED ORDER — METFORMIN 500 MG TABLET
ORAL_TABLET | Freq: Every day | ORAL | 2 refills | 30.00000 days | Status: CP
Start: 2024-02-27 — End: 2025-04-02

## 2024-02-29 DIAGNOSIS — R110 Nausea: Principal | ICD-10-CM

## 2024-02-29 MED ORDER — ONDANSETRON 4 MG DISINTEGRATING TABLET
ORAL_TABLET | Freq: Every day | 0 refills | 7.00000 days | Status: CP | PRN
Start: 2024-02-29 — End: 2024-03-07

## 2024-03-11 DIAGNOSIS — L732 Hidradenitis suppurativa: Principal | ICD-10-CM

## 2024-03-11 MED ORDER — ADALIMUMAB-ADAZ 40 MG/0.4 ML SUBCUTANEOUS PEN INJECTOR
0 refills | 0.00000 days
Start: 2024-03-11 — End: ?

## 2024-03-13 MED ORDER — ADALIMUMAB-ADAZ 40 MG/0.4 ML SUBCUTANEOUS PEN INJECTOR
0 refills | 0.00000 days
Start: 2024-03-13 — End: ?

## 2024-03-17 MED ORDER — ADALIMUMAB-ADAZ 80 MG/0.8 ML SUBCUTANEOUS PEN INJECTOR
SUBCUTANEOUS | 4 refills | 0.00000 days | Status: CN
Start: 2024-03-17 — End: ?

## 2024-03-17 MED ORDER — ADALIMUMAB-ADAZ 40 MG/0.4 ML SUBCUTANEOUS PEN INJECTOR
SUBCUTANEOUS | 11 refills | 0.00000 days | Status: CP
Start: 2024-03-17 — End: ?

## 2024-03-20 DIAGNOSIS — L732 Hidradenitis suppurativa: Principal | ICD-10-CM

## 2024-03-20 MED ORDER — ADALIMUMAB-ADAZ 40 MG/0.4 ML SUBCUTANEOUS PEN INJECTOR
0 refills | 0.00000 days
Start: 2024-03-20 — End: ?

## 2024-03-24 ENCOUNTER — Other Ambulatory Visit (HOSPITAL_COMMUNITY): Payer: Self-pay

## 2024-03-24 DIAGNOSIS — Z6834 Body mass index (BMI) 34.0-34.9, adult: Principal | ICD-10-CM

## 2024-03-24 MED ORDER — LIRAGLUTIDE (WEIGHT LOSS) 3 MG/0.5 ML (18 MG/3 ML) SUBCUT PEN INJECTOR
SUBCUTANEOUS | 0 refills | 28.00000 days | Status: CP
Start: 2024-03-24 — End: 2024-04-21

## 2024-03-24 MED ORDER — LIRAGLUTIDE -WEIGHT MANAGEMENT 18 MG/3ML ~~LOC~~ SOPN
3.0000 mg | PEN_INJECTOR | Freq: Every day | SUBCUTANEOUS | 11 refills | Status: AC
  Filled 2024-03-25 (×2): qty 15, 30d supply, fill #0
  Filled 2024-05-07: qty 15, 30d supply, fill #1

## 2024-03-24 MED ORDER — LIRAGLUTIDE -WEIGHT MANAGEMENT 18 MG/3ML ~~LOC~~ SOPN: PEN_INJECTOR | SUBCUTANEOUS | 0 refills | Status: AC

## 2024-03-25 ENCOUNTER — Other Ambulatory Visit (HOSPITAL_COMMUNITY): Payer: Self-pay

## 2024-03-26 ENCOUNTER — Other Ambulatory Visit (HOSPITAL_COMMUNITY): Payer: Self-pay

## 2024-03-28 DIAGNOSIS — R110 Nausea: Principal | ICD-10-CM

## 2024-03-28 MED ORDER — ONDANSETRON 4 MG DISINTEGRATING TABLET
ORAL_TABLET | Freq: Every day | 0 refills | 0.00000 days | PRN
Start: 2024-03-28 — End: ?

## 2024-03-30 MED ORDER — ONDANSETRON 4 MG DISINTEGRATING TABLET
ORAL_TABLET | Freq: Every day | 0 refills | 7.00000 days | Status: CP | PRN
Start: 2024-03-30 — End: 2024-04-06

## 2024-04-21 MED ORDER — LIRAGLUTIDE (WEIGHT LOSS) 3 MG/0.5 ML (18 MG/3 ML) SUBCUT PEN INJECTOR
Freq: Every day | SUBCUTANEOUS | 11 refills | 30.00000 days | Status: CP
Start: 2024-04-21 — End: ?

## 2024-05-07 ENCOUNTER — Other Ambulatory Visit (HOSPITAL_COMMUNITY): Payer: Self-pay

## 2024-05-13 ENCOUNTER — Encounter
Admit: 2024-05-13 | Discharge: 2024-05-13 | Payer: BLUE CROSS/BLUE SHIELD | Attending: Student in an Organized Health Care Education/Training Program | Primary: Student in an Organized Health Care Education/Training Program

## 2024-05-13 DIAGNOSIS — L910 Hypertrophic scar: Secondary | ICD-10-CM

## 2024-05-13 DIAGNOSIS — L709 Acne, unspecified: Secondary | ICD-10-CM

## 2024-05-13 DIAGNOSIS — L732 Hidradenitis suppurativa: Secondary | ICD-10-CM

## 2024-05-13 DIAGNOSIS — Z79899 Other long term (current) drug therapy: Principal | ICD-10-CM

## 2024-05-13 MED ORDER — CLINDAMYCIN PHOSPHATE 1 % TOPICAL SWAB
5 refills | 0.00000 days | Status: CP
Start: 2024-05-13 — End: ?

## 2024-05-13 MED ORDER — HUMIRA PEN CITRATE FREE 40 MG/0.4 ML
SUBCUTANEOUS | 11 refills | 28.00000 days | Status: CP
Start: 2024-05-13 — End: ?

## 2024-05-13 MED ORDER — DOXYCYCLINE HYCLATE 100 MG CAPSULE
ORAL_CAPSULE | Freq: Two times a day (BID) | ORAL | 2 refills | 30.00000 days | Status: CP
Start: 2024-05-13 — End: ?
